# Patient Record
Sex: Male | Born: 1970 | Race: Black or African American | Hispanic: No | Marital: Single | Smoking: Never smoker
Health system: Southern US, Community
[De-identification: ages and names within clinical notes are randomized; demographics above are authoritative.]

## PROBLEM LIST (undated history)

## (undated) DIAGNOSIS — I10 Essential (primary) hypertension: Secondary | ICD-10-CM

## (undated) DIAGNOSIS — G4730 Sleep apnea, unspecified: Secondary | ICD-10-CM

## (undated) DIAGNOSIS — K219 Gastro-esophageal reflux disease without esophagitis: Secondary | ICD-10-CM

## (undated) DIAGNOSIS — F419 Anxiety disorder, unspecified: Secondary | ICD-10-CM

## (undated) DIAGNOSIS — E781 Pure hyperglyceridemia: Secondary | ICD-10-CM

## (undated) DIAGNOSIS — I77819 Aortic ectasia, unspecified site: Secondary | ICD-10-CM

## (undated) DIAGNOSIS — T7840XA Allergy, unspecified, initial encounter: Secondary | ICD-10-CM

## (undated) DIAGNOSIS — R000 Tachycardia, unspecified: Secondary | ICD-10-CM

## (undated) DIAGNOSIS — E119 Type 2 diabetes mellitus without complications: Secondary | ICD-10-CM

## (undated) DIAGNOSIS — K760 Fatty (change of) liver, not elsewhere classified: Secondary | ICD-10-CM

## (undated) DIAGNOSIS — I2510 Atherosclerotic heart disease of native coronary artery without angina pectoris: Secondary | ICD-10-CM

## (undated) DIAGNOSIS — I4711 Inappropriate sinus tachycardia, so stated: Secondary | ICD-10-CM

## (undated) HISTORY — DX: Pure hyperglyceridemia: E78.1

## (undated) HISTORY — DX: Anxiety disorder, unspecified: F41.9

## (undated) HISTORY — DX: Allergy, unspecified, initial encounter: T78.40XA

## (undated) HISTORY — DX: Inappropriate sinus tachycardia, so stated: I47.11

## (undated) HISTORY — PX: WISDOM TOOTH EXTRACTION: SHX21

## (undated) HISTORY — DX: Sleep apnea, unspecified: G47.30

## (undated) HISTORY — DX: Gastro-esophageal reflux disease without esophagitis: K21.9

## (undated) HISTORY — DX: Type 2 diabetes mellitus without complications: E11.9

## (undated) HISTORY — PX: COLONOSCOPY: SHX174

## (undated) HISTORY — DX: Fatty (change of) liver, not elsewhere classified: K76.0

## (undated) HISTORY — DX: Tachycardia, unspecified: R00.0

## (undated) HISTORY — DX: Atherosclerotic heart disease of native coronary artery without angina pectoris: I25.10

## (undated) HISTORY — DX: Aortic ectasia, unspecified site: I77.819

## (undated) HISTORY — DX: Morbid (severe) obesity due to excess calories: E66.01

---
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
## 2018-11-06 ENCOUNTER — Inpatient Hospital Stay (HOSPITAL_COMMUNITY)
Admission: EM | Admit: 2018-11-06 | Discharge: 2018-11-10 | DRG: 641 | Disposition: A | Discharge status: Home / Self Care | Payer: BC Managed Care – PPO | Attending: Internal Medicine | Admitting: Internal Medicine

## 2018-11-06 ENCOUNTER — Emergency Department (HOSPITAL_COMMUNITY): Payer: BC Managed Care – PPO

## 2018-11-06 ENCOUNTER — Other Ambulatory Visit: Payer: Self-pay

## 2018-11-06 DIAGNOSIS — J029 Acute pharyngitis, unspecified: Secondary | ICD-10-CM | POA: Diagnosis not present

## 2018-11-06 DIAGNOSIS — F419 Anxiety disorder, unspecified: Secondary | ICD-10-CM | POA: Diagnosis present

## 2018-11-06 DIAGNOSIS — I209 Angina pectoris, unspecified: Secondary | ICD-10-CM

## 2018-11-06 DIAGNOSIS — R079 Chest pain, unspecified: Secondary | ICD-10-CM | POA: Diagnosis present

## 2018-11-06 DIAGNOSIS — R0602 Shortness of breath: Secondary | ICD-10-CM | POA: Diagnosis not present

## 2018-11-06 DIAGNOSIS — Z8249 Family history of ischemic heart disease and other diseases of the circulatory system: Secondary | ICD-10-CM

## 2018-11-06 DIAGNOSIS — Z79899 Other long term (current) drug therapy: Secondary | ICD-10-CM

## 2018-11-06 DIAGNOSIS — R000 Tachycardia, unspecified: Secondary | ICD-10-CM | POA: Diagnosis present

## 2018-11-06 DIAGNOSIS — Z6841 Body Mass Index (BMI) 40.0 and over, adult: Secondary | ICD-10-CM

## 2018-11-06 DIAGNOSIS — N179 Acute kidney failure, unspecified: Secondary | ICD-10-CM | POA: Diagnosis present

## 2018-11-06 DIAGNOSIS — H538 Other visual disturbances: Secondary | ICD-10-CM | POA: Diagnosis present

## 2018-11-06 DIAGNOSIS — Z20828 Contact with and (suspected) exposure to other viral communicable diseases: Secondary | ICD-10-CM | POA: Diagnosis present

## 2018-11-06 DIAGNOSIS — N289 Disorder of kidney and ureter, unspecified: Secondary | ICD-10-CM

## 2018-11-06 DIAGNOSIS — I951 Orthostatic hypotension: Secondary | ICD-10-CM | POA: Diagnosis present

## 2018-11-06 DIAGNOSIS — E860 Dehydration: Secondary | ICD-10-CM | POA: Diagnosis not present

## 2018-11-06 DIAGNOSIS — I10 Essential (primary) hypertension: Secondary | ICD-10-CM | POA: Diagnosis present

## 2018-11-06 DIAGNOSIS — I2510 Atherosclerotic heart disease of native coronary artery without angina pectoris: Secondary | ICD-10-CM | POA: Diagnosis present

## 2018-11-06 HISTORY — DX: Essential (primary) hypertension: I10

## 2018-11-06 LAB — CBC
HCT: 42.9 % (ref 39.0–52.0)
Hemoglobin: 14.5 g/dL (ref 13.0–17.0)
MCH: 30.3 pg (ref 26.0–34.0)
MCHC: 33.8 g/dL (ref 30.0–36.0)
MCV: 89.7 fL (ref 80.0–100.0)
Platelets: 172 10*3/uL (ref 150–400)
RBC: 4.78 MIL/uL (ref 4.22–5.81)
RDW: 13 % (ref 11.5–15.5)
WBC: 10.6 10*3/uL — ABNORMAL HIGH (ref 4.0–10.5)
nRBC: 0 % (ref 0.0–0.2)

## 2018-11-06 IMAGING — DX CHEST  1 VIEW
2 series · 2 of 2 positions shown · non-contrast
Comparison: None.

CLINICAL DATA: Chest pain

EXAM:
CHEST  1 VIEW

[chest ap]
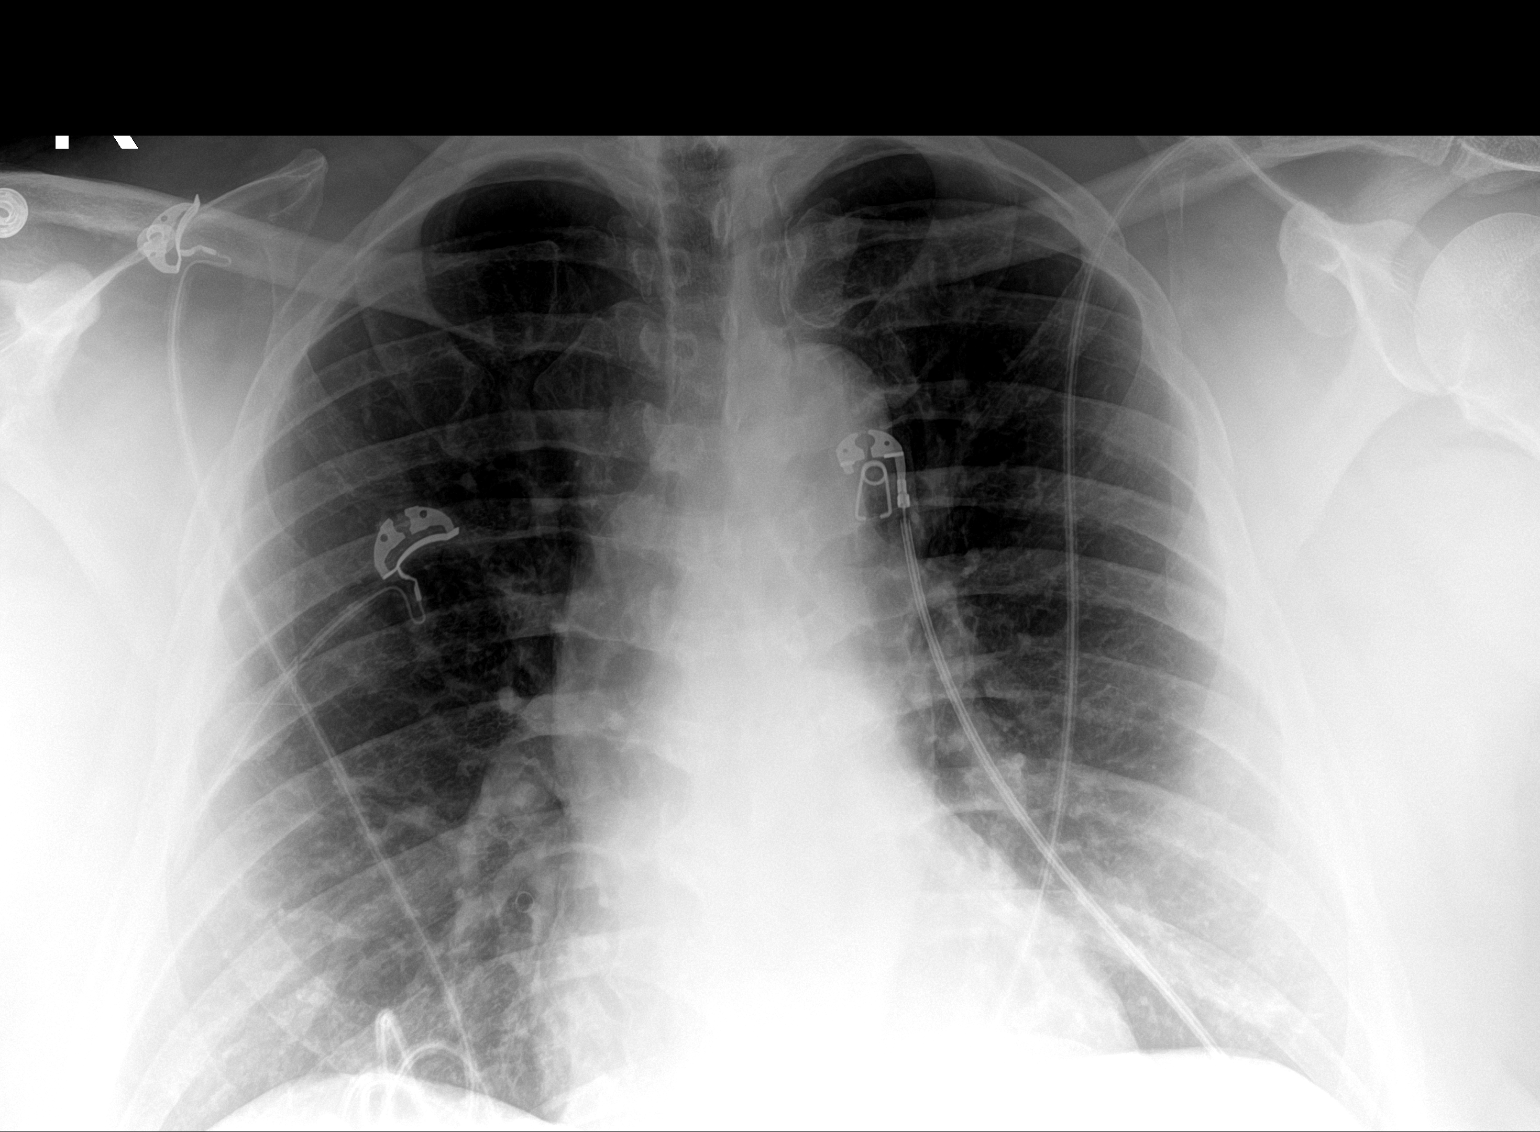

[chest pa]
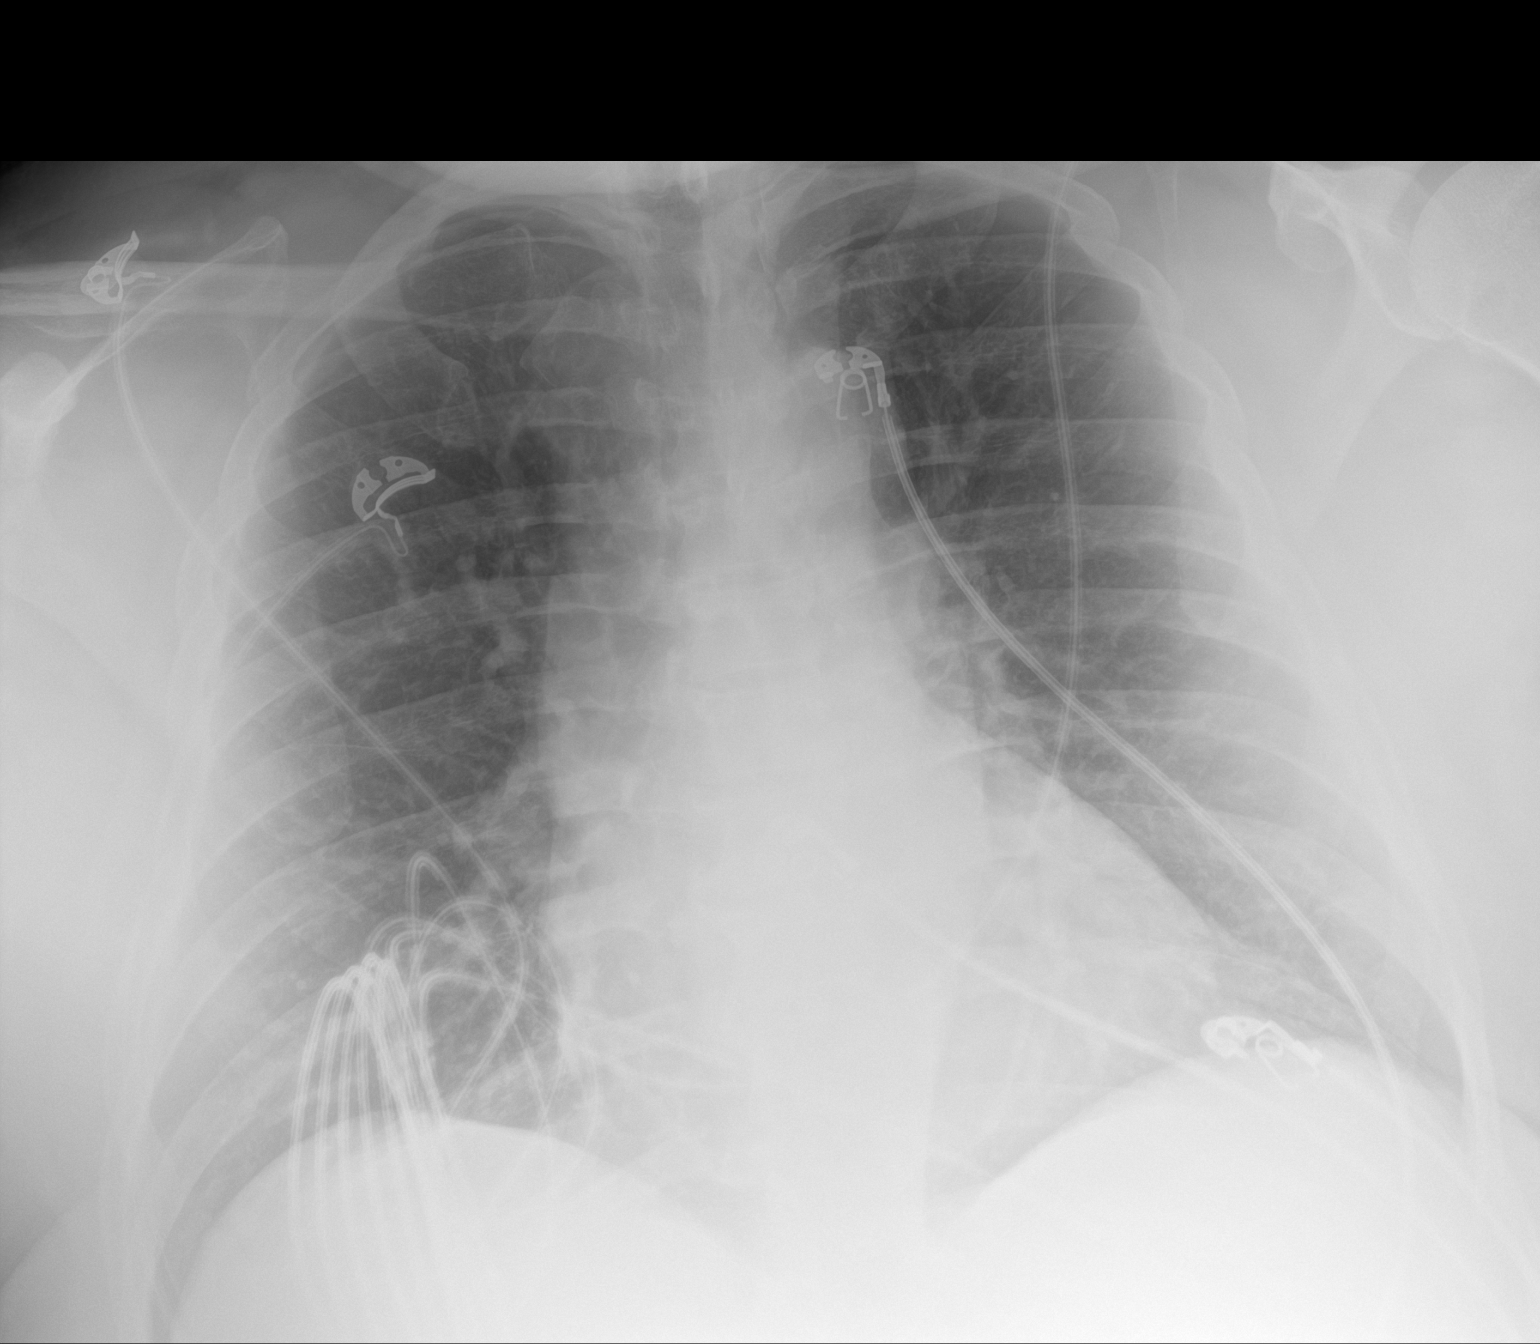

[2 of 2 positions shown; findings below may reference images not displayed]

FINDINGS: The heart size and mediastinal contours are within normal limits.
Both lungs are clear. The visualized skeletal structures are
unremarkable.
IMPRESSION: No active disease.

## 2018-11-06 MED ORDER — ADENOSINE 6 MG/2ML IV SOLN
6.0000 mg | Freq: Once | INTRAVENOUS | Status: AC
  Administered 2018-11-06: 6 mg via INTRAVENOUS
  Filled 2018-11-06: qty 2

## 2018-11-06 MED ORDER — SODIUM CHLORIDE 0.9 % IV BOLUS (SEPSIS)
500.0000 mL | Freq: Once | INTRAVENOUS | Status: AC
  Administered 2018-11-07: 500 mL via INTRAVENOUS

## 2018-11-06 MED ORDER — SODIUM CHLORIDE 0.9 % IV BOLUS (SEPSIS)
500.0000 mL | Freq: Once | INTRAVENOUS | Status: AC
  Administered 2018-11-06: 23:00:00 500 mL via INTRAVENOUS

## 2018-11-06 MED ORDER — ASPIRIN 81 MG PO CHEW
324.0000 mg | CHEWABLE_TABLET | Freq: Once | ORAL | Status: AC
  Administered 2018-11-07: 324 mg via ORAL
  Filled 2018-11-06: qty 4

## 2018-11-06 NOTE — ED Triage Notes (Signed)
Pt sts he's had some shob today then today at work had non radiating substernal chest pain. 324 ASA and 2 SLN given PTA with improvement. 140 ST with EMS. Just recently started on Cardizem.

## 2018-11-06 NOTE — ED Provider Notes (Signed)
[facility] EMERGENCY DEPARTMENT Provider Note   CSN: [id number] Arrival date & time: 11/06/18  2204    History   Chief Complaint Chief Complaint  Patient presents with  . Chest Pain    HPI Rodney Lane is a 48 y.o. male.     Patient is a 48 year old obese African-American male with past medical history of hypertension who presents the emergency department for shortness of breath and chest pain.  Patient reports over the past several weeks he has been coming more short of breath with exertion.  He did see urgent care and was started on new blood pressure medications and was supposed to have lab work-up but results are pending.  Patient reports that all of a sudden today while he was sitting in the break room at work he began to become increasingly short of breath with palpitations and chest pain.  Patient reports that it feels like someone is sitting on his chest.  Patient reports the pain is 5 out of 10.  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  Reports that with just short distances over the last couple weeks he has become more more short of breath.  Denies leg swelling or recent surgery.  No known cardiac disease but has family history     No past medical history on file.  There are no active problems to display for this patient.         Home Medications    Prior to Admission medications   Not on File    Family History No family history on file.  Social History Social History   Tobacco Use  . Smoking status: Not on file  Substance Use Topics  . Alcohol use: Not on file  . Drug use: Not on file     Allergies   Patient has no known allergies.   Review of Systems Review of Systems  Constitutional: Negative.   HENT: Negative for congestion.   Respiratory: Positive for shortness of breath. Negative for cough and chest tightness.   Cardiovascular: Positive for chest pain and palpitations. Negative for  leg swelling.  Gastrointestinal: Negative.   Musculoskeletal: Negative for arthralgias and back pain.  Skin: Negative for rash.  Allergic/Immunologic: Negative for immunocompromised state.  Neurological: Negative for dizziness, light-headedness and headaches.     Physical Exam Updated Vital Signs BP 111/63 (BP Location: Right Arm)   Pulse (!) 149   Temp 99.4 F (37.4 C) (Oral)   Resp (!) 21   Ht 5\' 9"  (1.753 m)   Wt 127 kg   SpO2 95%   BMI 41.35 kg/m   Physical Exam Constitutional:      General: He is not in acute distress.    Appearance: He is well-developed. He is obese. He is diaphoretic. He is not ill-appearing or toxic-appearing.  HENT:     Head: Normocephalic and atraumatic.  Eyes:     Pupils: Pupils are equal, round, and reactive to light.  Cardiovascular:     Rate and Rhythm: Tachycardia present.  Pulmonary:     Effort: Pulmonary effort is normal.     Breath sounds: Normal breath sounds.  Chest:     Chest wall: No mass or deformity.  Abdominal:     General: Bowel sounds are normal.     Palpations: Abdomen is soft.  Neurological:     Mental Status: He is alert.  Psychiatric:        Mood and Affect:  Mood normal.      ED Treatments / Results  Labs (all labs ordered are listed, but only abnormal results are displayed) Labs Reviewed  BASIC METABOLIC PANEL  MAGNESIUM  CBC  TROPONIN I    EKG EKG Interpretation  Date/Time:  Wednesday November 06 2018 22:14:41 EDT Ventricular Rate:  147 PR Interval:    QRS Duration: 87 QT Interval:  270 QTC Calculation: 423 R Axis:   50 Text Interpretation:  Sinus tachycardia Nonspecific ST and T wave abnormality No old tracing to compare Confirmed by Varney Biles [phone]) on 11/06/2018 10:22:16 PM   Radiology No results found.  Procedures Procedures (including critical care time)  Medications Ordered in ED Medications  sodium chloride 0.9 % bolus 500 mL (has no administration in time range)  adenosine  (ADENOCARD) 6 MG/2ML injection 6 mg (has no administration in time range)     Initial Impression / Assessment and Plan / ED Course  I have reviewed the triage vital signs and the nursing notes.  Pertinent labs & imaging results that were available during my care of the patient were reviewed by me and considered in my medical decision making (see chart for details).  Clinical Course as of Nov 05 2341  Wed Nov 06, 2018  2250 Obese male with past medical history of hypertension here for acute onset of chest pain, shortness of breath, palpitations.  Heart rate is steady at 149.  Blood pressure 119/69. Patient was given 6mg  of adenosine without any change. EKG appears more or less likely sinus tachy. Will consult with cardiology.   [KM]  2325 Consulted with cardiology who thought EKG was definitely sinus tach and no need for further antiarrythmic.    [KM]    Clinical Course User Index [KM] Alveria Apley, PA-C       Patient care handed to Montine Circle PA-C due to change of shift  Final Clinical Impressions(s) / ED Diagnoses   Final diagnoses:  None    ED Discharge Orders         Ordered    Amb referral to [facility]     11/06/18 2238           Kristine Royal 11/06/18 Nelva Bush, MD 11/07/18 1811

## 2018-11-07 ENCOUNTER — Emergency Department (HOSPITAL_COMMUNITY): Payer: BC Managed Care – PPO

## 2018-11-07 ENCOUNTER — Encounter (HOSPITAL_COMMUNITY): Payer: Self-pay | Admitting: Radiology

## 2018-11-07 ENCOUNTER — Observation Stay (HOSPITAL_BASED_OUTPATIENT_CLINIC_OR_DEPARTMENT_OTHER): Payer: BC Managed Care – PPO

## 2018-11-07 DIAGNOSIS — R0602 Shortness of breath: Secondary | ICD-10-CM

## 2018-11-07 DIAGNOSIS — R9431 Abnormal electrocardiogram [ECG] [EKG]: Secondary | ICD-10-CM

## 2018-11-07 DIAGNOSIS — N289 Disorder of kidney and ureter, unspecified: Secondary | ICD-10-CM

## 2018-11-07 DIAGNOSIS — R079 Chest pain, unspecified: Secondary | ICD-10-CM | POA: Diagnosis present

## 2018-11-07 DIAGNOSIS — I10 Essential (primary) hypertension: Secondary | ICD-10-CM | POA: Diagnosis present

## 2018-11-07 DIAGNOSIS — Z79899 Other long term (current) drug therapy: Secondary | ICD-10-CM | POA: Diagnosis not present

## 2018-11-07 DIAGNOSIS — I2510 Atherosclerotic heart disease of native coronary artery without angina pectoris: Secondary | ICD-10-CM | POA: Diagnosis present

## 2018-11-07 DIAGNOSIS — I951 Orthostatic hypotension: Secondary | ICD-10-CM | POA: Diagnosis present

## 2018-11-07 DIAGNOSIS — H538 Other visual disturbances: Secondary | ICD-10-CM | POA: Diagnosis present

## 2018-11-07 DIAGNOSIS — R000 Tachycardia, unspecified: Secondary | ICD-10-CM | POA: Diagnosis present

## 2018-11-07 DIAGNOSIS — Z20828 Contact with and (suspected) exposure to other viral communicable diseases: Secondary | ICD-10-CM | POA: Diagnosis present

## 2018-11-07 DIAGNOSIS — E860 Dehydration: Secondary | ICD-10-CM | POA: Diagnosis present

## 2018-11-07 DIAGNOSIS — Z6841 Body Mass Index (BMI) 40.0 and over, adult: Secondary | ICD-10-CM | POA: Diagnosis not present

## 2018-11-07 DIAGNOSIS — J029 Acute pharyngitis, unspecified: Secondary | ICD-10-CM | POA: Diagnosis not present

## 2018-11-07 DIAGNOSIS — N179 Acute kidney failure, unspecified: Secondary | ICD-10-CM | POA: Diagnosis present

## 2018-11-07 DIAGNOSIS — F419 Anxiety disorder, unspecified: Secondary | ICD-10-CM | POA: Diagnosis present

## 2018-11-07 DIAGNOSIS — Z8249 Family history of ischemic heart disease and other diseases of the circulatory system: Secondary | ICD-10-CM | POA: Diagnosis not present

## 2018-11-07 HISTORY — DX: Chest pain, unspecified: R07.9

## 2018-11-07 LAB — ECHOCARDIOGRAM COMPLETE
Height: 69 in
Weight: 4480 oz

## 2018-11-07 LAB — TROPONIN I
Troponin I: <0.03 ng/mL (ref <0.03)
Troponin I: <0.03 ng/mL (ref <0.03)
Troponin I: <0.03 ng/mL (ref <0.03)
Troponin I: <0.03 ng/mL (ref <0.03)

## 2018-11-07 LAB — RAPID URINE DRUG SCREEN, HOSP PERFORMED
Amphetamines: NOT DETECTED
Barbiturates: NOT DETECTED
Benzodiazepines: NOT DETECTED
Cocaine: NOT DETECTED
Opiates: NOT DETECTED
Tetrahydrocannabinol: NOT DETECTED

## 2018-11-07 LAB — PROCALCITONIN: Procalcitonin: 0.16 ng/mL

## 2018-11-07 LAB — MAGNESIUM: Magnesium: 1.9 mg/dL (ref 1.7–2.4)

## 2018-11-07 LAB — BASIC METABOLIC PANEL
Anion gap: 16 — ABNORMAL HIGH (ref 5–15)
BUN: 21 mg/dL — ABNORMAL HIGH (ref 6–20)
CO2: 20 mmol/L — ABNORMAL LOW (ref 22–32)
Calcium: 9.7 mg/dL (ref 8.9–10.3)
Chloride: 100 mmol/L (ref 98–111)
Creatinine, Ser: 1.51 mg/dL — ABNORMAL HIGH (ref 0.61–1.24)
GFR calc Af Amer: >60 mL/min (ref >60)
GFR calc non Af Amer: 54 mL/min — ABNORMAL LOW (ref >60)
Glucose, Bld: 121 mg/dL — ABNORMAL HIGH (ref 70–99)
Potassium: 4.1 mmol/L (ref 3.5–5.1)
Sodium: 136 mmol/L (ref 135–145)

## 2018-11-07 LAB — HIV ANTIBODY (ROUTINE TESTING W REFLEX): HIV Screen 4th Generation wRfx: NONREACTIVE

## 2018-11-07 LAB — TSH: TSH: 2.4 u[IU]/mL (ref 0.350–4.500)

## 2018-11-07 LAB — CREATININE, SERUM
Creatinine, Ser: 1.5 mg/dL — ABNORMAL HIGH (ref 0.61–1.24)
GFR calc Af Amer: >60 mL/min (ref >60)
GFR calc non Af Amer: 54 mL/min — ABNORMAL LOW (ref >60)

## 2018-11-07 LAB — D-DIMER, QUANTITATIVE: D-Dimer, Quant: 0.56 ug/mL-FEU — ABNORMAL HIGH (ref 0.00–0.50)

## 2018-11-07 LAB — SARS CORONAVIRUS 2: SARS Coronavirus 2: NOT DETECTED

## 2018-11-07 IMAGING — CT CT ANGIOGRAPHY CHEST
2 of 7 series · 18 of 46 positions shown · IV contrast (APPLIED)
Comparison: Portable chest [DATE].

CLINICAL DATA: 48-year-old male with chest pain shortness of breath
and diaphoresis.

EXAM:
CT ANGIOGRAPHY CHEST WITH CONTRAST
TECHNIQUE: Multidetector CT imaging of the chest was performed using the
standard protocol during bolus administration of intravenous
contrast. Multiplanar CT image reconstructions and MIPs were
obtained to evaluate the vascular anatomy.
CONTRAST:  70 milliliters OMNIPAQUE IOHEXOL 350 MG/ML SOLN

[Series 7: thins · axial · 0.91mm/px · z∈[+1268,+1565]mm · 15 of 477 slices shown]
[im 27/477  lung]
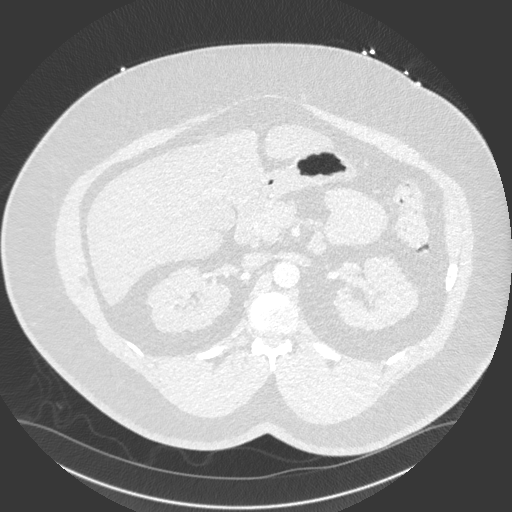
[im 53/477  soft-tissue]
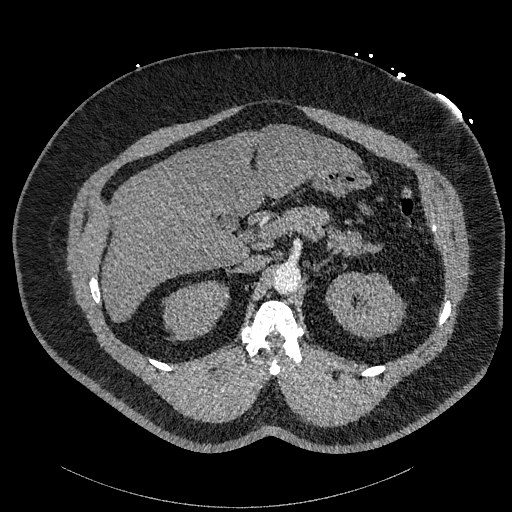
[im 80/477  lung]
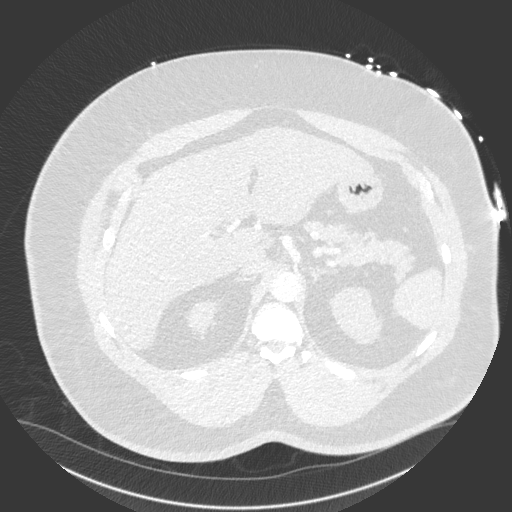
[im 106/477  soft-tissue]
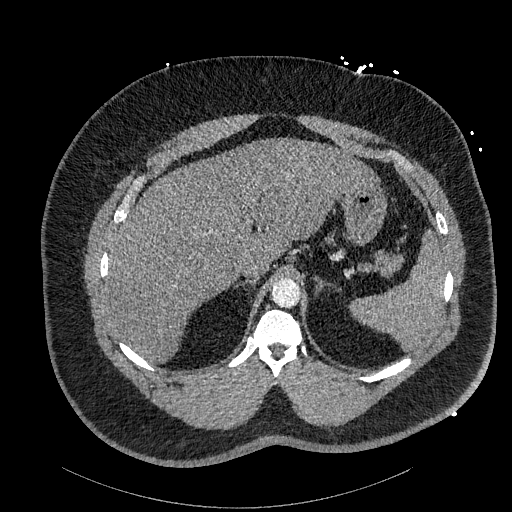
[im 159/477  lung]
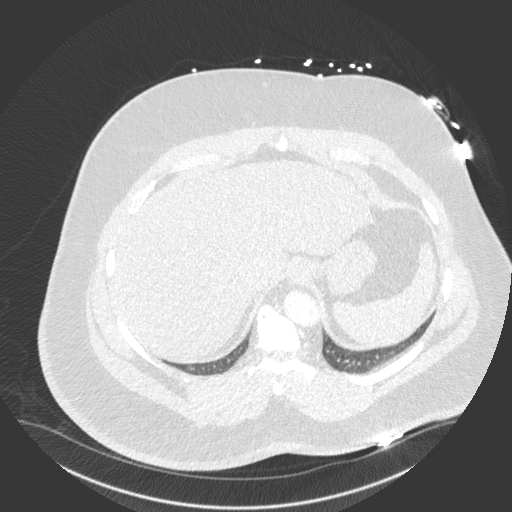
[im 186/477  soft-tissue]
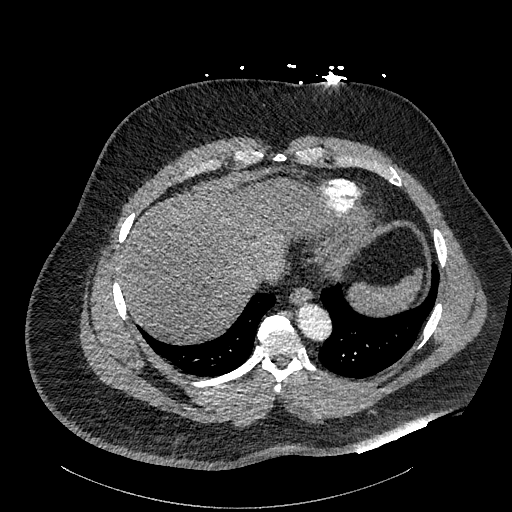
[im 212/477  lung]
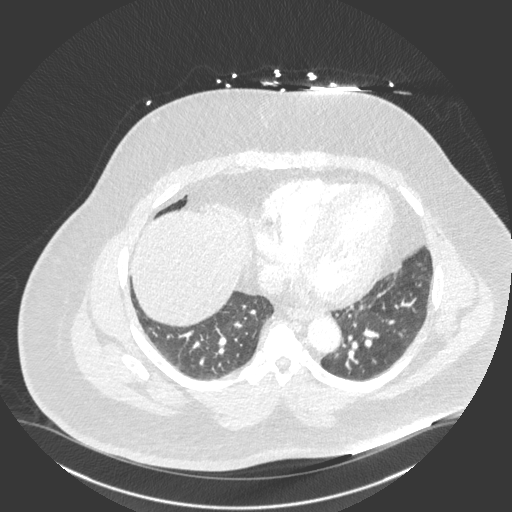
[im 239/477  soft-tissue]
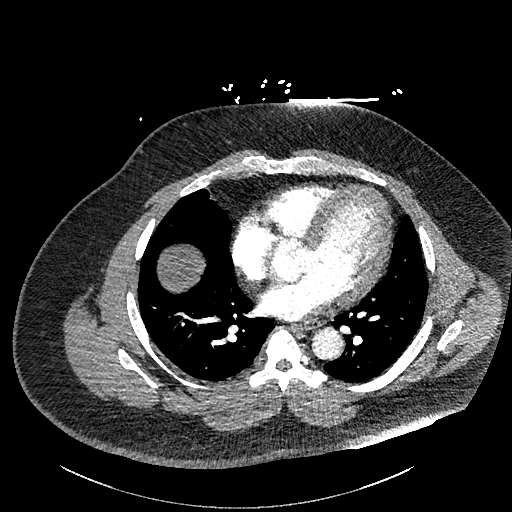
[im 265/477  lung]
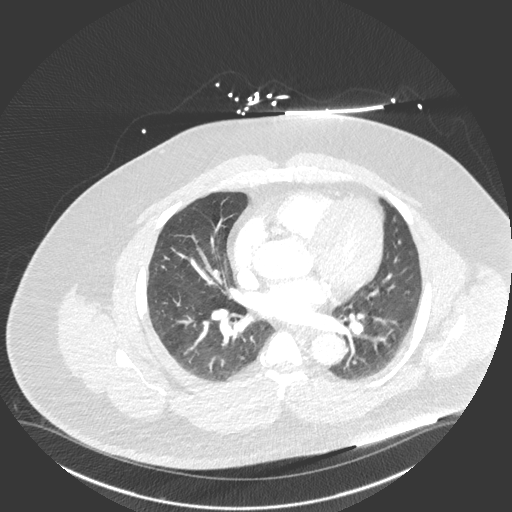
[im 291/477  soft-tissue]
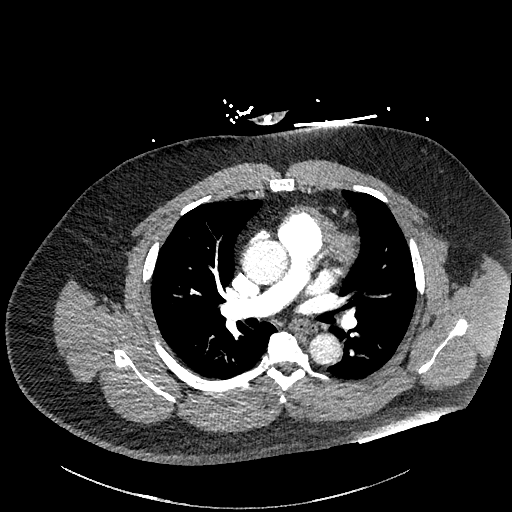
[im 318/477  lung]
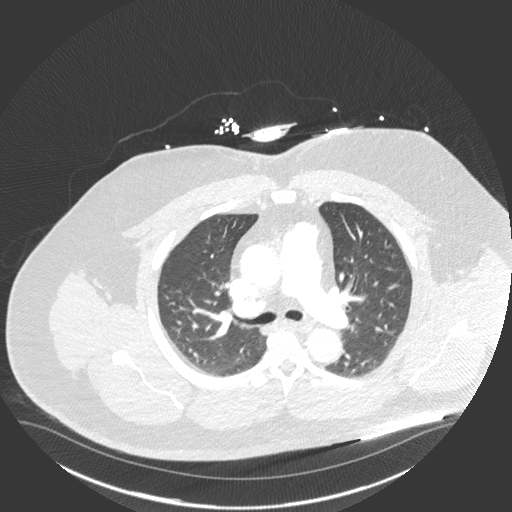
[im 371/477  soft-tissue]
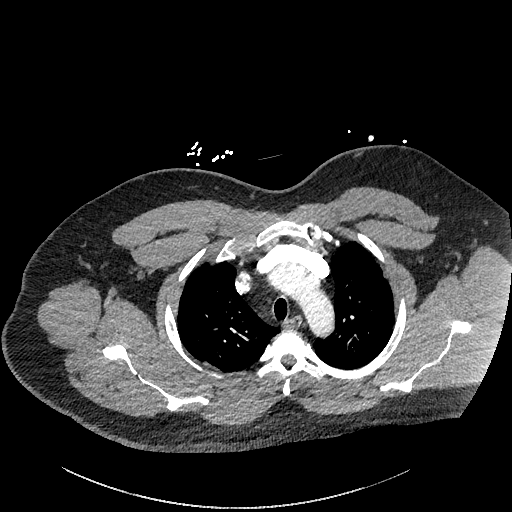
[im 397/477  lung]
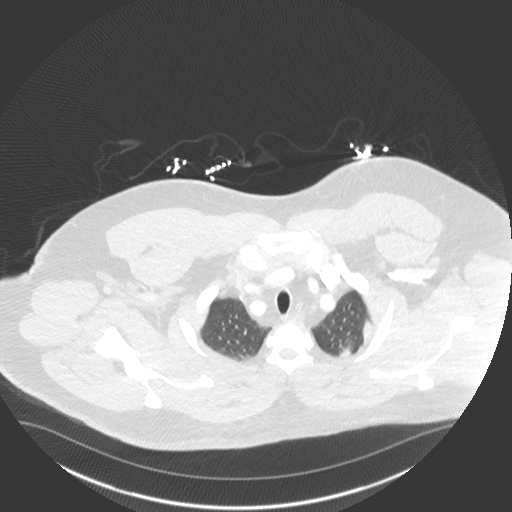
[im 424/477  soft-tissue]
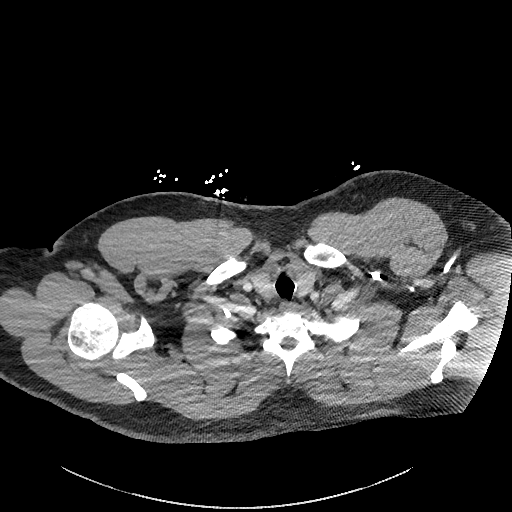
[im 450/477  lung]
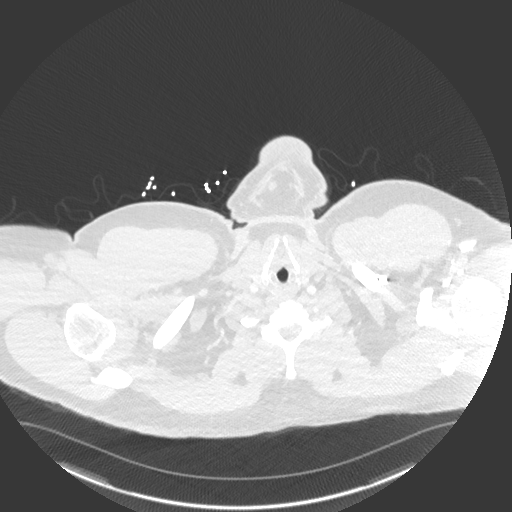

[Series 8: cor · coronal · 0.65mm/px · 3 of 192 slices shown]
[im 48/192  soft-tissue]
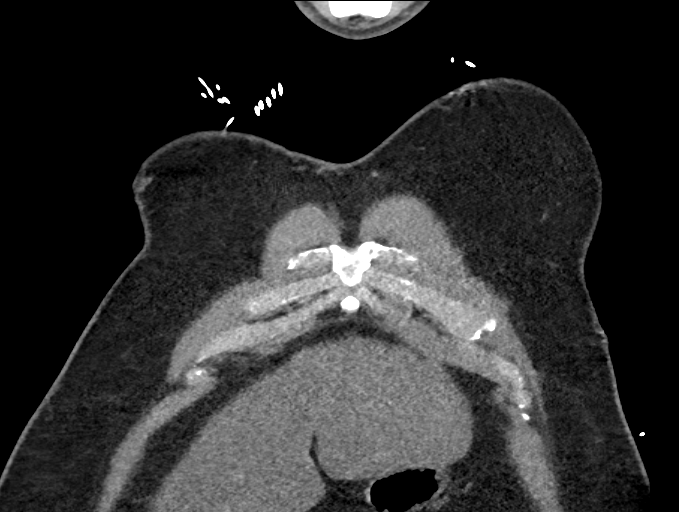
[im 96/192  soft-tissue]
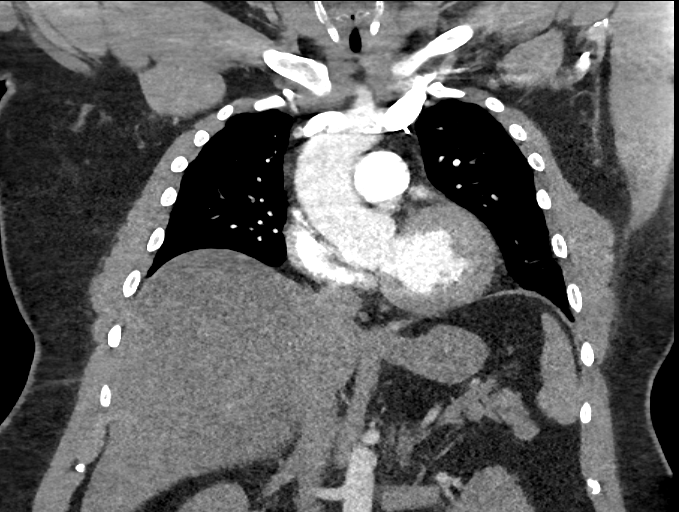
[im 144/192  soft-tissue]
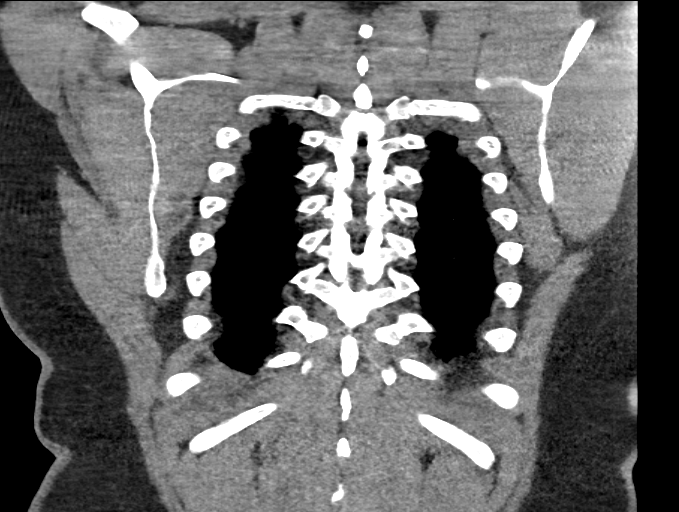

[18 of 46 positions shown; findings below may reference images not displayed]

FINDINGS: Cardiovascular: Excellent contrast bolus timing in the pulmonary
arterial tree. Mild streak artifact in the chest.

No focal filling defect identified in the pulmonary arteries to
suggest acute pulmonary embolism.

Evidence of calcified coronary artery atherosclerosis on series 7,
image 180. Mild cardiomegaly. No pericardial effusion. Negative
visible aorta.

Mediastinum/Nodes: Negative. No lymphadenopathy.

Lungs/Pleura: Major airways are patent. Minimal pulmonary
atelectasis. No pleural effusion or other abnormal pulmonary
opacity.

Upper Abdomen: Hepatic steatosis. Negative visible gallbladder,
spleen, pancreas, adrenal glands, kidneys, and bowel.

Musculoskeletal: Negative.

Review of the MIP images confirms the above findings.
IMPRESSION: 1. Negative for acute pulmonary embolus.
2. Calcified coronary artery atherosclerosis. Mild cardiomegaly. No
pericardial effusion.
3. Minimal pulmonary atelectasis.
4. Hepatic steatosis.

## 2018-11-07 MED ORDER — HEPARIN SODIUM (PORCINE) 5000 UNIT/ML IJ SOLN
5000.0000 [IU] | Freq: Three times a day (TID) | INTRAMUSCULAR | Status: DC
  Administered 2018-11-07 – 2018-11-10 (×9): 5000 [IU] via SUBCUTANEOUS
  Filled 2018-11-07 (×9): qty 1

## 2018-11-07 MED ORDER — ACETAMINOPHEN 325 MG PO TABS
650.0000 mg | ORAL_TABLET | ORAL | Status: DC | PRN
  Administered 2018-11-07: 650 mg via ORAL
  Filled 2018-11-07: qty 2

## 2018-11-07 MED ORDER — ONDANSETRON HCL 4 MG/2ML IJ SOLN: 4.0000 mg | Freq: Four times a day (QID) | INTRAMUSCULAR | Status: DC | PRN

## 2018-11-07 MED ORDER — MORPHINE SULFATE (PF) 2 MG/ML IV SOLN: 2.0000 mg | INTRAVENOUS | Status: DC | PRN

## 2018-11-07 MED ORDER — DILTIAZEM HCL ER COATED BEADS 120 MG PO TB24
120.0000 mg | ORAL_TABLET | Freq: Every day | ORAL | Status: DC
  Administered 2018-11-07: 120 mg via ORAL
  Filled 2018-11-07 (×2): qty 1

## 2018-11-07 MED ORDER — SODIUM CHLORIDE 0.9 % IV BOLUS
1000.0000 mL | Freq: Once | INTRAVENOUS | Status: AC
  Administered 2018-11-06: 23:00:00 1000 mL via INTRAVENOUS

## 2018-11-07 MED ORDER — IOHEXOL 350 MG/ML SOLN
100.0000 mL | Freq: Once | INTRAVENOUS | Status: AC | PRN
  Administered 2018-11-07: 01:00:00 100 mL via INTRAVENOUS

## 2018-11-07 MED ORDER — METOPROLOL TARTRATE 12.5 MG HALF TABLET: 12.5000 mg | ORAL_TABLET | Freq: Two times a day (BID) | ORAL | Status: DC

## 2018-11-07 MED ORDER — METOPROLOL TARTRATE 50 MG PO TABS
50.0000 mg | ORAL_TABLET | Freq: Two times a day (BID) | ORAL | Status: DC
  Administered 2018-11-07 – 2018-11-08 (×2): 50 mg via ORAL
  Filled 2018-11-07 (×3): qty 1

## 2018-11-07 MED ORDER — NITROGLYCERIN 0.1 MG/HR TD PT24
0.1000 mg | MEDICATED_PATCH | Freq: Every day | TRANSDERMAL | Status: DC
  Administered 2018-11-07 – 2018-11-08 (×2): 0.1 mg via TRANSDERMAL
  Filled 2018-11-07 (×2): qty 1

## 2018-11-07 NOTE — ED Notes (Signed)
Spoke with cardiology who stated to go ahead and order pt a diet tray, not likely to go for stress test today.

## 2018-11-07 NOTE — Consult Note (Addendum)
Cardiology Consultation:   Patient ID: Rodney Lane; [id number]; [birth year]   Admit date: 11/06/2018 Date of Consult: 11/07/2018  Primary Care Provider: System, Pcp Not In Primary Cardiologist: No primary care provider on file. Primary Electrophysiologist:  None   Patient Profile:   Rodney Lane is a 48 y.o. male with a PMH of HTN, obesity, and tachycardia managed with diltiazem, who is being seen today for the evaluation of tachycardia and chest pain at the request of Dr. Earnest Conroy.  History of Present Illness:   Mr. Rabideau was in his usual state of health until a couple weeks ago when he began experiencing DOE. He sought care an an urgent care facility and was found to be hypertensive and started on amlodipine for BP control. His symptoms continued and he was noted to have sinus tachycardia for which he was prescribed diltiazem. On 11/06/2018 when he developed sudden onset shortness of breath, palpitations, and chest pressure while sitting in the break room at work. He reported associated diaphoresis. EMS was activated and he states his SBP was elevated to the 160s. He was brought to the ED for further evaluation.    He denies prior heart disease history. He states he has had trouble with his cholesterol and pre-DM, though he is not on any medications and has been managing these with dietary modifications. He reports both grandfathers died from MI's (one in his 63s, one in his 33s), otherwise no significant family history of CAD. He reported having a similar experience with SOB and CP a couple years ago which was evaluated at the hospital in [place]. He underwent a stress test and an echocardiogram at that time which he reports were normal. He reported having a bad reaction to the lexiscan medication and states he would not want to go through that again.   At the time of this evaluation, he continues to feel some chest pressure which is improved with the nitro patch. He denies current  palpitations or feeling his heart race, though his HR was in the 120s at that time. He states his DOE has progressed over the past couple weeks and he has to stop for rest even when walking to his mailbox. He reports intermittent chest pressure which occurs once per week, lasts for a couple minutes, and resolves spontaneously. He denies orthopnea, PND, LE edema, weight gain, dizziness, lightheadedness, or syncope.   Hospital course: tachycardic to the 140s on arrival which improved to 110s after 2L NS, intermittently tachypneic, and intermittently hypertensive; otherwise VSS. Labs notable for electrolytes wnl, Cr 1.5 (no baseline), WBC 10.6, Hgb 14.5, PLT 172, TSH 2.4, Ddimer 0.56, Utox negative, COVID-19 negative, procal 0.16. CTA Chest without PE or aortic dissection. CXR without acute findings. EKG with sinus tachycardia, rate 147, non-specific T wave abnormalities, no STE/D, no TWI. Echo pending. Trial adenosine in the ED without change in HR. HR improved to the 110s after 2L IVF. He was admitted to medicine and started on a nitro patch given persistent chest pain. Cardiology asked to evaluate  Past Medical History:  Diagnosis Date  . HTN (hypertension)   . Tachycardia    Not sure why it goes fast, but someone put him on cardizem.  Appears to be S.tach    History reviewed. No pertinent surgical history.   Home Medications:  Prior to Admission medications   Medication Sig Start Date End Date Taking? Authorizing Provider  albuterol (VENTOLIN HFA) 108 (90 Base) MCG/ACT inhaler Inhale 1-2 puffs into the lungs every 6 (  six) hours as needed for wheezing or shortness of breath.   Yes [provider]  amLODipine (NORVASC) 5 MG tablet Take 5 mg by mouth daily.  10/25/18  Yes [provider]  CARDIZEM LA 120 MG 24 hr tablet Take 120 mg by mouth daily. 11/01/18  Yes [provider]  cetirizine (ZYRTEC) 10 MG tablet Take 10 mg by mouth daily. 10/25/18  Yes [provider]   hydrochlorothiazide (HYDRODIURIL) 25 MG tablet Take 25 mg by mouth daily. 10/25/18  Yes [provider]    Inpatient Medications: Scheduled Meds: . diltiazem  120 mg Oral Daily  . heparin  5,000 Units Subcutaneous Q8H  . nitroGLYCERIN  0.1 mg Transdermal Daily   Continuous Infusions:  PRN Meds: acetaminophen, morphine injection, ondansetron (ZOFRAN) IV  Allergies:   No Known Allergies  Social History:   Social History   Socioeconomic History  . Marital status: Single    Spouse name: Not on file  . Number of children: Not on file  . Years of education: Not on file  . Highest education level: Not on file  Occupational History  . Not on file  Social Needs  . Financial resource strain: Not on file  . Food insecurity    Worry: Not on file    Inability: Not on file  . Transportation needs    Medical: Not on file    Non-medical: Not on file  Tobacco Use  . Smoking status: Never Smoker  Substance and Sexual Activity  . Alcohol use: Never    Frequency: Never  . Drug use: Never  . Sexual activity: Not on file  Lifestyle  . Physical activity    Days per week: Not on file    Minutes per session: Not on file  . Stress: Not on file  Relationships  . Social Herbalist on phone: Not on file    Gets together: Not on file    Attends religious service: Not on file    Active member of club or organization: Not on file    Attends meetings of clubs or organizations: Not on file    Relationship status: Not on file  . Intimate partner violence    Fear of current or ex partner: Not on file    Emotionally abused: Not on file    Physically abused: Not on file    Forced sexual activity: Not on file  Other Topics Concern  . Not on file  Social History Narrative  . Not on file    Family History:    Family History  Problem Relation Age of Onset  . Cancer Mother   . Cancer Father   . Diabetes Father   . Heart disease Maternal Grandfather   . Heart disease  Paternal Grandfather      ROS:  Please see the history of present illness.   All other ROS reviewed and negative.     Physical Exam/Data:   Vitals:   11/07/18 1030 11/07/18 1045 11/07/18 1125 11/07/18 1300  BP: (!) 134/92  131/76 107/71  Pulse: (!) 112 (!) 110 (!) 116 (!) 104  Resp: (!) 29 (!) 23 (!) 22 (!) 34  Temp:      TempSrc:      SpO2: 96% 96% 98% 93%  Weight:      Height:        Intake/Output Summary (Last 24 hours) at 11/07/2018 1334 Last data filed at 11/07/2018 0250 Gross per 24 hour  Intake 1900 ml  Output -  Net 1900 ml   Filed Weights   11/06/18 2208 11/06/18 2216  Weight: 127 kg 127 kg   Body mass index is 41.35 kg/m.  General:  Well nourished, well developed, sitting on the edge of the bed in no acute distress HEENT: sclera anicteric  Neck: no JVD Vascular: No carotid bruits; distal pulses 2+ bilaterally Cardiac:  normal S1, S2; tachycardic, regular rhythm, no murmurs, rubs, or gallops Lungs:  clear to auscultation bilaterally, no wheezing, rhonchi or rales  Abd: NABS, soft, obese, nontender, no hepatomegaly Ext: no edema Musculoskeletal:  No deformities, BUE and BLE strength normal and equal Skin: warm and dry  Neuro:  CNs 2-12 intact, no focal abnormalities noted Psych:  Normal affect   EKG:  The EKG was personally reviewed and demonstrates:  sinus tachycardia, rate 147, non-specific T wave abnormalities, no STE/D, no TWI. Telemetry:  Telemetry was personally reviewed and demonstrates:  Sinus tachycardia with rate 110s-120s  Relevant CV Studies: Echocardiogram 11/07/2018:  IMPRESSIONS    1. The left ventricle has low normal systolic function, with an ejection fraction of 50-55%. The cavity size was normal. There is mildly increased left ventricular wall thickness. Left ventricular diastolic Doppler parameters are consistent with  impaired relaxation.  2. The right ventricle has normal systolic function. The cavity was normal. There is no  increase in right ventricular wall thickness.  3. The aortic valve was not well visualized.  Laboratory Data:  Chemistry Recent Labs  Lab 11/06/18 2249 11/07/18 0622  NA 136  --   K 4.1  --   CL 100  --   CO2 20*  --   GLUCOSE 121*  --   BUN 21*  --   CREATININE 1.51* 1.50*  CALCIUM 9.7  --   GFRNONAA 54* 54*  GFRAA >60 >60  ANIONGAP 16*  --     No results for input(s): PROT, ALBUMIN, AST, ALT, ALKPHOS, BILITOT in the last 168 hours. Hematology Recent Labs  Lab 11/06/18 2249  WBC 10.6*  RBC 4.78  HGB 14.5  HCT 42.9  MCV 89.7  MCH 30.3  MCHC 33.8  RDW 13.0  PLT 172   Cardiac Enzymes Recent Labs  Lab 11/06/18 2249 11/07/18 0523 11/07/18 1044  TROPONINI <0.03 <0.03 <0.03   No results for input(s): TROPIPOC in the last 168 hours.  BNPNo results for input(s): BNP, PROBNP in the last 168 hours.  DDimer  Recent Labs  Lab 11/06/18 2314  DDIMER 0.56*    Radiology/Studies:  Dg Chest 1 View  Result Date: 11/06/2018 CLINICAL DATA:  Chest pain EXAM: CHEST  1 VIEW COMPARISON:  None. FINDINGS: The heart size and mediastinal contours are within normal limits. Both lungs are clear. The visualized skeletal structures are unremarkable. IMPRESSION: No active disease. Electronically Signed   By: Donavan Foil M.D.   On: 11/06/2018 22:56   Ct Angio Chest Pe W And/or Wo Contrast  Result Date: 11/07/2018 CLINICAL DATA:  48 year old male with chest pain shortness of breath and diaphoresis. EXAM: CT ANGIOGRAPHY CHEST WITH CONTRAST TECHNIQUE: Multidetector CT imaging of the chest was performed using the standard protocol during bolus administration of intravenous contrast. Multiplanar CT image reconstructions and MIPs were obtained to evaluate the vascular anatomy. CONTRAST:  70 milliliters OMNIPAQUE IOHEXOL 350 MG/ML SOLN COMPARISON:  Portable chest 11/06/2018. FINDINGS: Cardiovascular: Excellent contrast bolus timing in the pulmonary arterial tree. Mild streak artifact in the  chest. No focal filling defect identified in the  pulmonary arteries to suggest acute pulmonary embolism. Evidence of calcified coronary artery atherosclerosis on series 7, image 180. Mild cardiomegaly. No pericardial effusion. Negative visible aorta. Mediastinum/Nodes: Negative. No lymphadenopathy. Lungs/Pleura: Major airways are patent. Minimal pulmonary atelectasis. No pleural effusion or other abnormal pulmonary opacity. Upper Abdomen: Hepatic steatosis. Negative visible gallbladder, spleen, pancreas, adrenal glands, kidneys, and bowel. Musculoskeletal: Negative. Review of the MIP images confirms the above findings. IMPRESSION: 1. Negative for acute pulmonary embolus. 2. Calcified coronary artery atherosclerosis. Mild cardiomegaly. No pericardial effusion. 3. Minimal pulmonary atelectasis. 4. Hepatic steatosis. Electronically Signed   By: Genevie Ann M.D.   On: 11/07/2018 01:05    Assessment and Plan:   1. Chest pain and DOE: He reports intermittent chest pressure which occurs once per week typically lasting a couple minutes before resolving. Also with progressive DOE for the past couple weeks. On 11/06/2018 he had an episode with associated SOB, diaphoresis, and palpitations, which lasted for hours. Trop negative x3. EKG with sinus tachycardia but non-ischemic. Echo with EF 50-55%, mild LVH, G1DD, and no significant valvular abnormalities. No prior heart disease history but was noted to have calcified coronary artery atherosclerosis on CT chest. Risk factors for CAD include HTN, presumed HLD (reports problems in the past but not on statin), presumed DM type 2 (pre-DM in past and glucose elevated this admission), suspected OSA, and obesity.  - Check HgbA1C and FLP for risk stratification - Would likely benefit from an ischemic evaluation. Patient is not interested in undergoing a NST given past experience. His body habitus is not conducive to coronary CTA. Favor holding off on invasive ischemic evaluation.  Could consider an exercise stress test once HR better controlled.   2. Sinus tachycardia: unclear etiology. Not responsive to adenosine. No evidence of infection on CXR or CT Chest. Utox negative. TSH wnl. Improved from 140s to 120s with IVFs. Had been on diltiazem outpatient. Possible he has an atrial tachycardia that originates from near the SA node.  - Psychiatrist for BP/HR control - will start metoprolol 68m BID and uptitrate as tolerated.  - Will check ESR, CPR, and ANA - Will ask EP to see - may benefit from an EP study to further evaluate tachycardia.    3. HTN: BP stable. Home amlodipine and HCTZ on hold.  - Will start metoprolol 556mBID - Further recommendations pending response to metoprolol.   4. AKI vs CKD: Cr 1.5 on presentation - no baseline history available   For questions or updates, please contact [facility] consult [email or web address] for contact info under Cardiology/STEMI.   Signed, KrAbigail ButtsPA-C  11/07/2018 1:34 PM [phone] examined chart reviewed Discussed care with patient and PA. Issue appears to be inappropriate "sinus" tachycardia. Labs are fine Hct, TSH, urine drug screen normal. Echo with low normal EF Mitral inflow pattern shows abnormal relaxation with fused E/A waves. P wave morphology on ECG is normal. No change in rhythm with adenosine in ER speaks against re-entrant rhythm. No changes on my exam with valsalva or CSM. He is obese and sedentary and may just be extreme end of bell shaped curve for normal. However I have seen abnormal ectopic atrial tachycardia with location near the normal SA node that presents like this. Diagnosis would require EP study. Have asked EP to see For now d/c calcium blockers Start lopressor 50 bid If high dose beta blocker does not help can consider Corlanor Does not need ischemic evaluation for now. R/O no acute  changes pain in chest is atypical Check ESR, ANR, CRF He has had pharmacologic  stress test in passed and will not have again due to severe reaction to ? lexiscan or adenosine HR too high for cardiac CT and clinical presentation does not warrant invasive cath. Can consider ETT/myovue when COVID restrictions lifted   [employer]

## 2018-11-07 NOTE — ED Notes (Signed)
PT IS REFUSING RECTAL TEMPERATURE.

## 2018-11-07 NOTE — ED Notes (Signed)
Phlebotomy at bedside.

## 2018-11-07 NOTE — Progress Notes (Signed)
Please see H&P from this morning. 48 y.o. male with medical history significant of HTN, obesity, tachycardia being treated with cardizem by PCP.Patient presents to the ED with c/o SOB and CP. HR in 150s, received adenosine revealing sinus tach. Now HR 105-110. Patient reports several weeks of increasing exertional dyspnea relieved by rest. Unable to walk from mailbox to home without taking a break. States had a negative stress test many years back. Patient still in ED awaiting bed assignment. States chest pain now 2/10, retrosternal, non radiating, not associated with nausea/voming. EKG NSR and trop x2 -ve. ?Angina equivalent. Cards consult pending. Echo ordered. TSH WNL. Continue asa, diltiazem . Add nitrates. Never had sleep study. +Obese abdomen. Creatinine 1.5 on presentation. ?new. HCTZ Josem Kaufmann held. SBP 120-150. Add NTP. Heparin sq for DVT prophylaxis.

## 2018-11-07 NOTE — ED Notes (Signed)
Admitting at bedside 

## 2018-11-07 NOTE — Progress Notes (Signed)
  Echocardiogram 2D Echocardiogram has been performed.  Rodney Lane 11/07/2018, 11:24 AM

## 2018-11-07 NOTE — ED Notes (Signed)
ECHO at bedside.

## 2018-11-07 NOTE — H&P (Signed)
History and Physical    Rodney Lane PPJ:[id number] DOB: 02/10/1971 DOA: 11/06/2018  PCP: System, Pcp Not In  Patient coming from: Home  I have personally briefly reviewed patient's old medical records in [place]  Chief Complaint: CP  HPI: Rodney Lane is a 48 y.o. male with medical history significant of HTN, obesity, Tachycardia being treated with cardizem by PCP.  Patient presents to the ED with c/o SOB and CP.  Patient reports several weeks of increasing DOE.  Saw UC / PCP, got started on cardizem it looks like.  Patient reports that all of a sudden today while he was sitting in the break room at work he began to become increasingly short of breath with palpitations and chest pain.  Patient reports that it feels like someone is sitting on his chest.  Patient reports the pain is 5 out of 10.  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  Reports that with just short distances over the last couple weeks he has become more more short of breath.  Denies leg swelling or recent surgery.   ED Course: Initially HR 150s S.Tach.  Adenosine attempted without any effect.  Patient given NTG, ASA, 2L NS bolus with improvement in HR now down to 110-120 and improvement in CP, now only intermittent.  Trop neg x1.  D.Dimer 0.52.  CTA chest neg for PE and visible aorta neg.  Also no pericardial effusion or other obvious acute findings to explain his presentation though he does have coronary artery calcifications.  TSH is nl.  COVID neg, no fever, WBC 10.6k, no obvious source of infection.   Review of Systems: As per HPI otherwise 10 point review of systems negative.   Past Medical History:  Diagnosis Date  . HTN (hypertension)   . Tachycardia    Not sure why it goes fast, but someone put him on cardizem.  Appears to be S.tach    History reviewed. No pertinent surgical history.   reports that he has never smoked. He does not have any smokeless tobacco  history on file. He reports that he does not drink alcohol or use drugs.  No Known Allergies  Family History  Problem Relation Age of Onset  . Cancer Mother   . Cancer Father   . Diabetes Father   . Heart disease Maternal Grandfather   . Heart disease Paternal Grandfather      Prior to Admission medications   Medication Sig Start Date End Date Taking? Authorizing Provider  albuterol (VENTOLIN HFA) 108 (90 Base) MCG/ACT inhaler Inhale 1-2 puffs into the lungs every 6 (six) hours as needed for wheezing or shortness of breath.   Yes [provider]  amLODipine (NORVASC) 5 MG tablet Take 5 mg by mouth daily.  10/25/18  Yes [provider]  CARDIZEM LA 120 MG 24 hr tablet Take 120 mg by mouth daily. 11/01/18  Yes [provider]  cetirizine (ZYRTEC) 10 MG tablet Take 10 mg by mouth daily. 10/25/18  Yes [provider]  hydrochlorothiazide (HYDRODIURIL) 25 MG tablet Take 25 mg by mouth daily. 10/25/18  Yes [provider]    Physical Exam: Vitals:   11/07/18 0316 11/07/18 0347 11/07/18 0455 11/07/18 0500  BP: 116/90 126/82 135/87 119/81  Pulse: (!) 120 (!) 121 (!) 116 (!) 117  Resp: (!) 24 (!) 27 (!) 24 (!) 23  Temp: 98.6 F (37 C)     TempSrc: Oral  SpO2: 98% 94% 97% 96%  Weight:      Height:        Constitutional: NAD, calm, comfortable Eyes: PERRL, lids and conjunctivae normal ENMT: Mucous membranes are moist. Posterior pharynx clear of any exudate or lesions.Normal dentition.  Neck: normal, supple, no masses, no thyromegaly Respiratory: clear to auscultation bilaterally, no wheezing, no crackles. Normal respiratory effort. No accessory muscle use.  Cardiovascular: Tachycardic Abdomen: no tenderness, no masses palpated. No hepatosplenomegaly. Bowel sounds positive.  Musculoskeletal: no clubbing / cyanosis. No joint deformity upper and lower extremities. Good ROM, no contractures. Normal muscle tone.  Skin: no rashes, lesions,  ulcers. No induration Neurologic: CN 2-12 grossly intact. Sensation intact, DTR normal. Strength 5/5 in all 4.  Psychiatric: Normal judgment and insight. Alert and oriented x 3. Normal mood.    Labs on Admission: I have personally reviewed following labs and imaging studies  CBC: Recent Labs  Lab 11/06/18 2249  WBC 10.6*  HGB 14.5  HCT 42.9  MCV 89.7  PLT 465   Basic Metabolic Panel: Recent Labs  Lab 11/06/18 2249  NA 136  K 4.1  CL 100  CO2 20*  GLUCOSE 121*  BUN 21*  CREATININE 1.51*  CALCIUM 9.7  MG 1.9   GFR: Estimated Creatinine Clearance: 78.9 mL/min (A) (by C-G formula based on SCr of 1.51 mg/dL (H)). Liver Function Tests: No results for input(s): AST, ALT, ALKPHOS, BILITOT, PROT, ALBUMIN in the last 168 hours. No results for input(s): LIPASE, AMYLASE in the last 168 hours. No results for input(s): AMMONIA in the last 168 hours. Coagulation Profile: No results for input(s): INR, PROTIME in the last 168 hours. Cardiac Enzymes: Recent Labs  Lab 11/06/18 2249  TROPONINI <0.03   BNP (last 3 results) No results for input(s): PROBNP in the last 8760 hours. HbA1C: No results for input(s): HGBA1C in the last 72 hours. CBG: No results for input(s): GLUCAP in the last 168 hours. Lipid Profile: No results for input(s): CHOL, HDL, LDLCALC, TRIG, CHOLHDL, LDLDIRECT in the last 72 hours. Thyroid Function Tests: Recent Labs    11/06/18 2313  TSH 2.400   Anemia Panel: No results for input(s): VITAMINB12, FOLATE, FERRITIN, TIBC, IRON, RETICCTPCT in the last 72 hours. Urine analysis: No results found for: COLORURINE, APPEARANCEUR, LABSPEC, PHURINE, GLUCOSEU, HGBUR, BILIRUBINUR, KETONESUR, PROTEINUR, UROBILINOGEN, NITRITE, LEUKOCYTESUR  Radiological Exams on Admission: Dg Chest 1 View  Result Date: 11/06/2018 CLINICAL DATA:  Chest pain EXAM: CHEST  1 VIEW COMPARISON:  None. FINDINGS: The heart size and mediastinal contours are within normal limits. Both lungs  are clear. The visualized skeletal structures are unremarkable. IMPRESSION: No active disease. Electronically Signed   By: Donavan Foil M.D.   On: 11/06/2018 22:56   Ct Angio Chest Pe W And/or Wo Contrast  Result Date: 11/07/2018 CLINICAL DATA:  48 year old male with chest pain shortness of breath and diaphoresis. EXAM: CT ANGIOGRAPHY CHEST WITH CONTRAST TECHNIQUE: Multidetector CT imaging of the chest was performed using the standard protocol during bolus administration of intravenous contrast. Multiplanar CT image reconstructions and MIPs were obtained to evaluate the vascular anatomy. CONTRAST:  70 milliliters OMNIPAQUE IOHEXOL 350 MG/ML SOLN COMPARISON:  Portable chest 11/06/2018. FINDINGS: Cardiovascular: Excellent contrast bolus timing in the pulmonary arterial tree. Mild streak artifact in the chest. No focal filling defect identified in the pulmonary arteries to suggest acute pulmonary embolism. Evidence of calcified coronary artery atherosclerosis on series 7, image 180. Mild cardiomegaly. No pericardial effusion. Negative visible aorta. Mediastinum/Nodes: Negative. No lymphadenopathy.  Lungs/Pleura: Major airways are patent. Minimal pulmonary atelectasis. No pleural effusion or other abnormal pulmonary opacity. Upper Abdomen: Hepatic steatosis. Negative visible gallbladder, spleen, pancreas, adrenal glands, kidneys, and bowel. Musculoskeletal: Negative. Review of the MIP images confirms the above findings. IMPRESSION: 1. Negative for acute pulmonary embolus. 2. Calcified coronary artery atherosclerosis. Mild cardiomegaly. No pericardial effusion. 3. Minimal pulmonary atelectasis. 4. Hepatic steatosis. Electronically Signed   By: Genevie Ann M.D.   On: 11/07/2018 01:05    EKG: Independently reviewed.  Assessment/Plan Principal Problem:   Chest pain, rule out acute myocardial infarction Active Problems:   Renal insufficiency   HTN (hypertension)   Sinus tachycardia    1. CP r/o - 1. Unusual  presentation with S.Tach to the 150s initially, now improved to the 110s after 2. CP improved and now only intermittent post NTG and ASA 3. CTA neg for PE or dissection or PNA or pericardial effusion or other acute abnormality 4. CP obs pathway 5. Serial trops 6. Tele monitor 7. NPO 8. Message sent to P. Trent for cards eval in AM 9. 2d Echo as next step? Will defer to cards though 10. PRN morphine ordered 2. S.Tach - 1. Unclear cause, but looks like hes had this on recent office visits, and PCP started him on Cardizem for this. 2. Will continue cardizem for now 3. If needed, then add beta blocker 4. TSH nl 5. Check procalcitonin 6. Cards eval 3. HTN - 1. Continue cardizem 2. Holding HCTZ and amlodipine 3. If BP rises, then would add beta blocker as next step to get better control of HR 4. Renal insufficiency - 1. Apparently acute.  Looks like creat was 1.02 at PCPs office on 5/13 2. 2L NS in ED 3. Repeat tomorrow AM  DVT prophylaxis: Lovenox Code Status: Full Family Communication: No family in room Disposition Plan: Home after admit Consults called: Message sent to P. Abagail Kitchens for routine IP cards eval Admission status: Place in obs    , [place] [facility]  How to contact the [facility] Attending or Consulting provider [place] or covering provider during after hours Candelaria Arenas, for this patient?  1. Check the care team in [facility] and look for a) attending/consulting [facility] provider listed and b) the [facility] team listed 2. Log into [email or web address]  Amion Physician Scheduling and messaging for groups and whole hospitals  On call and physician scheduling software for group practices, residents, hospitalists and other medical providers for call, clinic, rotation and shift schedules. OnCall [employer] is a hospital-wide system for scheduling doctors and paging doctors on call. EasyPlot is for scientific plotting and data analysis.  [email or web address]  and use [place]'s universal  password to access. If you do not have the password, please contact the hospital operator.  3. Locate the [facility] provider you are looking for under [facility] and page to a number that you can be directly reached. 4. If you still have difficulty reaching the provider, please page the [facility] (Director on Call) for the Hospitalists listed on amion for assistance.  11/07/2018, 6:17 AM

## 2018-11-07 NOTE — ED Notes (Signed)
Pt denies pain. Endorses being SOB and can not get comfortable at this time. Pt is sitting on the bed, labored breathing

## 2018-11-07 NOTE — ED Notes (Signed)
Pt O2 level noted to drop while sleeping, decreasing down to 85% at times, quickly rebounds while still sleeping, pt arouses easily, no distress noted, denies history of sleep apnea but states he is being tested

## 2018-11-07 NOTE — ED Notes (Signed)
ED TO INPATIENT HANDOFF REPORT  ED Nurse Name and Phone #: Kathlee Nations [phone]  S Name/Age/Gender Rodney Lane 48 y.o. male Room/Bed: 029C/029C  Code Status   Code Status: Full Code  Home/SNF/Other Home Patient oriented to: self, place, time and situation Is this baseline? Yes   Triage Complete: Triage complete  Chief Complaint CP  Triage Note Pt sts he's had some shob today then today at work had non radiating substernal chest pain. 324 ASA and 2 SLN given PTA with improvement. 140 ST with EMS. Just recently started on Cardizem.    Allergies No Known Allergies  Level of Care/Admitting Diagnosis ED Disposition    ED Disposition Condition [facility] Area: [place] [[id number]]  Level of Care: Progressive [102]  I expect the patient will be discharged within 24 hours: No (not a candidate for 5C-Observation unit)  Covid Evaluation: Confirmed COVID Negative  Diagnosis: Chest pain, rule out acute myocardial infarction [222979]  Admitting Physician: Etta Quill [[id number]]  Attending Physician: Etta Quill [[id number]]  PT Class (Do Not Modify): Observation [104]  PT Acc Code (Do Not Modify): Observation [10022]       B Medical/Surgery History Past Medical History:  Diagnosis Date  . HTN (hypertension)   . Tachycardia    Not sure why it goes fast, but someone put him on cardizem.  Appears to be S.tach   History reviewed. No pertinent surgical history.   A IV Location/Drains/Wounds Patient Lines/Drains/Airways Status   Active Line/Drains/Airways    Name:   Placement date:   Placement time:   Site:   Days:   Peripheral IV 11/06/18 Left Antecubital   11/06/18    2207    Antecubital   1          Intake/Output Last 24 hours  Intake/Output Summary (Last 24 hours) at 11/07/2018 0657 Last data filed at 11/07/2018 0250 Gross per 24 hour  Intake 1900 ml  Output -  Net 1900 ml    Labs/Imaging Results for orders placed or performed during  the hospital encounter of 11/06/18 (from the past 48 hour(s))  Basic metabolic panel     Status: Abnormal   Collection Time: 11/06/18 10:49 PM  Result Value Ref Range   Sodium 136 135 - 145 mmol/L   Potassium 4.1 3.5 - 5.1 mmol/L   Chloride 100 98 - 111 mmol/L   CO2 20 (L) 22 - 32 mmol/L   Glucose, Bld 121 (H) 70 - 99 mg/dL   BUN 21 (H) 6 - 20 mg/dL   Creatinine, Ser 1.51 (H) 0.61 - 1.24 mg/dL   Calcium 9.7 8.9 - 10.3 mg/dL   GFR calc non Af Amer 54 (L) >60 mL/min   GFR calc Af Amer >60 >60 mL/min   Anion gap 16 (H) 5 - 15    Comment: Performed at [facility], [address] [place], [place] [address]  Magnesium     Status: None   Collection Time: 11/06/18 10:49 PM  Result Value Ref Range   Magnesium 1.9 1.7 - 2.4 mg/dL    Comment: Performed at [facility], [place] [address], [place] [address]  CBC     Status: Abnormal   Collection Time: 11/06/18 10:49 PM  Result Value Ref Range   WBC 10.6 (H) 4.0 - 10.5 K/uL   RBC 4.78 4.22 - 5.81 MIL/uL   Hemoglobin 14.5 13.0 - 17.0 g/dL   HCT 42.9 39.0 -  52.0 %   MCV 89.7 80.0 - 100.0 fL   MCH 30.3 26.0 - 34.0 pg   MCHC 33.8 30.0 - 36.0 g/dL   RDW 13.0 11.5 - 15.5 %   Platelets 172 150 - 400 K/uL   nRBC 0.0 0.0 - 0.2 %    Comment: Performed at [facility], [place] [address], [place], [place] [address]  Troponin I - ONCE - STAT     Status: None   Collection Time: 11/06/18 10:49 PM  Result Value Ref Range   Troponin I <0.03 <0.03 ng/mL    Comment: Performed at [facility], [place] [address] [place], [place] [address]  Urine rapid drug screen (hosp performed)     Status: None   Collection Time: 11/06/18 11:13 PM  Result Value Ref Range   Opiates NONE DETECTED NONE DETECTED   Cocaine NONE DETECTED NONE DETECTED   Benzodiazepines NONE DETECTED NONE DETECTED   Amphetamines NONE DETECTED NONE DETECTED   Tetrahydrocannabinol NONE DETECTED NONE DETECTED   Barbiturates NONE DETECTED NONE DETECTED    Comment:  (NOTE) DRUG SCREEN FOR MEDICAL PURPOSES ONLY.  IF CONFIRMATION IS NEEDED FOR ANY PURPOSE, NOTIFY LAB WITHIN 5 DAYS. LOWEST DETECTABLE LIMITS FOR URINE DRUG SCREEN Drug Class                     Cutoff (ng/mL) Amphetamine and metabolites    1000 Barbiturate and metabolites    200 Benzodiazepine                 979 Tricyclics and metabolites     300 Opiates and metabolites        300 Cocaine and metabolites        300 THC                            50 Performed at [facility], [place] [address], [place], [place] [address]   TSH     Status: None   Collection Time: 11/06/18 11:13 PM  Result Value Ref Range   TSH 2.400 0.350 - 4.500 uIU/mL    Comment: Performed by a 3rd Generation assay with a functional sensitivity of <=0.01 uIU/mL. Performed at [facility], [place] [address] [place], [place] [address]   D-dimer, quantitative (not at [facility])     Status: Abnormal   Collection Time: 11/06/18 11:14 PM  Result Value Ref Range   D-Dimer, Quant 0.56 (H) 0.00 - 0.50 ug/mL-FEU    Comment: (NOTE) At the manufacturer cut-off of 0.50 ug/mL FEU, this assay has been documented to exclude PE with a sensitivity and negative predictive value of 97 to 99%.  At this time, this assay has not been approved by the FDA to exclude DVT/VTE. Results should be correlated with clinical presentation. Performed at [facility], [place] [address], [place], [place] [address]   SARS Coronavirus 2     Status: None   Collection Time: 11/06/18 11:39 PM  Result Value Ref Range   SARS Coronavirus 2 NOT DETECTED NOT DETECTED    Comment: (NOTE) SARS-CoV-2 target nucleic acids are NOT DETECTED. The SARS-CoV-2 RNA is generally detectable in upper and lower respiratory specimens during the acute phase of infection.  Negative  results do not preclude SARS-CoV-2 infection, do not rule out co-infections with other pathogens, and should not be used as the sole basis for treatment or other patient management  decisions.  Negative results must be combined with clinical observations, patient history, and epidemiological information. The expected result is Not Detected. Fact Sheet for Patients: http://www.biofiredefense.com/wp-content/uploads/2020/03/BIOFIRE-COVID -19-patients.pdf Fact Sheet for Healthcare Providers: http://www.biofiredefense.com/wp-content/uploads/2020/03/BIOFIRE-COVID -19-hcp.pdf This test is not yet approved or cleared by the [place] and  has been authorized for detection and/or diagnosis of SARS-CoV-2 by FDA under an Emergency Use Authorization (EUA).  This EUA will remain in effec t (meaning this test can be used) for the duration of  the COVID-19 declaration under Section 564(b)(1) of the Act, 21 U.S.C. section 360bbb-3(b)(1), unless the authorization is terminated or revoked sooner. Performed at [facility], [place] [address], [place], [place] [address]   Troponin I - Now Then Q6H     Status: None   Collection Time: 11/07/18  5:23 AM  Result Value Ref Range   Troponin I <0.03 <0.03 ng/mL    Comment: Performed at [place] [address] [place], [place] [address]   Dg Chest 1 View  Result Date: 11/06/2018 CLINICAL DATA:  Chest pain EXAM: CHEST  1 VIEW COMPARISON:  None. FINDINGS: The heart size and mediastinal contours are within normal limits. Both lungs are clear. The visualized skeletal structures are unremarkable. IMPRESSION: No active disease. Electronically Signed   By: Donavan Foil M.D.   On: 11/06/2018 22:56   Ct Angio Chest Pe W And/or Wo Contrast  Result Date: 11/07/2018 CLINICAL DATA:  49 year old male with chest pain shortness of breath and diaphoresis. EXAM: CT ANGIOGRAPHY CHEST WITH CONTRAST TECHNIQUE: Multidetector CT imaging of the chest was performed using the standard protocol during bolus administration of intravenous contrast. Multiplanar CT image reconstructions and MIPs were obtained to evaluate the vascular anatomy.  CONTRAST:  70 milliliters OMNIPAQUE IOHEXOL 350 MG/ML SOLN COMPARISON:  Portable chest 11/06/2018. FINDINGS: Cardiovascular: Excellent contrast bolus timing in the pulmonary arterial tree. Mild streak artifact in the chest. No focal filling defect identified in the pulmonary arteries to suggest acute pulmonary embolism. Evidence of calcified coronary artery atherosclerosis on series 7, image 180. Mild cardiomegaly. No pericardial effusion. Negative visible aorta. Mediastinum/Nodes: Negative. No lymphadenopathy. Lungs/Pleura: Major airways are patent. Minimal pulmonary atelectasis. No pleural effusion or other abnormal pulmonary opacity. Upper Abdomen: Hepatic steatosis. Negative visible gallbladder, spleen, pancreas, adrenal glands, kidneys, and bowel. Musculoskeletal: Negative. Review of the MIP images confirms the above findings. IMPRESSION: 1. Negative for acute pulmonary embolus. 2. Calcified coronary artery atherosclerosis. Mild cardiomegaly. No pericardial effusion. 3. Minimal pulmonary atelectasis. 4. Hepatic steatosis. Electronically Signed   By: Genevie Ann M.D.   On: 11/07/2018 01:05    Pending Labs Unresulted Labs (From admission, onward)    Start     Ordered   11/08/18 8841  Basic metabolic panel  Tomorrow morning,   R     11/07/18 0627   11/07/18 0636  Procalcitonin - Baseline  ONCE - STAT,   STAT     11/07/18 0635   11/07/18 0608  Creatinine, serum  (heparin)  Once,   STAT    Comments: Baseline for heparin therapy IF NOT ALREADY DRAWN.    11/07/18 0607   11/07/18 0600  HIV antibody (Routine Testing)  Once,   STAT     11/07/18 0607   11/07/18 0446  Troponin I - Now Then Q6H  Now then every 6 hours,   R (with STAT occurrences)     11/07/18 0445   11/06/18 2313  Urine culture  ONCE - STAT,   STAT  11/06/18 2313          Vitals/Pain Today's Vitals   11/07/18 0316 11/07/18 0347 11/07/18 0455 11/07/18 0500  BP: 116/90 126/82 135/87 119/81  Pulse: (!) 120 (!) 121 (!) 116 (!) 117   Resp: (!) 24 (!) 27 (!) 24 (!) 23  Temp: 98.6 F (37 C)     TempSrc: Oral     SpO2: 98% 94% 97% 96%  Weight:      Height:      PainSc:  0-No pain 4      Isolation Precautions No active isolations  Medications Medications  diltiazem (CARDIZEM LA) 24 hr tablet 120 mg (has no administration in time range)  acetaminophen (TYLENOL) tablet 650 mg (has no administration in time range)  ondansetron (ZOFRAN) injection 4 mg (has no administration in time range)  heparin injection 5,000 Units (has no administration in time range)  morphine 2 MG/ML injection 2 mg (has no administration in time range)  sodium chloride 0.9 % bolus 500 mL (0 mLs Intravenous Stopped 11/06/18 2355)  adenosine (ADENOCARD) 6 MG/2ML injection 6 mg (6 mg Intravenous Given 11/06/18 2254)  aspirin chewable tablet 324 mg (324 mg Oral Given 11/07/18 0006)  sodium chloride 0.9 % bolus 500 mL (0 mLs Intravenous Stopped 11/07/18 0250)  sodium chloride 0.9 % bolus 1,000 mL (1,000 mLs Intravenous New Bag/Given 11/06/18 2300)  iohexol (OMNIPAQUE) 350 MG/ML injection 100 mL (100 mLs Intravenous Contrast Given 11/07/18 0049)    Mobility walks Low fall risk   Focused Assessments Cardiac Assessment Handoff:  Cardiac Rhythm: Sinus tachycardia Lab Results  Component Value Date   TROPONINI <0.03 11/07/2018   Lab Results  Component Value Date   DDIMER 0.56 (H) 11/06/2018   Does the Patient currently have chest pain?     R Recommendations: See Admitting Provider Note  Report given to:   Additional Notes:

## 2018-11-07 NOTE — ED Notes (Signed)
ORDERED DIET TRAY FOR PT  

## 2018-11-07 NOTE — ED Provider Notes (Signed)
Patient taken in sign out at shift change.  Patient tachycardia and CP.  No fever or infectious symptoms.  Refused rectal temp to assess for occult fever.  Drives forklifts all day.  Prior team concerned about PE.  D-Dimer is slightly elevated.  Will check PE study.  CT PE negative.  HR trending down some with fluids, but still seems SOB.  No wheezing or cough.  No hypoxia.    Still tachy.  Sinus tach per cardiology.  Will admit for CP rule out.  5:01 AM Appreciate Dr. Alcario Drought for bringing patient into the hospital.   Montine Circle, PA-C 11/07/18 0502    Ward, Delice Bison, DO 11/07/18 0505

## 2018-11-07 NOTE — ED Notes (Signed)
Ordered breakfast tray  

## 2018-11-08 ENCOUNTER — Inpatient Hospital Stay (HOSPITAL_COMMUNITY): Payer: BC Managed Care – PPO

## 2018-11-08 DIAGNOSIS — H538 Other visual disturbances: Secondary | ICD-10-CM

## 2018-11-08 LAB — BASIC METABOLIC PANEL
Anion gap: 11 (ref 5–15)
BUN: 15 mg/dL (ref 6–20)
CO2: 28 mmol/L (ref 22–32)
Calcium: 9.1 mg/dL (ref 8.9–10.3)
Chloride: 99 mmol/L (ref 98–111)
Creatinine, Ser: 1.28 mg/dL — ABNORMAL HIGH (ref 0.61–1.24)
GFR calc Af Amer: >60 mL/min (ref >60)
GFR calc non Af Amer: >60 mL/min (ref >60)
Glucose, Bld: 126 mg/dL — ABNORMAL HIGH (ref 70–99)
Potassium: 3.7 mmol/L (ref 3.5–5.1)
Sodium: 138 mmol/L (ref 135–145)

## 2018-11-08 LAB — C-REACTIVE PROTEIN: CRP: 6.9 mg/dL — ABNORMAL HIGH (ref <1.0)

## 2018-11-08 LAB — LIPID PANEL
Cholesterol: 200 mg/dL (ref 0–200)
HDL: 29 mg/dL — ABNORMAL LOW (ref >40)
LDL Cholesterol: UNDETERMINED mg/dL (ref 0–99)
Total CHOL/HDL Ratio: 6.9 RATIO
Triglycerides: 519 mg/dL — ABNORMAL HIGH (ref <150)
VLDL: UNDETERMINED mg/dL (ref 0–40)

## 2018-11-08 LAB — URINE CULTURE: Culture: NO GROWTH

## 2018-11-08 LAB — SEDIMENTATION RATE: Sed Rate: 27 mm/hr — ABNORMAL HIGH (ref 0–16)

## 2018-11-08 LAB — HEMOGLOBIN A1C
Hgb A1c MFr Bld: 6.8 % — ABNORMAL HIGH (ref 4.8–5.6)
Mean Plasma Glucose: 148.46 mg/dL

## 2018-11-08 LAB — GLUCOSE, CAPILLARY: Glucose-Capillary: 175 mg/dL — ABNORMAL HIGH (ref 70–99)

## 2018-11-08 LAB — LDL CHOLESTEROL, DIRECT: Direct LDL: 78.4 mg/dL (ref 0–99)

## 2018-11-08 IMAGING — MR MRI HEAD WITHOUT CONTRAST
10 of 12 series · 32 of 48 positions shown · non-contrast
Comparison: None.

CLINICAL DATA: Blurry vision

EXAM:
MRI HEAD WITHOUT CONTRAST
MRA HEAD WITHOUT CONTRAST
TECHNIQUE: Multiplanar, multiecho pulse sequences of the brain and surrounding
structures were obtained without intravenous contrast. Angiographic
images of the head were obtained using MRA technique without
contrast.

[Series 5: DWI · axial · 3.0mm · 0.88mm/px · z∈[-75,+81]mm · 6 of 106 slices shown (1 of 4)]
[im 1/106]
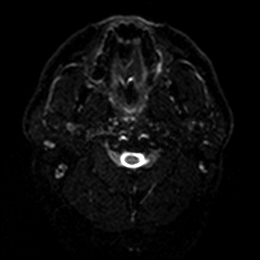
[im 22/106]
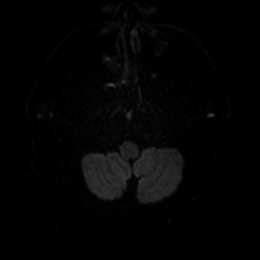
[im 43/106]
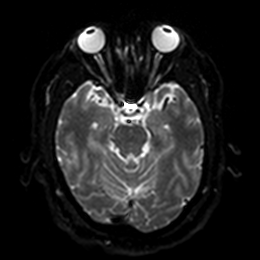
[im 64/106]
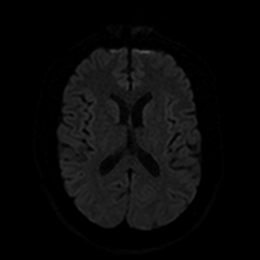
[im 85/106]
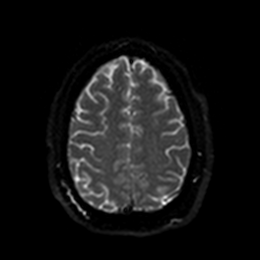
[im 106/106]
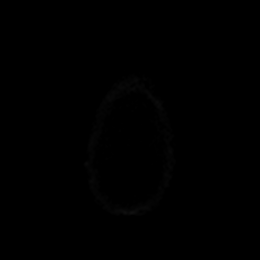

[Series 6: DWI · axial · 3.0mm · 0.88mm/px · z∈[-75,+81]mm · 3 of 53 slices shown (2 of 4)]
[im 1/53]
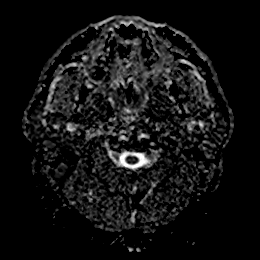
[im 27/53]
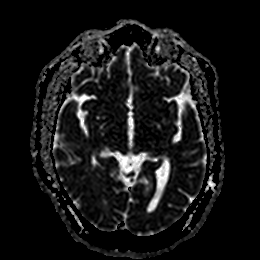
[im 53/53]
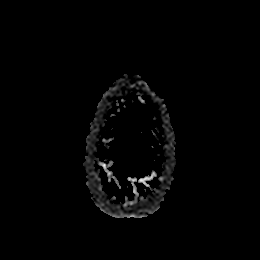

[Series 11: FLAIR · axial · 5.0mm · 0.45mm/px · z∈[-63,+81]mm · 2 of 25 slices shown]
[im 1/25]
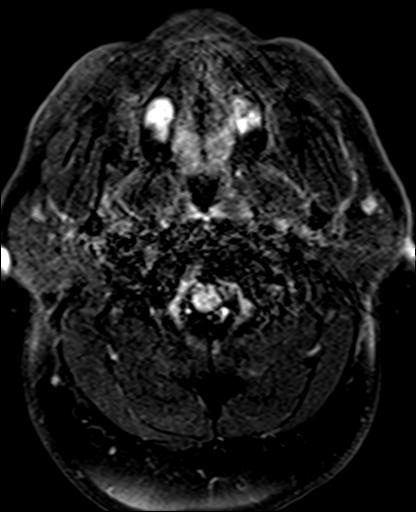
[im 25/25]
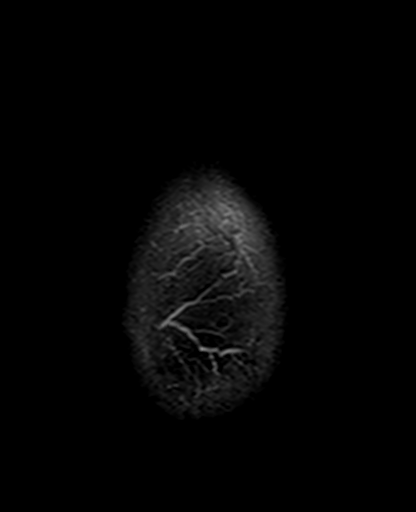

[Series 13: pha_images · axial · 3.0mm · 0.90mm/px · z∈[-71,+81]mm · 4 of 52 slices shown]
[im 1/52]
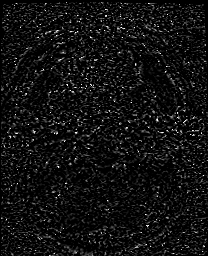
[im 18/52]
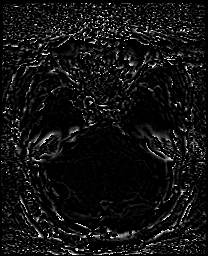
[im 35/52]
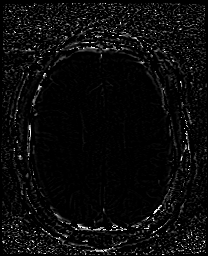
[im 52/52]
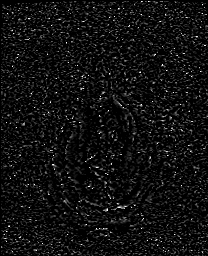

[Series 14: swi_images · axial · 3.0mm · 0.90mm/px · z∈[-71,+81]mm · 4 of 52 slices shown]
[im 1/52]
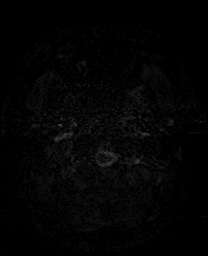
[im 18/52]
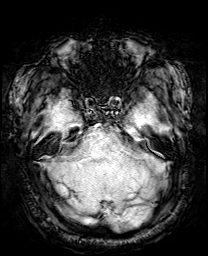
[im 35/52]
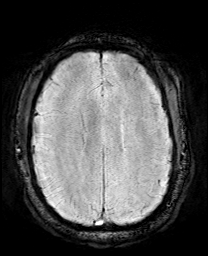
[im 52/52]
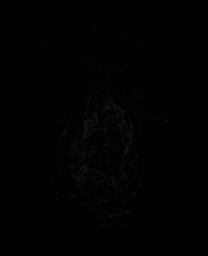

[Series 16: T2 · axial · 5.0mm · 0.72mm/px · z∈[-67,+82]mm · 2 of 26 slices shown (1 of 2)]
[im 1/26]
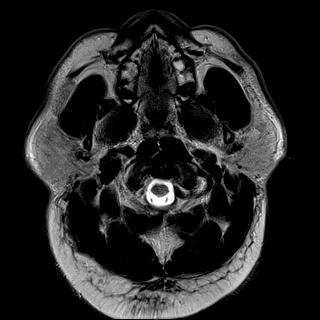
[im 26/26]
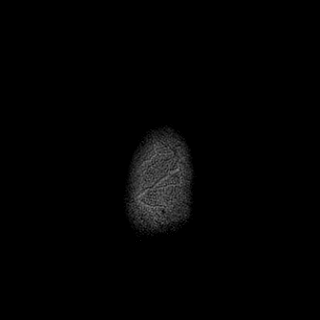

[Series 17: DWI · coronal · 4.0mm · 0.88mm/px · 5 of 68 slices shown (3 of 4)]
[im 1/68]
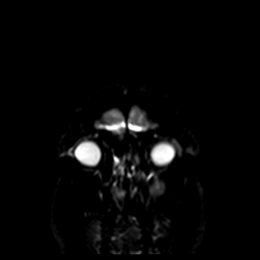
[im 17/68]
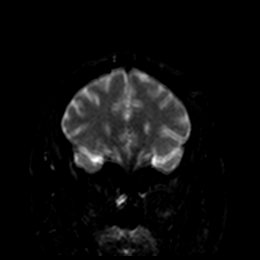
[im 34/68]
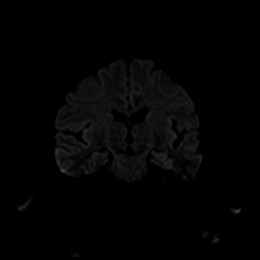
[im 51/68]
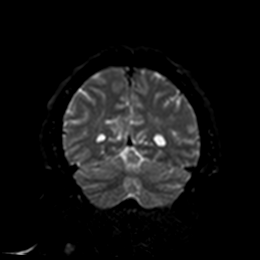
[im 68/68]
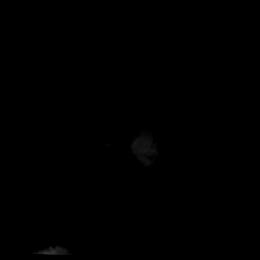

[Series 18: DWI · coronal · 4.0mm · 0.88mm/px · 2 of 34 slices shown (4 of 4)]
[im 1/34]
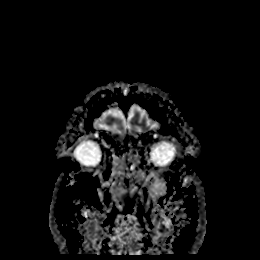
[im 34/34]
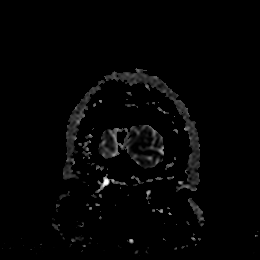

[Series 19: T1 · sagittal · 5.0mm · 0.78mm/px · 2 of 23 slices shown]
[im 1/23]
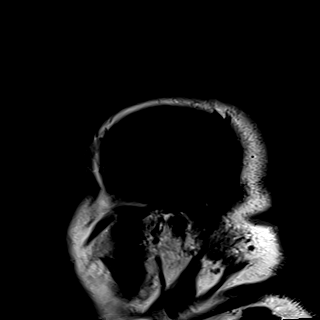
[im 23/23]
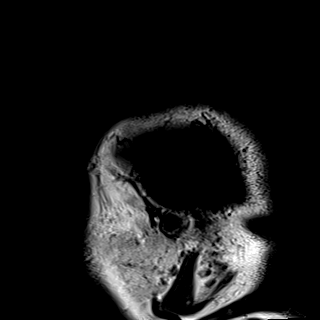

[Series 21: T2 · coronal · 5.0mm · 0.34mm/px · 2 of 29 slices shown (2 of 2)]
[im 1/29]
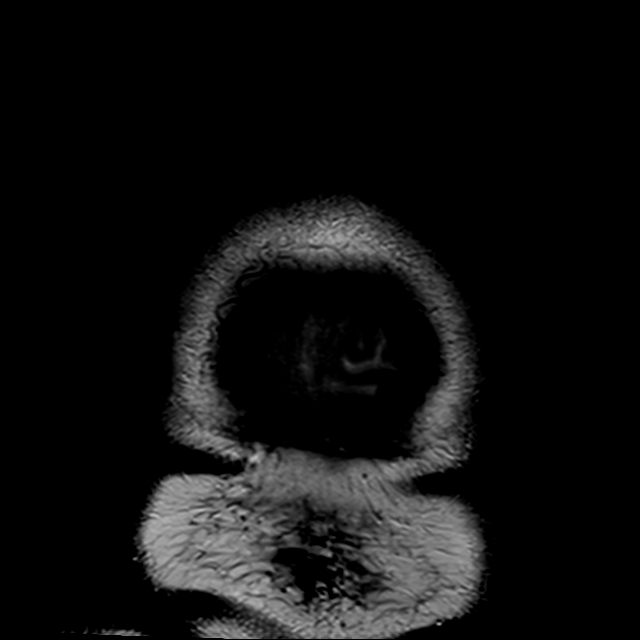
[im 29/29]
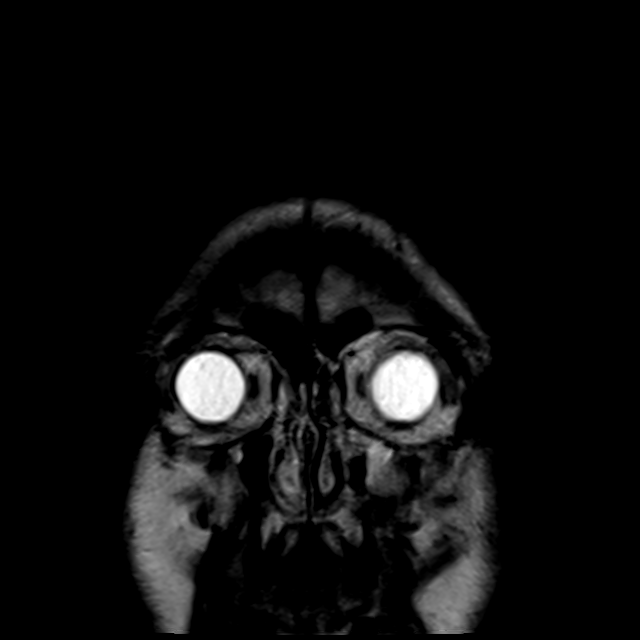

[32 of 48 positions shown; findings below may reference images not displayed]

FINDINGS: MRI HEAD FINDINGS

BRAIN: There is no acute infarct, acute hemorrhage or extra-axial
collection. The midline structures are normal. No midline shift or
other mass effect. The white matter signal is normal for the
patient's age. The cerebral and cerebellar volume are
age-appropriate. No hydrocephalus. Susceptibility-sensitive
sequences show no chronic microhemorrhage or superficial siderosis.
No mass lesion.

VASCULAR: The major intracranial arterial and venous sinus flow
voids are normal.

SKULL AND UPPER CERVICAL SPINE: Calvarial bone marrow signal is
normal. There is no skull base mass. Visualized upper cervical spine
and soft tissues are normal.

SINUSES/ORBITS: Polypoid mucosal thickening of both maxillary
sinuses. No mastoid or middle ear effusion. The orbits are normal.

MRA HEAD FINDINGS

POSTERIOR CIRCULATION:

--Vertebral arteries: Normal V4 segments.

--Posterior inferior cerebellar arteries (PICA): Patent origins from
the vertebral arteries.

--Anterior inferior cerebellar arteries (AICA): Patent origins from
the basilar artery.

--Basilar artery: Normal.

--Superior cerebellar arteries: Normal.

--Posterior cerebral arteries (PCA): Normal. Both originate from the
basilar artery. Posterior communicating arteries (p-comm) are
diminutive or absent.

ANTERIOR CIRCULATION:

--Intracranial internal carotid arteries: Normal.

--Anterior cerebral arteries (ACA): Normal. Both A1 segments are
present. Patent anterior communicating artery (a-comm).

--Middle cerebral arteries (MCA): Normal.
IMPRESSION: Normal MRI/MRA of the brain.

## 2018-11-08 MED ORDER — DILTIAZEM HCL ER COATED BEADS 240 MG PO CP24
240.0000 mg | ORAL_CAPSULE | Freq: Every day | ORAL | Status: DC
  Administered 2018-11-08 – 2018-11-10 (×3): 240 mg via ORAL
  Filled 2018-11-08 (×3): qty 1

## 2018-11-08 MED ORDER — SODIUM CHLORIDE 0.9 % IV SOLN
INTRAVENOUS | Status: DC
  Administered 2018-11-08 – 2018-11-10 (×4): via INTRAVENOUS

## 2018-11-08 MED ORDER — MAGIC MOUTHWASH
5.0000 mL | Freq: Three times a day (TID) | ORAL | Status: DC
  Administered 2018-11-08 – 2018-11-10 (×5): 5 mL via ORAL
  Filled 2018-11-08 (×6): qty 5

## 2018-11-08 NOTE — Code Documentation (Addendum)
Code Stroke called at 1819 - I received the page at [facility] [place] nurse called me at 1821 to inform me as well. Symptoms were blurry vision and difficulty swallowing. I arrived at 1822, patient was alert and oriented x 4, NIH was 0, no longer having blurry vision, per patient difficutly swallowing started last night.   Neuro MD came to bedside, assessed patient as well. Code Stroke cancelled, MRI/MRA ordered. VS and blood sugar were WNL.   Start Time 1823  End Time 74   TRH MD (DAYS) and nursing staff were updated, if patient has new neuro changes, pls call [facility] and [facility] provider on call for assistance.

## 2018-11-08 NOTE — Progress Notes (Signed)
Per conversation with Lamount Cranker, MD, ok to give cardizem if SBP >120. BP 126/85. Will continue to monitor

## 2018-11-08 NOTE — Consult Note (Signed)
Neurology Consultation Reason for Consult: Blurred vision Referring Physician: Guilford Shi  CC: Blurred vision  History is obtained from: Patient  HPI: Rodney Lane is a 48 y.o. male with a history of hypertension who is been admitted for evaluation of tachycardia of unclear etiology along with mild AKI.  He has had 2 episodes of blurred vision since admission, 1 yesterday and then 1 today at 1 PM lasting approximately 30 seconds.  He describes it as bilateral, meaning that if he closes either eye it is still blurred in the eye that is remaining open.  He also complained of some difficulty swallowing that is been persistent since yesterday, I do this a code stroke was called tonight.   LKW: 1 PM tpa given?: no, resolved symptoms    ROS: A 14 point ROS was performed and is negative except as noted in the HPI.  Past Medical History:  Diagnosis Date  . HTN (hypertension)   . Tachycardia    Not sure why it goes fast, but someone put him on cardizem.  Appears to be S.tach     Family History  Problem Relation Age of Onset  . Cancer Mother   . Cancer Father   . Diabetes Father   . Heart disease Maternal Grandfather   . Heart disease Paternal Grandfather      Social History:  reports that he has never smoked. He does not have any smokeless tobacco history on file. He reports that he does not drink alcohol or use drugs.   Exam: Current vital signs: BP 138/84 (BP Location: Left Arm)   Pulse (!) 107   Temp 99.4 F (37.4 C) (Oral)   Resp (!) 24   Ht 5\' 9"  (1.753 m)   Wt 135.4 kg   SpO2 100%   BMI 44.08 kg/m  Vital signs in last 24 hours: Temp:  [98.5 F (36.9 C)-99.9 F (37.7 C)] 99.4 F (37.4 C) (06/12 1834) Pulse Rate:  [105-124] 107 (06/12 1419) Resp:  [24] 24 (06/12 0449) BP: (118-139)/(77-91) 138/84 (06/12 1834) SpO2:  [89 %-100 %] 100 % (06/12 1419) Weight:  [135.4 kg] 135.4 kg (06/12 0449)   Physical Exam  Constitutional: Appears well-developed and  well-nourished.  Psych: Affect appropriate to situation Eyes: No scleral injection HENT: No OP obstrucion Head: Normocephalic.  Cardiovascular: Normal rate and regular rhythm.  Respiratory: Effort normal, non-labored breathing GI: Soft.  No distension. There is no tenderness.  Skin: WDI  Neuro: Mental Status: Patient is awake, alert, oriented to person, place, month, year, and situation. Patient is able to give a clear and coherent history. No signs of aphasia or neglect Cranial Nerves: II: Visual Fields are full. Pupils are equal, round, and reactive to light.   III,IV, VI: EOMI without ptosis or diploplia.  V: Facial sensation is symmetric to temperature VII: Facial movement is symmetric.  VIII: hearing is intact to voice X: Uvula elevates symmetrically XI: Shoulder shrug is symmetric. XII: tongue is midline without atrophy or fasciculations.  Motor: Tone is normal. Bulk is normal. 5/5 strength was present in all four extremities.  Sensory: Sensation is symmetric to light touch and temperature in the arms and legs. Cerebellar: FNF and HKS are intact bilaterally   I have reviewed labs in epic and the results pertinent to this consultation are: Borderline creatinine  Impression: 48 year old male with 2 episodes of transient blurred vision of unclear etiology.  Possibilities are wide-ranging including dry eyes, brief hypotension due to a tachycardia/dehydration, or less likely stroke/TIA.  I do think an MRI/MRA of the head will be reasonable, however if this is negative then I do not think I would do any further neurological work-up.  Recommendations: 1) MRI brain/MRA head 2) if negative, no further work-up needed.   Roland Rack, MD [facility] [phone]  If 7pm- 7am, please page neurology on call as listed in [place].

## 2018-11-08 NOTE — Progress Notes (Deleted)
PROGRESS NOTE    Eshaan Titzer  AOZ:[id number]  DOB: [birth year]  DOA: 11/06/2018 PCP: System, Pcp Not In  Brief Narrative:  48 y.o.malewith medical history significant ofHTN, obesity, tachycardia being treated with cardizem by PCP.Patient presents to the ED with c/o SOB and CP. HR in 150s, received adenosine with no change in rhythm and implied sinus tach.  His heart rate did improve and was in the 110s at the time of admission to the medical floor. Patient reports several weeks of increasing exertional dyspnea relieved by rest. Unable to walk from mailbox to home without taking a break.  He also reports lightheadedness while walking.  States had a negative stress test many years back.EKG NSR and trop x2 -ve. Lab work revealed creatinine of 1.5 with no prior baseline to compare.  Patient had persistent chest tightness at the time of admission and Nitropatch added/cardiology consulted.  Cards consult pending. Echo ordered. TSH WNL.  Subjective:   He states chest discomfort has improved but still feels dyspneic on and off.  He is saturating well on room air.  He is also complaining of sore throat with pain on swallowing.  He had a low-grade fever last night and early this morning with T-max of 100.8.  Objective: Vitals:   11/09/18 0410 11/09/18 0942 11/09/18 1329 11/09/18 1456  BP: 128/81 125/79 134/87 124/82  Pulse: (!) 109  (!) 109 (!) 102  Resp: (!) 24     Temp: 99.1 F (37.3 C)   98 F (36.7 C)  TempSrc: Oral   Oral  SpO2: 96%  96% 95%  Weight: (!) 136.4 kg     Height:        Intake/Output Summary (Last 24 hours) at 11/09/2018 1623 Last data filed at 11/09/2018 1332 Gross per 24 hour  Intake 1480 ml  Output 300 ml  Net 1180 ml   Filed Weights   11/06/18 2216 11/08/18 0449 11/09/18 0410  Weight: 127 kg 135.4 kg (!) 136.4 kg    Physical Examination:  General exam: Appears calm and comfortable  ENT: Pharyngeal erythema with?  Pustule in the left peritonsillar  area Respiratory system: Clear to auscultation. Respiratory effort normal. Cardiovascular system: S1 & S2 heard, RRR. No JVD, murmurs, rubs, gallops or clicks. No pedal edema. Gastrointestinal system: Abdomen is obese, nontender. No organomegaly or masses felt. Normal bowel sounds heard. Central nervous system: Alert and oriented. No focal neurological deficits. Extremities: Symmetric 5 x 5 power. Skin: No rashes, lesions or ulcers Psychiatry: Judgement and insight appear normal. Mood & affect anxious.     Data Reviewed: I have personally reviewed following labs and imaging studies  CBC: Recent Labs  Lab 11/06/18 2249 11/09/18 1152  WBC 10.6* 6.8  HGB 14.5 12.6*  HCT 42.9 38.1*  MCV 89.7 91.1  PLT 172 962   Basic Metabolic Panel: Recent Labs  Lab 11/06/18 2249 11/07/18 0622 11/08/18 0527 11/09/18 1117  NA 136  --  138 136  K 4.1  --  3.7 3.6  CL 100  --  99 102  CO2 20*  --  28 25  GLUCOSE 121*  --  126* 109*  BUN 21*  --  15 12  CREATININE 1.51* 1.50* 1.28* 1.26*  CALCIUM 9.7  --  9.1 8.7*  MG 1.9  --   --   --    GFR: Estimated Creatinine Clearance: 98.4 mL/min (A) (by C-G formula based on SCr of 1.26 mg/dL (H)). Liver Function Tests: No results for input(s): AST,  ALT, ALKPHOS, BILITOT, PROT, ALBUMIN in the last 168 hours. No results for input(s): LIPASE, AMYLASE in the last 168 hours. No results for input(s): AMMONIA in the last 168 hours. Coagulation Profile: No results for input(s): INR, PROTIME in the last 168 hours. Cardiac Enzymes: Recent Labs  Lab 11/06/18 2249 11/07/18 0523 11/07/18 1044 11/07/18 1609  TROPONINI <0.03 <0.03 <0.03 <0.03   BNP (last 3 results) No results for input(s): PROBNP in the last 8760 hours. HbA1C: Recent Labs    11/08/18 0527  HGBA1C 6.8*   CBG: Recent Labs  Lab 11/08/18 1828  GLUCAP 175*   Lipid Profile: Recent Labs    11/08/18 0527  CHOL 200  HDL 29*  LDLCALC UNABLE TO CALCULATE IF TRIGLYCERIDE OVER 400  mg/dL  TRIG 519*  CHOLHDL 6.9  LDLDIRECT 78.4   Thyroid Function Tests: Recent Labs    11/06/18 2313  TSH 2.400   Anemia Panel: No results for input(s): VITAMINB12, FOLATE, FERRITIN, TIBC, IRON, RETICCTPCT in the last 72 hours. Sepsis Labs: Recent Labs  Lab 11/07/18 0641  PROCALCITON 0.16    Recent Results (from the past 240 hour(s))  SARS Coronavirus 2     Status: None   Collection Time: 11/06/18 11:39 PM  Result Value Ref Range Status   SARS Coronavirus 2 NOT DETECTED NOT DETECTED Final    Comment: (NOTE) SARS-CoV-2 target nucleic acids are NOT DETECTED. The SARS-CoV-2 RNA is generally detectable in upper and lower respiratory specimens during the acute phase of infection.  Negative  results do not preclude SARS-CoV-2 infection, do not rule out co-infections with other pathogens, and should not be used as the sole basis for treatment or other patient management decisions.  Negative results must be combined with clinical observations, patient history, and epidemiological information. The expected result is Not Detected. Fact Sheet for Patients: http://www.biofiredefense.com/wp-content/uploads/2020/03/BIOFIRE-COVID -19-patients.pdf Fact Sheet for Healthcare Providers: http://www.biofiredefense.com/wp-content/uploads/2020/03/BIOFIRE-COVID -19-hcp.pdf This test is not yet approved or cleared by the [place] and  has been authorized for detection and/or diagnosis of SARS-CoV-2 by FDA under an Emergency Use Authorization (EUA).  This EUA will remain in effec t (meaning this test can be used) for the duration of  the COVID-19 declaration under Section 564(b)(1) of the Act, 21 U.S.C. section 360bbb-3(b)(1), unless the authorization is terminated or revoked sooner. Performed at [facility], [place] [address] [place], [place] [address]   Urine culture     Status: None   Collection Time: 11/07/18 12:25 AM   Specimen: Urine, Random  Result Value Ref Range  Status   Specimen Description URINE, RANDOM  Final   Special Requests NONE  Final   Culture   Final    NO GROWTH Performed at [facility], [place] [address] [place], [place] [address]    Report Status 11/08/2018 FINAL  Final  Culture, group A strep     Status: None (Preliminary result)   Collection Time: 11/08/18 12:03 PM   Specimen: Throat  Result Value Ref Range Status   Specimen Description THROAT  Final   Special Requests NONE  Final   Culture   Final    TOO YOUNG TO READ Performed at [facility], [address], [place], [place] [address]    Report Status PENDING  Incomplete      Radiology Studies: Mr Virgel Paling YW Contrast  Result Date: 11/08/2018 CLINICAL DATA:  Blurry vision EXAM: MRI HEAD WITHOUT CONTRAST MRA HEAD WITHOUT CONTRAST TECHNIQUE: Multiplanar, multiecho pulse sequences of  the brain and surrounding structures were obtained without intravenous contrast. Angiographic images of the head were obtained using MRA technique without contrast. COMPARISON:  None. FINDINGS: MRI HEAD FINDINGS BRAIN: There is no acute infarct, acute hemorrhage or extra-axial collection. The midline structures are normal. No midline shift or other mass effect. The white matter signal is normal for the patient's age. The cerebral and cerebellar volume are age-appropriate. No hydrocephalus. Susceptibility-sensitive sequences show no chronic microhemorrhage or superficial siderosis. No mass lesion. VASCULAR: The major intracranial arterial and venous sinus flow voids are normal. SKULL AND UPPER CERVICAL SPINE: Calvarial bone marrow signal is normal. There is no skull base mass. Visualized upper cervical spine and soft tissues are normal. SINUSES/ORBITS: Polypoid mucosal thickening of both maxillary sinuses. No mastoid or middle ear effusion. The orbits are normal. MRA HEAD FINDINGS POSTERIOR CIRCULATION: --Vertebral arteries: Normal V4 segments. --Posterior inferior cerebellar arteries (PICA):  Patent origins from the vertebral arteries. --Anterior inferior cerebellar arteries (AICA): Patent origins from the basilar artery. --Basilar artery: Normal. --Superior cerebellar arteries: Normal. --Posterior cerebral arteries (PCA): Normal. Both originate from the basilar artery. Posterior communicating arteries (p-comm) are diminutive or absent. ANTERIOR CIRCULATION: --Intracranial internal carotid arteries: Normal. --Anterior cerebral arteries (ACA): Normal. Both A1 segments are present. Patent anterior communicating artery (a-comm). --Middle cerebral arteries (MCA): Normal. IMPRESSION: Normal MRI/MRA of the brain. Electronically Signed   By: Ulyses Jarred M.D.   On: 11/08/2018 20:47   Mr Brain Wo Contrast  Result Date: 11/08/2018 CLINICAL DATA:  Blurry vision EXAM: MRI HEAD WITHOUT CONTRAST MRA HEAD WITHOUT CONTRAST TECHNIQUE: Multiplanar, multiecho pulse sequences of the brain and surrounding structures were obtained without intravenous contrast. Angiographic images of the head were obtained using MRA technique without contrast. COMPARISON:  None. FINDINGS: MRI HEAD FINDINGS BRAIN: There is no acute infarct, acute hemorrhage or extra-axial collection. The midline structures are normal. No midline shift or other mass effect. The white matter signal is normal for the patient's age. The cerebral and cerebellar volume are age-appropriate. No hydrocephalus. Susceptibility-sensitive sequences show no chronic microhemorrhage or superficial siderosis. No mass lesion. VASCULAR: The major intracranial arterial and venous sinus flow voids are normal. SKULL AND UPPER CERVICAL SPINE: Calvarial bone marrow signal is normal. There is no skull base mass. Visualized upper cervical spine and soft tissues are normal. SINUSES/ORBITS: Polypoid mucosal thickening of both maxillary sinuses. No mastoid or middle ear effusion. The orbits are normal. MRA HEAD FINDINGS POSTERIOR CIRCULATION: --Vertebral arteries: Normal V4 segments.  --Posterior inferior cerebellar arteries (PICA): Patent origins from the vertebral arteries. --Anterior inferior cerebellar arteries (AICA): Patent origins from the basilar artery. --Basilar artery: Normal. --Superior cerebellar arteries: Normal. --Posterior cerebral arteries (PCA): Normal. Both originate from the basilar artery. Posterior communicating arteries (p-comm) are diminutive or absent. ANTERIOR CIRCULATION: --Intracranial internal carotid arteries: Normal. --Anterior cerebral arteries (ACA): Normal. Both A1 segments are present. Patent anterior communicating artery (a-comm). --Middle cerebral arteries (MCA): Normal. IMPRESSION: Normal MRI/MRA of the brain. Electronically Signed   By: Ulyses Jarred M.D.   On: 11/08/2018 20:47    Scheduled Meds:  cefdinir  300 mg Oral Q12H   diltiazem  240 mg Oral Daily   heparin  5,000 Units Subcutaneous Q8H   magic mouthwash  5 mL Oral TID   Continuous Infusions:  sodium chloride 100 mL/hr at 11/09/18 1332    Assessment & Plan:   1.  Chest pain/dyspnea: Now resolved. No significant EKG changes.  Troponin x3-.  Off Nitropatch given complaints of headache/blurred vision.  Echo  shows EF of 50 to 55%, mildly impaired LV relaxation. Patient refused stress test.  Seen by cardiology during the hospital course.  CT chest negative for PE and no evidence of pneumonia. COVID testing negative  2. Sinus tachycardia: Likely secondary to dehydration/anxiety/now fever.  Patient seen by cardiology who initially felt he may have atrial arrhythmia. Dr. Johnsie Cancel attempted vagal maneuvers and CSM without changes to rate/rhythm.  He was subsequently evaluated by electrophysiology who felt patient likely has sinus tachycardia as a physiological response to dehydration. TSH within normal limits.  Cardiology recommended higher dose diltiazem upon discharge.    3.  Acute kidney injury: In the setting of dehydration/diuretics.  Patient's creatinine on presentation was 1.5  which is improved to 1.26 today with IV fluids.  He was on hydrochlorothiazide as outpatient which has been held.  This could be his new normal creatinine.  Will reduce IV fluid rate and hydrate for another day.  4.?  Orthostatic hypotension: Off Norvasc.  DC Nitropatch.  Patient did have some orthostatic change on blood pressure checks in the ED.  Repeat checks showed systolic 829H with no orthostatic change.  5.  Sore throat/low-grade fever: Cultures for strep throat sent and started on empiric antibiotics.Will order Magic mouthwash.  Mild elevation in inflammatory markers might be secondary to this.  Repeat labs showed resolution of mild leukocytosis.  6.  Blurred vision: Now resolved.  Code stroke summoned yesterday due to his complaints of intermittent blurred vision.  CT head/MRI MRA of the head were negative. Appreciate neurology evaluation we have cleared him from their standpoint.  7.  Anxiety: Not sure if patient's complaints of #1 and #2 related to anxiety.  Patient has been noted to be anxious since presentation with subjective complaints much dramatic than objective findings.Will try hydroxyzine .   DVT prophylaxis: Lovenox Code Status: Full code Family / Patient Communication: Discussed with patient  Disposition Plan: Home in a.m. after another day of hydration/antibiotics if remains afebrile with stable labs.    LOS: 2 days    Time spent: 25 minutes  Guilford Shi, MD [facility] Pager [phone]  If 7PM-7AM, please contact night-coverage [email or web address] Password [facility] 11/09/2018, 4:23 PM

## 2018-11-08 NOTE — Progress Notes (Signed)
Subjective:  Has headache and brief periods of blurred vision  No cardiac complaints   Objective:  Vitals:   11/07/18 1800 11/07/18 2010 11/08/18 0010 11/08/18 0449  BP:  130/84 139/77 123/88  Pulse: (!) 106 (!) 117 (!) 107 (!) 124  Resp:    (!) 24  Temp:  99.9 F (37.7 C) 98.5 F (36.9 C) 98.9 F (37.2 C)  TempSrc:  Oral Oral Oral  SpO2: 98% 97% 97% 99%  Weight:    135.4 kg  Height:        Intake/Output from previous day: No intake or output data in the 24 hours ending 11/08/18 0831  Physical Exam:  Affect appropriate Obese black male  HEENT: normal Neck supple with no adenopathy JVP normal no bruits no thyromegaly Lungs clear with no wheezing and good diaphragmatic motion Heart:  S1/S2 no murmur, no rub, gallop or click PMI normal Abdomen: benighn, BS positve, no tenderness, no AAA no bruit.  No HSM or HJR Distal pulses intact with no bruits No edema Neuro non-focal Skin warm and dry No muscular weakness   Lab Results: Basic Metabolic Panel: Recent Labs    11/06/18 2249 11/07/18 0622 11/08/18 0527  NA 136  --  138  K 4.1  --  3.7  CL 100  --  99  CO2 20*  --  28  GLUCOSE 121*  --  126*  BUN 21*  --  15  CREATININE 1.51* 1.50* 1.28*  CALCIUM 9.7  --  9.1  MG 1.9  --   --    CBC: Recent Labs    11/06/18 2249  WBC 10.6*  HGB 14.5  HCT 42.9  MCV 89.7  PLT 172   Cardiac Enzymes: Recent Labs    11/07/18 0523 11/07/18 1044 11/07/18 1609  TROPONINI <0.03 <0.03 <0.03   BNP: Invalid input(s): POCBNP D-Dimer: Recent Labs    11/06/18 2314  DDIMER 0.56*   Hemoglobin A1C: Recent Labs    11/08/18 0527  HGBA1C 6.8*   Fasting Lipid Panel: Recent Labs    11/08/18 0527  CHOL 200  HDL 29*  LDLCALC UNABLE TO CALCULATE IF TRIGLYCERIDE OVER 400 mg/dL  TRIG 519*  CHOLHDL 6.9  LDLDIRECT 78.4   Thyroid Function Tests: Recent Labs    11/06/18 2313  TSH 2.400   Anemia Panel: No results for input(s): VITAMINB12, FOLATE, FERRITIN,  TIBC, IRON, RETICCTPCT in the last 72 hours.  Imaging: Dg Chest 1 View  Result Date: 11/06/2018 CLINICAL DATA:  Chest pain EXAM: CHEST  1 VIEW COMPARISON:  None. FINDINGS: The heart size and mediastinal contours are within normal limits. Both lungs are clear. The visualized skeletal structures are unremarkable. IMPRESSION: No active disease. Electronically Signed   By: Donavan Foil M.D.   On: 11/06/2018 22:56   Ct Angio Chest Pe W And/or Wo Contrast  Result Date: 11/07/2018 CLINICAL DATA:  48 year old male with chest pain shortness of breath and diaphoresis. EXAM: CT ANGIOGRAPHY CHEST WITH CONTRAST TECHNIQUE: Multidetector CT imaging of the chest was performed using the standard protocol during bolus administration of intravenous contrast. Multiplanar CT image reconstructions and MIPs were obtained to evaluate the vascular anatomy. CONTRAST:  70 milliliters OMNIPAQUE IOHEXOL 350 MG/ML SOLN COMPARISON:  Portable chest 11/06/2018. FINDINGS: Cardiovascular: Excellent contrast bolus timing in the pulmonary arterial tree. Mild streak artifact in the chest. No focal filling defect identified in the pulmonary arteries to suggest acute pulmonary embolism. Evidence of calcified coronary artery atherosclerosis on series 7, image  180. Mild cardiomegaly. No pericardial effusion. Negative visible aorta. Mediastinum/Nodes: Negative. No lymphadenopathy. Lungs/Pleura: Major airways are patent. Minimal pulmonary atelectasis. No pleural effusion or other abnormal pulmonary opacity. Upper Abdomen: Hepatic steatosis. Negative visible gallbladder, spleen, pancreas, adrenal glands, kidneys, and bowel. Musculoskeletal: Negative. Review of the MIP images confirms the above findings. IMPRESSION: 1. Negative for acute pulmonary embolus. 2. Calcified coronary artery atherosclerosis. Mild cardiomegaly. No pericardial effusion. 3. Minimal pulmonary atelectasis. 4. Hepatic steatosis. Electronically Signed   By: Genevie Ann M.D.   On:  11/07/2018 01:05    Cardiac Studies:  ECG:  Appears to be sinus tachycardia    Telemetry: SR/ST   Echo: pending   Medications:    heparin  5,000 Units Subcutaneous Q8H   metoprolol tartrate  50 mg Oral BID   nitroGLYCERIN  0.1 mg Transdermal Daily      Assessment/Plan:   Arrhythmia :  Appears to be inappropriate "sinus" tachycardia. Labs are fine Hct, TSH, urine drug screen normal. Echo with low normal EF Mitral inflow pattern shows abnormal relaxation with fused E/A waves. P wave morphology on ECG is normal. No change in rhythm with adenosine in ER speaks against re-entrant rhythm. No changes on my exam with valsalva or CSM. He is obese and sedentary and may just be extreme end of bell shaped curve for normal. However I have seen abnormal ectopic atrial tachycardia with location near the normal SA node that presents like this. Diagnosis would require EP study. Have asked EP to see Calcium blockers D/C started on lopressor 50 bid Escalate dose over weekend Consider Corlanor if rate remains high       Rodney Lane 11/08/2018, 8:31 AM

## 2018-11-08 NOTE — Progress Notes (Signed)
Came in to check on pt, pt c/o blurred vision, unable to swallow. MD made aware, ordered head CT stat, code stroke called.

## 2018-11-08 NOTE — Consult Note (Addendum)
Cardiology Consultation:   Patient ID: Rodney Lane MRN: [id number]; DOB: 1970/09/21  Admit date: 11/06/2018 Date of Consult: 11/08/2018  Primary Care Provider: System, Pcp Not In Primary Cardiologist: New to [facility], Dr. Johnsie Cancel Primary Electrophysiologist:  New, Dr. Curt Bears   Patient Profile:   Rodney Lane is a 48 y.o. male with a hx of HTN, morbid obesity (BMI is 59) who is being seen today for the evaluation of an SVT at the request of Dr. Johnsie Cancel.  History of Present Illness:   Rodney Lane sought care at the [facility] 10/31/2018 with c/o SOB since mid-May, initially felt to have bronchitis, no reported CP, palpitations, no cough, fever or symptoms of illness, ordered for labs it seems including COVID testing, CXR, there is mention of tachycardia, and seems perhaps his amlodipine changed to diltiazem 114m and added HCTZ 25 I do not see any results/outcomes from this visit  [facility] with acute onset of worsened SOB, CP, palpitations.  In the ER noted to be in ST 150's treated initially with adenosine that had no effect, then IVF, ASA and NTG, rates improved and symptoms improved as well.  CT chest negative for PE, his COVD test here negative, no obvious signs of infection, afebrile, WBC wnl. He also had a TSH that was wnl.  Cardiology was consulted and saw the patient yesterday, echo with LVEF 50-55%, no WMA described, +impaired relaxation, no pericardial effusion.  Pt declined stress test secondary to bad past experience, body habitus and tachycardia precludes coronary CT, did not feel invasive/cath warranted.  Dr. NJohnsie Cancelultimatly did not feel ischemic evaluation was needed acutely with thoughts towards ETT or CT when COVID restrictions allow Unclear etiology of his tachycardia, no symptoms or findings of acute illness, Tox was negative, no PE< normal TSH.  Planned for ESR, CPR, and ANA.  Dilt was stopped, metoprolol was added to his regime with some discussion of  Corlanor if need Dr. NJohnsie Cancelattempted vagal maneuvers and CSM without changes to rate/rhythm EP is asked to evaluate for consideration of EP study to determine if this is an atrial tach rather then sinus.  LABS K+ 4.1 > 3.7 Mag 1.9 BUN/Creat 21/1.51 >> 15/1.28 Trop I: <0.03 (x3) Procalcitonin 0.16 WBC 10.6 H/H 14/42 Plts 172 Ddimer 0.56 > neg CT chest for PE TSH 2.400 CRP 6.9 (high) ESR 27 (high) Urine Tox negative  Past Medical History:  Diagnosis Date   HTN (hypertension)    Tachycardia    Not sure why it goes fast, but someone put him on cardizem.  Appears to be S.tach    History reviewed. No pertinent surgical history.   Home Medications:  Prior to Admission medications   Medication Sig Start Date End Date Taking? Authorizing Provider  albuterol (VENTOLIN HFA) 108 (90 Base) MCG/ACT inhaler Inhale 1-2 puffs into the lungs every 6 (six) hours as needed for wheezing or shortness of breath.   Yes [provider]  amLODipine (NORVASC) 5 MG tablet Take 5 mg by mouth daily.  10/25/18  Yes [provider]  CARDIZEM LA 120 MG 24 hr tablet Take 120 mg by mouth daily. 11/01/18  Yes [provider]  cetirizine (ZYRTEC) 10 MG tablet Take 10 mg by mouth daily. 10/25/18  Yes [provider]  hydrochlorothiazide (HYDRODIURIL) 25 MG tablet Take 25 mg by mouth daily. 10/25/18  Yes [provider]    Inpatient Medications: Scheduled Meds:  heparin  5,000 Units Subcutaneous Q8H   metoprolol tartrate  50 mg Oral BID   nitroGLYCERIN  0.1 mg Transdermal Daily   Continuous Infusions:  PRN Meds: acetaminophen, morphine injection, ondansetron (ZOFRAN) IV  Allergies:   No Known Allergies  Social History:   Social History   Socioeconomic History   Marital status: Single    Spouse name: Not on file   Number of children: Not on file   Years of education: Not on file   Highest education level: Not on file  Occupational History   Not  on file  Social Needs   Financial resource strain: Not on file   Food insecurity    Worry: Not on file    Inability: Not on file   Transportation needs    Medical: Not on file    Non-medical: Not on file  Tobacco Use   Smoking status: Never Smoker  Substance and Sexual Activity   Alcohol use: Never    Frequency: Never   Drug use: Never   Sexual activity: Not on file  Lifestyle   Physical activity    Days per week: Not on file    Minutes per session: Not on file   Stress: Not on file  Relationships   Social connections    Talks on phone: Not on file    Gets together: Not on file    Attends religious service: Not on file    Active member of club or organization: Not on file    Attends meetings of clubs or organizations: Not on file    Relationship status: Not on file   Intimate partner violence    Fear of current or ex partner: Not on file    Emotionally abused: Not on file    Physically abused: Not on file    Forced sexual activity: Not on file  Other Topics Concern   Not on file  Social History Narrative   Not on file    Family History:   Family History  Problem Relation Age of Onset   Cancer Mother    Cancer Father    Diabetes Father    Heart disease Maternal Grandfather    Heart disease Paternal Grandfather      ROS:  Please see the history of present illness.  All other ROS reviewed and negative.     Physical Exam/Data:   Vitals:   11/07/18 2010 11/08/18 0010 11/08/18 0449 11/08/18 0851  BP: 130/84 139/77 123/88 125/89  Pulse: (!) 117 (!) 107 (!) 124   Resp:   (!) 24   Temp: 99.9 F (37.7 C) 98.5 F (36.9 C) 98.9 F (37.2 C)   TempSrc: Oral Oral Oral   SpO2: 97% 97% 99%   Weight:   135.4 kg   Height:       No intake or output data in the 24 hours ending 11/08/18 0920 Last 3 Weights 11/08/2018 11/06/2018 11/06/2018  Weight (lbs) 298 lb 8 oz 280 lb 280 lb  Weight (kg) 135.399 kg 127.007 kg 127.007 kg     Body mass index is  44.08 kg/m.  General:  Well nourished, well developed, in no acute distress HEENT: normal Lymph: no adenopathy Neck: no JVD Endocrine:  No thryomegaly Vascular: No carotid bruits Cardiac:  RRR; tachycardia, no murmurs, gallops or rubs Lungs:  CTA b/l, no wheezing, rhonchi or rales  Abd: obese  Ext: no edema Musculoskeletal:  No deformities Skin: warm and dry  Neuro:  no focal abnormalities noted Psych:  Normal affect   EKG:  The EKG was  personally reviewed and demonstrates:   ST, 147bpm, no ischemic looking changes Today is ST 104bpm Telemetry:  Telemetry was personally reviewed and demonstrates:   ST 100-110  Relevant CV Studies:  11/07/2018: TTE IMPRESSIONS 1. The left ventricle has low normal systolic function, with an ejection fraction of 50-55%. The cavity size was normal. There is mildly increased left ventricular wall thickness. Left ventricular diastolic Doppler parameters are consistent with  impaired relaxation.  2. The right ventricle has normal systolic function. The cavity was normal. There is no increase in right ventricular wall thickness.  3. The aortic valve was not well visualized.  FINDINGS  Left Ventricle: The left ventricle has low normal systolic function, with an ejection fraction of 50-55%. The cavity size was normal. There is mildly increased left ventricular wall thickness. Left ventricular diastolic Doppler parameters are consistent  with impaired relaxation.  Right Ventricle: The right ventricle has normal systolic function. The cavity was normal. There is no increase in right ventricular wall thickness.  Left Atrium: Left atrial size was normal in size.  Right Atrium: Right atrial size was normal in size. Right atrial pressure is estimated at 10 mmHg.  Interatrial Septum: No atrial level shunt detected by color flow Doppler.  Pericardium: There is no evidence of pericardial effusion.  Mitral Valve: The mitral valve was not well  visualized. Mitral valve regurgitation is not visualized by color flow Doppler.  Tricuspid Valve: The tricuspid valve is normal in structure. Tricuspid valve regurgitation was not visualized by color flow Doppler.  Aortic Valve: The aortic valve was not well visualized Aortic valve regurgitation was not visualized by color flow Doppler. There is no evidence of aortic valve stenosis.  Pulmonic Valve: The pulmonic valve was normal in structure. Pulmonic valve regurgitation is not visualized by color flow Doppler.  Venous: The inferior vena cava is normal in size with greater than 50% respiratory variability.   Laboratory Data:  Chemistry Recent Labs  Lab 11/06/18 2249 11/07/18 0622 11/08/18 0527  NA 136  --  138  K 4.1  --  3.7  CL 100  --  99  CO2 20*  --  28  GLUCOSE 121*  --  126*  BUN 21*  --  15  CREATININE 1.51* 1.50* 1.28*  CALCIUM 9.7  --  9.1  GFRNONAA 54* 54* >60  GFRAA >60 >60 >60  ANIONGAP 16*  --  11    No results for input(s): PROT, ALBUMIN, AST, ALT, ALKPHOS, BILITOT in the last 168 hours. Hematology Recent Labs  Lab 11/06/18 2249  WBC 10.6*  RBC 4.78  HGB 14.5  HCT 42.9  MCV 89.7  MCH 30.3  MCHC 33.8  RDW 13.0  PLT 172   Cardiac Enzymes Recent Labs  Lab 11/06/18 2249 11/07/18 0523 11/07/18 1044 11/07/18 1609  TROPONINI <0.03 <0.03 <0.03 <0.03   No results for input(s): TROPIPOC in the last 168 hours.  BNPNo results for input(s): BNP, PROBNP in the last 168 hours.  DDimer  Recent Labs  Lab 11/06/18 2314  DDIMER 0.56*    Radiology/Studies:   Dg Chest 1 View Result Date: 11/06/2018 CLINICAL DATA:  Chest pain EXAM: CHEST  1 VIEW COMPARISON:  None. FINDINGS: The heart size and mediastinal contours are within normal limits. Both lungs are clear. The visualized skeletal structures are unremarkable. IMPRESSION: No active disease. Electronically Signed   By: Donavan Foil M.D.   On: 11/06/2018 22:56     Ct Angio Chest Pe W And/or Wo  Contrast Result Date: 11/07/2018 CLINICAL DATA:  48 year old male with chest pain shortness of breath and diaphoresis. EXAM: CT ANGIOGRAPHY CHEST WITH CONTRAST TECHNIQUE: Multidetector CT imaging of the chest was performed using the standard protocol during bolus administration of intravenous contrast. Multiplanar CT image reconstructions and MIPs were obtained to evaluate the vascular anatomy. CONTRAST:  70 milliliters OMNIPAQUE IOHEXOL 350 MG/ML SOLN COMPARISON:  Portable chest 11/06/2018. FINDINGS: Cardiovascular: Excellent contrast bolus timing in the pulmonary arterial tree. Mild streak artifact in the chest. No focal filling defect identified in the pulmonary arteries to suggest acute pulmonary embolism. Evidence of calcified coronary artery atherosclerosis on series 7, image 180. Mild cardiomegaly. No pericardial effusion. Negative visible aorta. Mediastinum/Nodes: Negative. No lymphadenopathy. Lungs/Pleura: Major airways are patent. Minimal pulmonary atelectasis. No pleural effusion or other abnormal pulmonary opacity. Upper Abdomen: Hepatic steatosis. Negative visible gallbladder, spleen, pancreas, adrenal glands, kidneys, and bowel. Musculoskeletal: Negative. Review of the MIP images confirms the above findings. IMPRESSION: 1. Negative for acute pulmonary embolus. 2. Calcified coronary artery atherosclerosis. Mild cardiomegaly. No pericardial effusion. 3. Minimal pulmonary atelectasis. 4. Hepatic steatosis. Electronically Signed   By: Genevie Ann M.D.   On: 11/07/2018 01:05    Assessment and Plan:   1. Suspect is sinus tachycardia     Hard to tell, though P waves look similar in slower rate     No HR change with adenosine, CSM, or vagal maneuvers       Dr. Curt Bears has seen and examined the patient The patient would very much prefer to avoid any procedures Recently ill with a bronhchitis Suspect he was dehydrated with addition of diuretic Morbid obesity Orthostatics yesterday were  positive   He feels "washed out"  this AM, perhaps 2/2 the betablocker as well    Jerek Meulemans change him back to diltiazem though 217m daily dose Discussed with IM Plans to hydrate further, recheck orthostatics today and if negative OK the dilt to be given. IM Phylicia Mcgaugh look into elevated CRP, ESR    EP Maiah Sinning sign off, defer further management with attending cardiologist and IM team We remain available, please recall if needed.     For questions or updates, please contact CCarmenPlease consult [email or web address] for contact info under     Signed, RBaldwin Jamaica PA-C  11/08/2018 9:20 AM  I have seen and examined this patient with RTommye Standard  Agree with above, note added to reflect my findings.  On exam, RRR, no murmurs, lungs clear.  Patient presented to the hospital with weakness and fatigue, found to be tachycardic with heart rates into the high 140s.  He was given 6 mg of adenosine without much change in his rhythm.  He was then given 2 L of normal saline which reduced his heart rate down to the 120s.  There are some atrial tachycardias that are responsive to adenosine, but this is not a specific finding.  The fact that his heart rate came down with normal saline makes me think that he has sinus tachycardia, but I cannot find a cause.  It is certainly possible that he has an atrial tachycardia close to his sinus node.  He would like to avoid procedures in EP studies.  Would thus increase his diltiazem to 240 mg to see if this Khoen Genet help to control his rhythm.  I am worried that he Breyon Sigg get symptomatic with weakness and fatigue on metoprolol.  Also agree with continued hydration.  Alize Borrayo M. Tomorrow Dehaas MD 11/08/2018 1:15 PM

## 2018-11-09 DIAGNOSIS — R509 Fever, unspecified: Secondary | ICD-10-CM

## 2018-11-09 DIAGNOSIS — N179 Acute kidney failure, unspecified: Secondary | ICD-10-CM

## 2018-11-09 LAB — CBC
HCT: 38.1 % — ABNORMAL LOW (ref 39.0–52.0)
Hemoglobin: 12.6 g/dL — ABNORMAL LOW (ref 13.0–17.0)
MCH: 30.1 pg (ref 26.0–34.0)
MCHC: 33.1 g/dL (ref 30.0–36.0)
MCV: 91.1 fL (ref 80.0–100.0)
Platelets: 161 10*3/uL (ref 150–400)
RBC: 4.18 MIL/uL — ABNORMAL LOW (ref 4.22–5.81)
RDW: 13.2 % (ref 11.5–15.5)
WBC: 6.8 10*3/uL (ref 4.0–10.5)
nRBC: 0 % (ref 0.0–0.2)

## 2018-11-09 LAB — BASIC METABOLIC PANEL
Anion gap: 9 (ref 5–15)
BUN: 12 mg/dL (ref 6–20)
CO2: 25 mmol/L (ref 22–32)
Calcium: 8.7 mg/dL — ABNORMAL LOW (ref 8.9–10.3)
Chloride: 102 mmol/L (ref 98–111)
Creatinine, Ser: 1.26 mg/dL — ABNORMAL HIGH (ref 0.61–1.24)
GFR calc Af Amer: >60 mL/min (ref >60)
GFR calc non Af Amer: >60 mL/min (ref >60)
Glucose, Bld: 109 mg/dL — ABNORMAL HIGH (ref 70–99)
Potassium: 3.6 mmol/L (ref 3.5–5.1)
Sodium: 136 mmol/L (ref 135–145)

## 2018-11-09 LAB — ANA W/REFLEX IF POSITIVE: Anti Nuclear Antibody (ANA): NEGATIVE

## 2018-11-09 MED ORDER — DOXYCYCLINE HYCLATE 100 MG PO TABS: 100.0000 mg | ORAL_TABLET | Freq: Two times a day (BID) | ORAL | Status: DC

## 2018-11-09 MED ORDER — HYDROXYZINE HCL 10 MG PO TABS
10.0000 mg | ORAL_TABLET | Freq: Two times a day (BID) | ORAL | Status: DC
  Administered 2018-11-09 – 2018-11-10 (×2): 10 mg via ORAL
  Filled 2018-11-09 (×2): qty 1

## 2018-11-09 MED ORDER — CEFDINIR 300 MG PO CAPS
300.0000 mg | ORAL_CAPSULE | Freq: Two times a day (BID) | ORAL | Status: DC
  Administered 2018-11-09 – 2018-11-10 (×3): 300 mg via ORAL
  Filled 2018-11-09 (×3): qty 1

## 2018-11-09 NOTE — Progress Notes (Signed)
[facility] will sign off.   Medication Recommendations: Cardizem CD 240 mg daily started by EP (Dr. Curt Bears)   Other recommendations (labs, testing, etc):  NA Follow up as an outpatient:  Will arrange EP follow up

## 2018-11-09 NOTE — Progress Notes (Signed)
PROGRESS NOTE    Rodney Lane  LTJ:[id number]  DOB: 1970-08-07  DOA: 11/06/2018 PCP: System, Pcp Not In  Brief Narrative:  48 y.o.malewith medical history significant ofHTN, obesity, tachycardia being treated with cardizem by PCP.Patient presents to the ED with c/o SOB and CP. HR in 150s, received adenosine with no change in rhythm and implied sinus tach.  His heart rate did improve and was in the 110s at the time of admission to the medical floor. Patient reports several weeks of increasing exertional dyspnea relieved by rest. Unable to walk from mailbox to home without taking a break.  He also reports lightheadedness while walking.  States had a negative stress test many years back.EKG NSR and trop x2 -ve. Lab work revealed creatinine of 1.5 with no prior baseline to compare.  Patient had persistent chest tightness at the time of admission and Nitropatch added/cardiology consulted.  Cards consult pending. Echo ordered. TSH WNL.  Subjective:   He states chest discomfort has improved but still feels dyspneic on and off.  He is saturating well on room air.  He is also complaining of sore throat with pain on swallowing.  He had a low-grade fever last night and early this morning with T-max of 100.8.  Objective: Vitals:   11/09/18 0410 11/09/18 0942 11/09/18 1329 11/09/18 1456  BP: 128/81 125/79 134/87 124/82  Pulse: (!) 109  (!) 109 (!) 102  Resp: (!) 24     Temp: 99.1 F (37.3 C)   98 F (36.7 C)  TempSrc: Oral   Oral  SpO2: 96%  96% 95%  Weight: (!) 136.4 kg     Height:        Intake/Output Summary (Last 24 hours) at 11/09/2018 1640 Last data filed at 11/09/2018 1332 Gross per 24 hour  Intake 1480 ml  Output 300 ml  Net 1180 ml   Filed Weights   11/06/18 2216 11/08/18 0449 11/09/18 0410  Weight: 127 kg 135.4 kg (!) 136.4 kg    Physical Examination:  General exam: Appears calm and comfortable  ENT: Pharyngeal erythema with?  Pustule in the left peritonsillar  area Respiratory system: Clear to auscultation. Respiratory effort normal. Cardiovascular system: S1 & S2 heard, RRR. No JVD, murmurs, rubs, gallops or clicks. No pedal edema. Gastrointestinal system: Abdomen is obese, nontender. No organomegaly or masses felt. Normal bowel sounds heard. Central nervous system: Alert and oriented. No focal neurological deficits. Extremities: Symmetric 5 x 5 power. Skin: No rashes, lesions or ulcers Psychiatry: Judgement and insight appear normal. Mood & affect anxious.     Data Reviewed: I have personally reviewed following labs and imaging studies  CBC: Recent Labs  Lab 11/06/18 2249 11/09/18 1152  WBC 10.6* 6.8  HGB 14.5 12.6*  HCT 42.9 38.1*  MCV 89.7 91.1  PLT 172 076   Basic Metabolic Panel: Recent Labs  Lab 11/06/18 2249 11/07/18 0622 11/08/18 0527 11/09/18 1117  NA 136  --  138 136  K 4.1  --  3.7 3.6  CL 100  --  99 102  CO2 20*  --  28 25  GLUCOSE 121*  --  126* 109*  BUN 21*  --  15 12  CREATININE 1.51* 1.50* 1.28* 1.26*  CALCIUM 9.7  --  9.1 8.7*  MG 1.9  --   --   --    GFR: Estimated Creatinine Clearance: 98.4 mL/min (A) (by C-G formula based on SCr of 1.26 mg/dL (H)). Liver Function Tests: No results for input(s): AST,  ALT, ALKPHOS, BILITOT, PROT, ALBUMIN in the last 168 hours. No results for input(s): LIPASE, AMYLASE in the last 168 hours. No results for input(s): AMMONIA in the last 168 hours. Coagulation Profile: No results for input(s): INR, PROTIME in the last 168 hours. Cardiac Enzymes: Recent Labs  Lab 11/06/18 2249 11/07/18 0523 11/07/18 1044 11/07/18 1609  TROPONINI <0.03 <0.03 <0.03 <0.03   BNP (last 3 results) No results for input(s): PROBNP in the last 8760 hours. HbA1C: Recent Labs    11/08/18 0527  HGBA1C 6.8*   CBG: Recent Labs  Lab 11/08/18 1828  GLUCAP 175*   Lipid Profile: Recent Labs    11/08/18 0527  CHOL 200  HDL 29*  LDLCALC UNABLE TO CALCULATE IF TRIGLYCERIDE OVER 400  mg/dL  TRIG 519*  CHOLHDL 6.9  LDLDIRECT 78.4   Thyroid Function Tests: Recent Labs    11/06/18 2313  TSH 2.400   Anemia Panel: No results for input(s): VITAMINB12, FOLATE, FERRITIN, TIBC, IRON, RETICCTPCT in the last 72 hours. Sepsis Labs: Recent Labs  Lab 11/07/18 0641  PROCALCITON 0.16    Recent Results (from the past 240 hour(s))  SARS Coronavirus 2     Status: None   Collection Time: 11/06/18 11:39 PM  Result Value Ref Range Status   SARS Coronavirus 2 NOT DETECTED NOT DETECTED Final    Comment: (NOTE) SARS-CoV-2 target nucleic acids are NOT DETECTED. The SARS-CoV-2 RNA is generally detectable in upper and lower respiratory specimens during the acute phase of infection.  Negative  results do not preclude SARS-CoV-2 infection, do not rule out co-infections with other pathogens, and should not be used as the sole basis for treatment or other patient management decisions.  Negative results must be combined with clinical observations, patient history, and epidemiological information. The expected result is Not Detected. Fact Sheet for Patients: http://www.biofiredefense.com/wp-content/uploads/2020/03/BIOFIRE-COVID -19-patients.pdf Fact Sheet for Healthcare Providers: http://www.biofiredefense.com/wp-content/uploads/2020/03/BIOFIRE-COVID -19-hcp.pdf This test is not yet approved or cleared by the [place] and  has been authorized for detection and/or diagnosis of SARS-CoV-2 by FDA under an Emergency Use Authorization (EUA).  This EUA will remain in effec t (meaning this test can be used) for the duration of  the COVID-19 declaration under Section 564(b)(1) of the Act, 21 U.S.C. section 360bbb-3(b)(1), unless the authorization is terminated or revoked sooner. Performed at [facility], [place] [address] [place], [place] [address]   Urine culture     Status: None   Collection Time: 11/07/18 12:25 AM   Specimen: Urine, Random  Result Value Ref Range  Status   Specimen Description URINE, RANDOM  Final   Special Requests NONE  Final   Culture   Final    NO GROWTH Performed at [facility], [place] [address], [place], [place] [address]    Report Status 11/08/2018 FINAL  Final  Culture, group A strep     Status: None (Preliminary result)   Collection Time: 11/08/18 12:03 PM   Specimen: Throat  Result Value Ref Range Status   Specimen Description THROAT  Final   Special Requests NONE  Final   Culture   Final    TOO YOUNG TO READ Performed at [facility], [address] [place], [place] [address]    Report Status PENDING  Incomplete      Radiology Studies: Mr Virgel Paling [place] Contrast  Result Date: 11/08/2018 CLINICAL DATA:  Blurry vision EXAM: MRI HEAD WITHOUT CONTRAST MRA HEAD WITHOUT CONTRAST TECHNIQUE: Multiplanar, multiecho pulse sequences of  the brain and surrounding structures were obtained without intravenous contrast. Angiographic images of the head were obtained using MRA technique without contrast. COMPARISON:  None. FINDINGS: MRI HEAD FINDINGS BRAIN: There is no acute infarct, acute hemorrhage or extra-axial collection. The midline structures are normal. No midline shift or other mass effect. The white matter signal is normal for the patient's age. The cerebral and cerebellar volume are age-appropriate. No hydrocephalus. Susceptibility-sensitive sequences show no chronic microhemorrhage or superficial siderosis. No mass lesion. VASCULAR: The major intracranial arterial and venous sinus flow voids are normal. SKULL AND UPPER CERVICAL SPINE: Calvarial bone marrow signal is normal. There is no skull base mass. Visualized upper cervical spine and soft tissues are normal. SINUSES/ORBITS: Polypoid mucosal thickening of both maxillary sinuses. No mastoid or middle ear effusion. The orbits are normal. MRA HEAD FINDINGS POSTERIOR CIRCULATION: --Vertebral arteries: Normal V4 segments. --Posterior inferior cerebellar arteries (PICA):  Patent origins from the vertebral arteries. --Anterior inferior cerebellar arteries (AICA): Patent origins from the basilar artery. --Basilar artery: Normal. --Superior cerebellar arteries: Normal. --Posterior cerebral arteries (PCA): Normal. Both originate from the basilar artery. Posterior communicating arteries (p-comm) are diminutive or absent. ANTERIOR CIRCULATION: --Intracranial internal carotid arteries: Normal. --Anterior cerebral arteries (ACA): Normal. Both A1 segments are present. Patent anterior communicating artery (a-comm). --Middle cerebral arteries (MCA): Normal. IMPRESSION: Normal MRI/MRA of the brain. Electronically Signed   By: Ulyses Jarred M.D.   On: 11/08/2018 20:47   Mr Brain Wo Contrast  Result Date: 11/08/2018 CLINICAL DATA:  Blurry vision EXAM: MRI HEAD WITHOUT CONTRAST MRA HEAD WITHOUT CONTRAST TECHNIQUE: Multiplanar, multiecho pulse sequences of the brain and surrounding structures were obtained without intravenous contrast. Angiographic images of the head were obtained using MRA technique without contrast. COMPARISON:  None. FINDINGS: MRI HEAD FINDINGS BRAIN: There is no acute infarct, acute hemorrhage or extra-axial collection. The midline structures are normal. No midline shift or other mass effect. The white matter signal is normal for the patient's age. The cerebral and cerebellar volume are age-appropriate. No hydrocephalus. Susceptibility-sensitive sequences show no chronic microhemorrhage or superficial siderosis. No mass lesion. VASCULAR: The major intracranial arterial and venous sinus flow voids are normal. SKULL AND UPPER CERVICAL SPINE: Calvarial bone marrow signal is normal. There is no skull base mass. Visualized upper cervical spine and soft tissues are normal. SINUSES/ORBITS: Polypoid mucosal thickening of both maxillary sinuses. No mastoid or middle ear effusion. The orbits are normal. MRA HEAD FINDINGS POSTERIOR CIRCULATION: --Vertebral arteries: Normal V4 segments.  --Posterior inferior cerebellar arteries (PICA): Patent origins from the vertebral arteries. --Anterior inferior cerebellar arteries (AICA): Patent origins from the basilar artery. --Basilar artery: Normal. --Superior cerebellar arteries: Normal. --Posterior cerebral arteries (PCA): Normal. Both originate from the basilar artery. Posterior communicating arteries (p-comm) are diminutive or absent. ANTERIOR CIRCULATION: --Intracranial internal carotid arteries: Normal. --Anterior cerebral arteries (ACA): Normal. Both A1 segments are present. Patent anterior communicating artery (a-comm). --Middle cerebral arteries (MCA): Normal. IMPRESSION: Normal MRI/MRA of the brain. Electronically Signed   By: Ulyses Jarred M.D.   On: 11/08/2018 20:47    Scheduled Meds:  cefdinir  300 mg Oral Q12H   diltiazem  240 mg Oral Daily   heparin  5,000 Units Subcutaneous Q8H   magic mouthwash  5 mL Oral TID   Continuous Infusions:  sodium chloride 100 mL/hr at 11/09/18 1332    Assessment & Plan:   1.  Chest pain/dyspnea: Now resolved. No significant EKG changes.  Troponin x3-.  Off Nitropatch given complaints of headache/blurred vision.  Echo  shows EF of 50 to 55%, mildly impaired LV relaxation. Patient refused stress test.  Seen by cardiology during the hospital course.  CT chest negative for PE and no evidence of pneumonia. COVID testing negative  2. Sinus tachycardia: Likely secondary to dehydration/anxiety/now fever.  Patient seen by cardiology who initially felt he may have atrial arrhythmia. Dr. Johnsie Cancel attempted vagal maneuvers and CSM without changes to rate/rhythm.  He was subsequently evaluated by electrophysiology who felt patient likely has sinus tachycardia as a physiological response to dehydration. TSH within normal limits.  Cardiology recommended higher dose diltiazem upon discharge.    3.  Acute kidney injury: In the setting of dehydration/diuretics.  Patient's creatinine on presentation was 1.5  which is improved to 1.26 today with IV fluids.  He was on hydrochlorothiazide as outpatient which has been held.  This could be his new normal creatinine.  Will reduce IV fluid rate and hydrate for another day.  4.?  Orthostatic hypotension: Off Norvasc.  DC Nitropatch.  Patient did have some orthostatic change on blood pressure checks in the ED.  Repeat checks showed systolic 203T with no orthostatic change.  5.  Sore throat/low-grade fever: Cultures for strep throat sent and started on empiric antibiotics.Will order Magic mouthwash.  Mild elevation in inflammatory markers might be secondary to this.  Repeat labs showed resolution of mild leukocytosis.  6.  Blurred vision: Now resolved.  Code stroke summoned yesterday due to his complaints of intermittent blurred vision.  CT head/MRI MRA of the head were negative. Appreciate neurology evaluation we have cleared him from their standpoint.  7.  Anxiety: Not sure if patient's complaints of #1 and #2 related to anxiety.  Patient has been noted to be anxious since presentation with subjective complaints much dramatic than objective findings.Will try hydroxyzine .   DVT prophylaxis: Lovenox Code Status: Full code Family / Patient Communication: Discussed with patient  Disposition Plan: Home in a.m. after another day of hydration/antibiotics if remains afebrile with stable labs.    LOS: 2 days    Time spent: 25 minutes  Guilford Shi, MD [facility] Pager [phone]  If 7PM-7AM, please contact night-coverage [email or web address] Password [facility] 11/09/2018, 4:40 PM

## 2018-11-09 NOTE — Progress Notes (Signed)
Rodney Lane  BJS:[id number]  DOB: 1970-08-21  DOA: 11/06/2018 PCP: System, Pcp Not In  Brief Narrative:  48 y.o.malewith medical history significant ofHTN, obesity,tachycardia being treated with cardizem by PCP.Patient presents to the ED with c/o SOB and CP.HR in 150s, received adenosine with no change in rhythm and implied sinus tach.  His heart rate did improve and was in the 110s at the time of admission to the medical floor. Patient reports several weeks of increasingexertional dyspnea relieved by rest. Unable to walk from mailbox to home without taking a break.  He also reports lightheadedness while walking.  States had a negative stress test many years back.EKG NSR and trop x2 -ve. Lab work revealed creatinine of 1.5 with no prior baseline to compare.  Patient had persistent chest tightness at the time of admission and Nitropatch added/cardiology consulted.  Cards consult pending. Echo ordered. TSH WNL.  Subjective:  Patient seen by cardiology yesterday (diltiazem changed to metoprolol)and electrophysiology today.  He states chest discomfort has improved but still feels dyspneic on walking.  He is saturating well on room air.  His main complaint today is headache associated with blurred vision which started after admission.  He is also complaining of sore throat with pain on swallowing.  Objective:       Vitals:   11/08/18 1205 11/08/18 1209 11/08/18 1212 11/08/18 1216  BP: 118/82 120/85 122/87 (!) 125/91  Pulse: (!) 108 (!) 106 (!) 109 (!) 117  Resp:      Temp:      TempSrc:      SpO2: 93% 95% 91% 97%  Weight:      Height:        Intake/Output Summary (Last 24 hours) at 11/08/2018 1302 Last data filed at 11/08/2018 1242    Gross per 24 hour  Intake 720 ml  Output -  Net 720 ml        Filed Weights   11/06/18 2208 11/06/18 2216 11/08/18 0449  Weight: 127 kg 127 kg 135.4 kg    Physical Examination:  General exam: Appears calm and  comfortable  ENT: Pharyngeal erythema with?  Pustule in the left peritonsillar area Respiratory system: Clear to auscultation. Respiratory effort normal. Cardiovascular system: S1 & S2 heard, RRR. No JVD, murmurs, rubs, gallops or clicks. No pedal edema. Gastrointestinal system: Abdomen is obese, nontender. No organomegaly or masses felt. Normal bowel sounds heard. Central nervous system: Alert and oriented. No focal neurological deficits. Extremities: Symmetric 5 x 5 power. Skin: No rashes, lesions or ulcers Psychiatry: Judgement and insight appear normal. Mood & affect anxious.     Data Reviewed: I have personally reviewed following labs and imaging studies  CBC: Last Labs      Recent Labs  Lab 11/06/18 2249  WBC 10.6*  HGB 14.5  HCT 42.9  MCV 89.7  PLT 172     Basic Metabolic Panel: Last Labs        Recent Labs  Lab 11/06/18 2249 11/07/18 0622 11/08/18 0527  NA 136  --  138  K 4.1  --  3.7  CL 100  --  99  CO2 20*  --  28  GLUCOSE 121*  --  126*  BUN 21*  --  15  CREATININE 1.51* 1.50* 1.28*  CALCIUM 9.7  --  9.1  MG 1.9  --   --      GFR: Estimated Creatinine Clearance: 96.4 mL/min (A) (by C-G formula based on SCr of 1.28 mg/dL (H)). Liver Function  Tests: Last Labs   No results for input(s): AST, ALT, ALKPHOS, BILITOT, PROT, ALBUMIN in the last 168 hours.   Last Labs   No results for input(s): LIPASE, AMYLASE in the last 168 hours.   Last Labs   No results for input(s): AMMONIA in the last 168 hours.   Coagulation Profile: Last Labs   No results for input(s): INR, PROTIME in the last 168 hours.   Cardiac Enzymes: Last Labs         Recent Labs  Lab 11/06/18 2249 11/07/18 0523 11/07/18 1044 11/07/18 1609  TROPONINI <0.03 <0.03 <0.03 <0.03     BNP (last 3 results) Recent Labs (within last 365 days)  No results for input(s): PROBNP in the last 8760 hours.   HbA1C: Recent Labs (last 2 labs)      Recent Labs    11/08/18 0527   HGBA1C 6.8*     CBG: Last Labs   No results for input(s): GLUCAP in the last 168 hours.   Lipid Profile: Recent Labs (last 2 labs)      Recent Labs    11/08/18 0527  CHOL 200  HDL 29*  LDLCALC UNABLE TO CALCULATE IF TRIGLYCERIDE OVER 400 mg/dL  TRIG 519*  CHOLHDL 6.9  LDLDIRECT 78.4     Thyroid Function Tests: Recent Labs (last 2 labs)      Recent Labs    11/06/18 2313  TSH 2.400     Anemia Panel: Recent Labs (last 2 labs)   No results for input(s): VITAMINB12, FOLATE, FERRITIN, TIBC, IRON, RETICCTPCT in the last 72 hours.   Sepsis Labs: Last Labs      Recent Labs  Lab 11/07/18 0641  PROCALCITON 0.16             Recent Results (from the past 240 hour(s))  SARS Coronavirus 2     Status: None   Collection Time: 11/06/18 11:39 PM  Result Value Ref Range Status   SARS Coronavirus 2 NOT DETECTED NOT DETECTED Final    Comment: (NOTE) SARS-CoV-2 target nucleic acids are NOT DETECTED. The SARS-CoV-2 RNA is generally detectable in upper and lower respiratory specimens during the acute phase of infection.  Negative  results do not preclude SARS-CoV-2 infection, do not rule out co-infections with other pathogens, and should not be used as the sole basis for treatment or other patient management decisions.  Negative results must be combined with clinical observations, patient history, and epidemiological information. The expected result is Not Detected. Fact Sheet for Patients: http://www.biofiredefense.com/wp-content/uploads/2020/03/BIOFIRE-COVID -19-patients.pdf Fact Sheet for Healthcare Providers: http://www.biofiredefense.com/wp-content/uploads/2020/03/BIOFIRE-COVID -19-hcp.pdf This test is not yet approved or cleared by the [place] and  has been authorized for detection and/or diagnosis of SARS-CoV-2 by FDA under an Emergency Use Authorization (EUA).  This EUA will remain in effec t (meaning this test can be used) for the duration of   the COVID-19 declaration under Section 564(b)(1) of the Act, 21 U.S.C. section 360bbb-3(b)(1), unless the authorization is terminated or revoked sooner. Performed at [facility], [place] [address], [place], [place] [address]   Urine culture     Status: None   Collection Time: 11/07/18 12:25 AM   Specimen: Urine, Random  Result Value Ref Range Status   Specimen Description URINE, RANDOM  Final   Special Requests NONE  Final   Culture   Final    NO GROWTH Performed at [facility], [place] [address], [place], [place] [address]    Report Status 11/08/2018  FINAL  Final      Radiology Studies:  Imaging Results (Last 48 hours)  Dg Chest 1 View  Result Date: 11/06/2018 CLINICAL DATA:  Chest pain EXAM: CHEST  1 VIEW COMPARISON:  None. FINDINGS: The heart size and mediastinal contours are within normal limits. Both lungs are clear. The visualized skeletal structures are unremarkable. IMPRESSION: No active disease. Electronically Signed   By: Donavan Foil M.D.   On: 11/06/2018 22:56   Ct Angio Chest Pe W And/or Wo Contrast  Result Date: 11/07/2018 CLINICAL DATA:  48 year old male with chest pain shortness of breath and diaphoresis. EXAM: CT ANGIOGRAPHY CHEST WITH CONTRAST TECHNIQUE: Multidetector CT imaging of the chest was performed using the standard protocol during bolus administration of intravenous contrast. Multiplanar CT image reconstructions and MIPs were obtained to evaluate the vascular anatomy. CONTRAST:  70 milliliters OMNIPAQUE IOHEXOL 350 MG/ML SOLN COMPARISON:  Portable chest 11/06/2018. FINDINGS: Cardiovascular: Excellent contrast bolus timing in the pulmonary arterial tree. Mild streak artifact in the chest. No focal filling defect identified in the pulmonary arteries to suggest acute pulmonary embolism. Evidence of calcified coronary artery atherosclerosis on series 7, image 180. Mild cardiomegaly. No pericardial effusion. Negative visible aorta.  Mediastinum/Nodes: Negative. No lymphadenopathy. Lungs/Pleura: Major airways are patent. Minimal pulmonary atelectasis. No pleural effusion or other abnormal pulmonary opacity. Upper Abdomen: Hepatic steatosis. Negative visible gallbladder, spleen, pancreas, adrenal glands, kidneys, and bowel. Musculoskeletal: Negative. Review of the MIP images confirms the above findings. IMPRESSION: 1. Negative for acute pulmonary embolus. 2. Calcified coronary artery atherosclerosis. Mild cardiomegaly. No pericardial effusion. 3. Minimal pulmonary atelectasis. 4. Hepatic steatosis. Electronically Signed   By: Genevie Ann M.D.   On: 11/07/2018 01:05     Scheduled Meds: . diltiazem  240 mg Oral Daily  . heparin  5,000 Units Subcutaneous Q8H   Continuous Infusions: . sodium chloride 100 mL/hr at 11/08/18 1219    Assessment & Plan:   1.  Chest pain: Now resolved. No significant EKG changes.  Troponin x3-. DC Nitropatch given complaints of headache/blurred vision.  Echo shows EF of 50 to 55%, mildly impaired LV relaxation. Patient refused stress test.  Seen by cardiology.  2. Sinus tachycardia: Patient seen by cardiology who felt he may have atrial arrhythmia. Dr. Johnsie Cancel attempted vagal maneuvers and CSM without changes to rate/rhythm. Evaluated by electrophysiology and per my discussion with them, patient likely has sinus tachycardia as a physiological response to dehydration/Possible orthostasis.  TSH within normal limits.  Afebrile.  He does complain of headache today but new since addition of Nitropatch.  Will give IV hydration for dehydration/AKI.  He did receive some fluids yesterday.  Cardiology recommends resuming diltiazem at higher dose to help with tachycardia/palpitations.  3.  Acute kidney injury: Patient's creatinine on presentation was 1.5 which is improved to 1.28 today.  He was on hydrochlorothiazide as outpatient which has been held.  Will give additional fluids today and repeat labs in a.m.  4.?   Orthostatic hypotension: Off Norvasc.  DC Nitropatch.  Patient did have some orthostatic change on blood pressure checks in the ED.  Repeat checks today show systolic 496P with no orthostatic change.  5.  Sore throat: Will check strep throat.  Will order Magic mouthwash.  Mild elevation in inflammatory markers might be secondary to this.  Will repeat in a.m.  DVT prophylaxis: Lovenox Code Status: Full code Family / Patient Communication: Discussed with patient and electrophysiology team Disposition Plan: Home when medically cleared     LOS: 1  day    Time spent: 35 minutes  Addendum 6/12 7 pm : Patient complained of blurred vision this evening and code stroke someone.  On my evaluation patient reports resolution of headache after DC Nitropatch but intermittent blurred vision (describes as words on the wall appearing fuzzy).  On exam, PERRLA, no ptosis, extraocular movements intact, peripheral vision intact and no other focal signs and extremities with strength/sensations intact.  Stat CT head ordered.  Blood glucose 170.  Blood pressure 130/70.  Subsequently patient seen by neurology and further imaging ordered.  Please refer to their full consult note.  Will endorse to on-call APP to follow-up.

## 2018-11-09 NOTE — Progress Notes (Signed)
Normal MRI noted, please call if neurology can be of further assistance.   Roland Rack, MD [facility] [phone]  If 7pm- 7am, please page neurology on call as listed in [place].

## 2018-11-10 MED ORDER — CEFDINIR 300 MG PO CAPS: 300.0000 mg | ORAL_CAPSULE | Freq: Two times a day (BID) | ORAL | 0 refills | Status: AC

## 2018-11-10 MED ORDER — DILTIAZEM HCL ER COATED BEADS 240 MG PO CP24: 240.0000 mg | ORAL_CAPSULE | Freq: Every day | ORAL | 2 refills | Status: DC

## 2018-11-10 MED ORDER — MAGIC MOUTHWASH: 5.0000 mL | Freq: Three times a day (TID) | ORAL | 1 refills | Status: DC | PRN

## 2018-11-10 MED ORDER — HYDROXYZINE HCL 10 MG PO TABS: 10.0000 mg | ORAL_TABLET | Freq: Two times a day (BID) | ORAL | 0 refills | Status: DC | PRN

## 2018-11-10 NOTE — Discharge Instructions (Signed)
Schedule an appointment with your Primary care physician in 1 week.  YOUR CARDIOLOGY TEAM HAS ARRANGED FOR AN E-VISIT FOR YOUR APPOINTMENT - PLEASE REVIEW IMPORTANT INFORMATION BELOW SEVERAL DAYS PRIOR TO YOUR APPOINTMENT  Due to the recent COVID-19 pandemic, we are transitioning in-person office visits to tele-medicine visits in an effort to decrease unnecessary exposure to our patients, their families, and staff. These visits are billed to your insurance just like a normal visit is. We also encourage you to sign up for MyChart if you have not already done so. You will need a smartphone if possible. For patients that do not have this, we can still complete the visit using a regular telephone but do prefer a smartphone to enable video when possible. You may have a family member that lives with you that can help. If possible, we also ask that you have a blood pressure cuff and scale at home to measure your blood pressure, heart rate and weight prior to your scheduled appointment. Patients with clinical needs that need an in-person evaluation and testing will still be able to come to the office if absolutely necessary. If you have any questions, feel free to call our office.    YOUR PROVIDER WILL BE USING THE FOLLOWING PLATFORM TO COMPLETE YOUR VISIT: pt does have a smart phone.    IF USING MYCHART - How to Download the MyChart App to Your SmartPhone   - If Apple, go to [employer] and type in MyChart in the search bar and download the app. If Android, ask patient to go to [employer] and type in [place] in the search bar and download the app. The app is free but as with any other app downloads, your phone may require you to verify saved payment information or Apple/Android password.  - You will need to then log into the app with your MyChart username and password, and select [facility] as your healthcare provider to link the account.  - When it is time for your visit, go to the MyChart app, find  appointments, and click Begin Video Visit. Be sure to Select Allow for your device to access the Microphone and Camera for your visit. You will then be connected, and your provider will be with you shortly.  **If you have any issues connecting or need assistance, please contact MyChart service desk [phone])**  **If using a computer, in order to ensure the best quality for your visit, you will need to use either of the following Internet Browsers: Insurance underwriter or [employer] Edge**   IF USING DOXIMITY or [email or web address] - The staff will give you instructions on receiving your link to join the meeting the day of your visit.      2-3 DAYS BEFORE YOUR APPOINTMENT  You will receive a telephone call from one of our [facility] team members - your caller ID may say "Unknown caller." If this is a video visit, we will walk you through how to get the video launched on your phone. We will remind you check your blood pressure, heart rate and weight prior to your scheduled appointment. If you have an Apple Watch or Kardia, please upload any pertinent ECG strips the day before or morning of your appointment to [facility]. Our staff will also make sure you have reviewed the consent and agree to move forward with your scheduled tele-health visit.     THE DAY OF YOUR APPOINTMENT  Approximately 15 minutes prior to your scheduled appointment, you will receive  a telephone call from one of [facility] team - your caller ID may say "Unknown caller."  Our staff will confirm medications, vital signs for the day and any symptoms you may be experiencing. Please have this information available prior to the time of visit start. It may also be helpful for you to have a pad of paper and pen handy for any instructions given during your visit. They will also walk you through joining the smartphone meeting if this is a video visit.    CONSENT FOR TELE-HEALTH VISIT - PLEASE REVIEW  I hereby voluntarily request, consent  and authorize [facility] and its employed or contracted physicians, physician assistants, nurse practitioners or other licensed health care professionals (the Practitioner), to provide me with telemedicine health care services (the Services") as deemed necessary by the treating Practitioner. I acknowledge and consent to receive the Services by the Practitioner via telemedicine. I understand that the telemedicine visit will involve communicating with the Practitioner through live audiovisual communication technology and the disclosure of certain medical information by electronic transmission. I acknowledge that I have been given the opportunity to request an in-person assessment or other available alternative prior to the telemedicine visit and am voluntarily participating in the telemedicine visit.  I understand that I have the right to withhold or withdraw my consent to the use of telemedicine in the course of my care at any time, without affecting my right to future care or treatment, and that the Practitioner or I may terminate the telemedicine visit at any time. I understand that I have the right to inspect all information obtained and/or recorded in the course of the telemedicine visit and may receive copies of available information for a reasonable fee.  I understand that some of the potential risks of receiving the Services via telemedicine include:   Delay or interruption in medical evaluation due to technological equipment failure or disruption;  Information transmitted may not be sufficient (e.g. poor resolution of images) to allow for appropriate medical decision making by the Practitioner; and/or   In rare instances, security protocols could fail, causing a breach of personal health information.  Furthermore, I acknowledge that it is my responsibility to provide information about my medical history, conditions and care that is complete and accurate to the best of my ability. I acknowledge  that Practitioner's advice, recommendations, and/or decision may be based on factors not within their control, such as incomplete or inaccurate data provided by me or distortions of diagnostic images or specimens that may result from electronic transmissions. I understand that the practice of medicine is not an exact science and that Practitioner makes no warranties or guarantees regarding treatment outcomes. I acknowledge that I will receive a copy of this consent concurrently upon execution via email to the email address I last provided but may also request a printed copy by calling the office of [facility].    I understand that my insurance will be billed for this visit.   I have read or had this consent read to me.  I understand the contents of this consent, which adequately explains the benefits and risks of the Services being provided via telemedicine.   I have been provided ample opportunity to ask questions regarding this consent and the Services and have had my questions answered to my satisfaction.  I give my informed consent for the services to be provided through the use of telemedicine in my medical care  By participating in this telemedicine visit I agree to  the above.

## 2018-11-10 NOTE — Plan of Care (Signed)

## 2018-11-10 NOTE — Discharge Summary (Signed)
Physician Discharge Summary  Rodney Lane WSF:[id number] DOB: April 22, 1971 DOA: 11/06/2018  PCP: System, Pcp Not In  Admit date: 11/06/2018 Discharge date: 11/10/2018 Consultations: Cardiology  Admitted From: Home Disposition: home  Discharge Diagnoses:  Principal Problem:   Chest pain, rule out acute myocardial infarction Active Problems:   Renal insufficiency   HTN (hypertension)   Sinus tachycardia   Brief/Interim Summary: 48 y.o.malewith medical history significant ofHTN, obesity,tachycardia being treated with cardizem by PCP.Patient presents to the ED with c/o SOB and CP.HR in 150s, received adenosine with no change in rhythm and implied sinus tachycardia.  His heart rate did improve and was in the 110s at the time of admission to the medical floor. Patient reports several weeks of increasingexertional dyspnea relieved by rest. Unable to walk from mailbox to home without taking a break.  He also reports lightheadedness while walking.  He reported negative stress test many years back.EKG NSR and trop x2 -ve. Lab work revealed creatinine of 1.5 with no prior baseline to compare.  Patient had persistent chest tightness at the time of admission and Nitropatch added. Patient admitted to hospitalist service with cardiology consultation.  Patient had several vague complaints since admission including headache and episode of blurred vision prompting code stroke and evaluation by neurology but with unremarkable clinical and imaging results.  He appeared to be very anxious regarding his medical condition.  He also complained of sore throat and had a low-grade fever of 100.8 during the hospital course.  His coronavirus testing was negative on presentation. Below is the discussion per problem list in detail.  1.  Chest pain/dyspnea: Now resolved. No significant EKG changes.  Troponin x3-.  Off Nitropatch given complaints of headache/blurred vision.  Echo shows EF of 50 to 55%, mildly impaired LV  relaxation.  Seen by cardiology during the hospital course.Patient refused stress test.given his complaints of headache, Nitropatch was discontinued.  CT chest negative for PE and no evidence of pneumonia.COVID testing negative on presentation.  2. Sinus tachycardia: Likely secondary to dehydration/anxiety/now fever.  Patient seen by cardiology who initially felt he may have atrial arrhythmia. Dr. Johnsie Cancel attempted vagal maneuvers and CSM without changes to rate/rhythm.  He was subsequently evaluated by electrophysiology who felt patient likely has sinus tachycardia as a physiological response to dehydration. TSH within normal limits.  Cardiology recommended higher dose diltiazem upon discharge.    3.  Acute kidney injury: In the setting of dehydration/diuretics.  Patient's creatinine on presentation was 1.5 which is improved to 1.26 now with IV fluids.  He was on hydrochlorothiazide as outpatient which has been held.  This could be his new normal creatinine.  Advised to follow-up with PCP for further monitoring.  4.?  Orthostatic hypotension:  Patient did have some orthostatic change on blood pressure checks in the ED.  Repeat checks showed systolic 017C with no orthostatic change.  Patient was on Norvasc as outpatient which is now discontinued as he is also on calcium channel blocker-diltiazem push dose has been adjusted to 240 mg.  His systolic blood pressure fluctuated between 110 to 140 during the hospital course.   He is advised to follow-up as outpatient for further antihypertensive dose adjustment.  5.  Sore throat/low-grade fever: Cultures for strep throat sent (results still pending) and started on empiric antibiotics.he reports improvement of throat pain with antibiotics and Magic mouthwash.  He has been afebrile over the last 24 hours and cleared for discharge. Mild elevation in inflammatory markers (C-reactive protein and ESR) might be secondary  to this.  Repeat labs showed resolution of mild  leukocytosis.  6.  Blurred vision: Now resolved.  Code stroke summoned 6/12 due to his complaints of intermittent blurred vision.  CT head/MRI MRA of the head were negative. Appreciate neurology evaluation who have cleared him from their standpoint.  7.  Anxiety: Not sure if patient's complaints of #1 and #2 related to anxiety.  Patient has been noted to be anxious since presentation with subjective complaints much dramatic than objective findings. Started on hydroxyzine.   Discharge Exam: Vitals:   11/10/18 0427 11/10/18 0840  BP: (!) 141/89 (!) 146/102  Pulse: (!) 108 (!) 107  Resp: (!) 22   Temp: 98.3 F (36.8 C) 98.1 F (36.7 C)  SpO2: 100% 97%   Vitals:   11/09/18 2200 11/10/18 0022 11/10/18 0427 11/10/18 0840  BP: 131/85 (!) 138/99 (!) 141/89 (!) 146/102  Pulse:  (!) 101 (!) 108 (!) 107  Resp: (!) 22  (!) 22   Temp: 98.1 F (36.7 C) 98 F (36.7 C) 98.3 F (36.8 C) 98.1 F (36.7 C)  TempSrc: Oral Oral Oral Oral  SpO2: 98% 95% 100% 97%  Weight:   (!) 136.5 kg   Height:        General: Pt is alert, awake, not in acute distress Cardiovascular: RRR, S1/S2 +, no rubs, no gallops Respiratory: CTA bilaterally, no wheezing, no rhonchi Abdominal: Soft, NT, ND, bowel sounds + Extremities: no edema, no cyanosis  Discharge Instructions  Discharge Instructions    Amb referral to [facility]   Complete by: As directed    Call MD for:  difficulty breathing, headache or visual disturbances   Complete by: As directed    Call MD for:  extreme fatigue   Complete by: As directed    Call MD for:  persistant dizziness or light-headedness   Complete by: As directed    Call MD for:  temperature >100.4   Complete by: As directed    Diet - low sodium heart healthy   Complete by: As directed    Increase activity slowly   Complete by: As directed      Allergies as of 11/10/2018   No Known Allergies     Medication List    STOP taking these medications   amLODipine 5 MG  tablet Commonly known as: NORVASC   Cardizem LA 120 MG 24 hr tablet Generic drug: diltiazem Replaced by: diltiazem 240 MG 24 hr capsule   hydrochlorothiazide 25 MG tablet Commonly known as: HYDRODIURIL     TAKE these medications   albuterol 108 (90 Base) MCG/ACT inhaler Commonly known as: VENTOLIN HFA Inhale 1-2 puffs into the lungs every 6 (six) hours as needed for wheezing or shortness of breath.   cefdinir 300 MG capsule Commonly known as: OMNICEF Take 1 capsule (300 mg total) by mouth every 12 (twelve) hours for 6 days.   cetirizine 10 MG tablet Commonly known as: ZYRTEC Take 10 mg by mouth daily.   diltiazem 240 MG 24 hr capsule Commonly known as: CARDIZEM CD Take 1 capsule (240 mg total) by mouth daily for 30 days. Replaces: Cardizem LA 120 MG 24 hr tablet   hydrOXYzine 10 MG tablet Commonly known as: ATARAX/VISTARIL Take 1 tablet (10 mg total) by mouth 2 (two) times daily as needed for itching or anxiety.   magic mouthwash Soln Take 5 mLs by mouth 3 (three) times daily as needed for mouth pain.      Follow-up Information  Josue Hector, MD Follow up.   Specialty: Cardiology Why: the office will call with date and time of visit, virtual visit. Contact information: [address] [address] [phone]          No Known Allergies     Discharge Condition: stable CODE STATUS: Full code Diet recommendation:2 gram sodium diet Recommendations for Outpatient Follow-up:  1. Follow up with PCP in 1 week 2. Please obtain BMP/CBC in one week 3. Please follow up on the following pending results: Strep throat cx      The results of significant diagnostics from this hospitalization (including imaging, microbiology, ancillary and laboratory) are listed below for reference.     Microbiology: Recent Results (from the past 240 hour(s))  SARS Coronavirus 2     Status: None   Collection Time: 11/06/18 11:39 PM  Result Value  Ref Range Status   SARS Coronavirus 2 NOT DETECTED NOT DETECTED Final    Comment: (NOTE) SARS-CoV-2 target nucleic acids are NOT DETECTED. The SARS-CoV-2 RNA is generally detectable in upper and lower respiratory specimens during the acute phase of infection.  Negative  results do not preclude SARS-CoV-2 infection, do not rule out co-infections with other pathogens, and should not be used as the sole basis for treatment or other patient management decisions.  Negative results must be combined with clinical observations, patient history, and epidemiological information. The expected result is Not Detected. Fact Sheet for Patients: http://www.biofiredefense.com/wp-content/uploads/2020/03/BIOFIRE-COVID -19-patients.pdf Fact Sheet for Healthcare Providers: http://www.biofiredefense.com/wp-content/uploads/2020/03/BIOFIRE-COVID -19-hcp.pdf This test is not yet approved or cleared by the [place] and  has been authorized for detection and/or diagnosis of SARS-CoV-2 by FDA under an Emergency Use Authorization (EUA).  This EUA will remain in effec t (meaning this test can be used) for the duration of  the COVID-19 declaration under Section 564(b)(1) of the Act, 21 U.S.C. section 360bbb-3(b)(1), unless the authorization is terminated or revoked sooner. Performed at [facility], [place] [address] [place], [place] [address]   Urine culture     Status: None   Collection Time: 11/07/18 12:25 AM   Specimen: Urine, Random  Result Value Ref Range Status   Specimen Description URINE, RANDOM  Final   Special Requests NONE  Final   Culture   Final    NO GROWTH Performed at [facility], [place] [address], [place], [place] [address]    Report Status 11/08/2018 FINAL  Final  Culture, group A strep     Status: None (Preliminary result)   Collection Time: 11/08/18 12:03 PM   Specimen: Throat  Result Value Ref Range Status   Specimen Description THROAT  Final   Special Requests  NONE  Final   Culture   Final    TOO YOUNG TO READ Performed at [facility], [address], [place], [place] [address]    Report Status PENDING  Incomplete     Labs: BNP (last 3 results) No results for input(s): BNP in the last 8760 hours. Basic Metabolic Panel: Recent Labs  Lab 11/06/18 2249 11/07/18 0622 11/08/18 0527 11/09/18 1117  NA 136  --  138 136  K 4.1  --  3.7 3.6  CL 100  --  99 102  CO2 20*  --  28 25  GLUCOSE 121*  --  126* 109*  BUN 21*  --  15 12  CREATININE 1.51* 1.50* 1.28* 1.26*  CALCIUM 9.7  --  9.1 8.7*  MG 1.9  --   --   --  Liver Function Tests: No results for input(s): AST, ALT, ALKPHOS, BILITOT, PROT, ALBUMIN in the last 168 hours. No results for input(s): LIPASE, AMYLASE in the last 168 hours. No results for input(s): AMMONIA in the last 168 hours. CBC: Recent Labs  Lab 11/06/18 2249 11/09/18 1152  WBC 10.6* 6.8  HGB 14.5 12.6*  HCT 42.9 38.1*  MCV 89.7 91.1  PLT 172 161   Cardiac Enzymes: Recent Labs  Lab 11/06/18 2249 11/07/18 0523 11/07/18 1044 11/07/18 1609  TROPONINI <0.03 <0.03 <0.03 <0.03   BNP: Invalid input(s): POCBNP CBG: Recent Labs  Lab 11/08/18 1828  GLUCAP 175*   D-Dimer No results for input(s): DDIMER in the last 72 hours. Hgb A1c Recent Labs    11/08/18 0527  HGBA1C 6.8*   Lipid Profile Recent Labs    11/08/18 0527  CHOL 200  HDL 29*  LDLCALC UNABLE TO CALCULATE IF TRIGLYCERIDE OVER 400 mg/dL  TRIG 519*  CHOLHDL 6.9  LDLDIRECT 78.4   Thyroid function studies No results for input(s): TSH, T4TOTAL, T3FREE, THYROIDAB in the last 72 hours.  Invalid input(s): FREET3 Anemia work up No results for input(s): VITAMINB12, FOLATE, FERRITIN, TIBC, IRON, RETICCTPCT in the last 72 hours. Urinalysis No results found for: COLORURINE, APPEARANCEUR, [place], [place], [place], [place], [place], [place], PROTEINUR, UROBILINOGEN, NITRITE, LEUKOCYTESUR Sepsis Labs Invalid input(s): PROCALCITONIN,   WBC,  LACTICIDVEN Microbiology Recent Results (from the past 240 hour(s))  SARS Coronavirus 2     Status: None   Collection Time: 11/06/18 11:39 PM  Result Value Ref Range Status   SARS Coronavirus 2 NOT DETECTED NOT DETECTED Final    Comment: (NOTE) SARS-CoV-2 target nucleic acids are NOT DETECTED. The SARS-CoV-2 RNA is generally detectable in upper and lower respiratory specimens during the acute phase of infection.  Negative  results do not preclude SARS-CoV-2 infection, do not rule out co-infections with other pathogens, and should not be used as the sole basis for treatment or other patient management decisions.  Negative results must be combined with clinical observations, patient history, and epidemiological information. The expected result is Not Detected. Fact Sheet for Patients: http://www.biofiredefense.com/wp-content/uploads/2020/03/BIOFIRE-COVID -19-patients.pdf Fact Sheet for Healthcare Providers: http://www.biofiredefense.com/wp-content/uploads/2020/03/BIOFIRE-COVID -19-hcp.pdf This test is not yet approved or cleared by the [place] and  has been authorized for detection and/or diagnosis of SARS-CoV-2 by FDA under an Emergency Use Authorization (EUA).  This EUA will remain in effec t (meaning this test can be used) for the duration of  the COVID-19 declaration under Section 564(b)(1) of the Act, 21 U.S.C. section 360bbb-3(b)(1), unless the authorization is terminated or revoked sooner. Performed at [facility], [place] [address], [place], [place] [address]   Urine culture     Status: None   Collection Time: 11/07/18 12:25 AM   Specimen: Urine, Random  Result Value Ref Range Status   Specimen Description URINE, RANDOM  Final   Special Requests NONE  Final   Culture   Final    NO GROWTH Performed at [facility], [place] [address] [place], [place] [address]    Report Status 11/08/2018 FINAL  Final  Culture, group A strep     Status: None  (Preliminary result)   Collection Time: 11/08/18 12:03 PM   Specimen: Throat  Result Value Ref Range Status   Specimen Description THROAT  Final   Special Requests NONE  Final   Culture   Final    TOO YOUNG TO READ Performed at [facility], [address],  [place], [place] [address]    Report Status PENDING  Incomplete    Procedures/Studies: Dg Chest 1 View  Result Date: 11/06/2018 CLINICAL DATA:  Chest pain EXAM: CHEST  1 VIEW COMPARISON:  None. FINDINGS: The heart size and mediastinal contours are within normal limits. Both lungs are clear. The visualized skeletal structures are unremarkable. IMPRESSION: No active disease. Electronically Signed   By: Donavan Foil M.D.   On: 11/06/2018 22:56   Ct Angio Chest Pe W And/or Wo Contrast  Result Date: 11/07/2018 CLINICAL DATA:  48 year old male with chest pain shortness of breath and diaphoresis. EXAM: CT ANGIOGRAPHY CHEST WITH CONTRAST TECHNIQUE: Multidetector CT imaging of the chest was performed using the standard protocol during bolus administration of intravenous contrast. Multiplanar CT image reconstructions and MIPs were obtained to evaluate the vascular anatomy. CONTRAST:  70 milliliters OMNIPAQUE IOHEXOL 350 MG/ML SOLN COMPARISON:  Portable chest 11/06/2018. FINDINGS: Cardiovascular: Excellent contrast bolus timing in the pulmonary arterial tree. Mild streak artifact in the chest. No focal filling defect identified in the pulmonary arteries to suggest acute pulmonary embolism. Evidence of calcified coronary artery atherosclerosis on series 7, image 180. Mild cardiomegaly. No pericardial effusion. Negative visible aorta. Mediastinum/Nodes: Negative. No lymphadenopathy. Lungs/Pleura: Major airways are patent. Minimal pulmonary atelectasis. No pleural effusion or other abnormal pulmonary opacity. Upper Abdomen: Hepatic steatosis. Negative visible gallbladder, spleen, pancreas, adrenal glands, kidneys, and bowel. Musculoskeletal:  Negative. Review of the MIP images confirms the above findings. IMPRESSION: 1. Negative for acute pulmonary embolus. 2. Calcified coronary artery atherosclerosis. Mild cardiomegaly. No pericardial effusion. 3. Minimal pulmonary atelectasis. 4. Hepatic steatosis. Electronically Signed   By: Genevie Ann M.D.   On: 11/07/2018 01:05   Mr Jodene Nam Head Wo Contrast  Result Date: 11/08/2018 CLINICAL DATA:  Blurry vision EXAM: MRI HEAD WITHOUT CONTRAST MRA HEAD WITHOUT CONTRAST TECHNIQUE: Multiplanar, multiecho pulse sequences of the brain and surrounding structures were obtained without intravenous contrast. Angiographic images of the head were obtained using MRA technique without contrast. COMPARISON:  None. FINDINGS: MRI HEAD FINDINGS BRAIN: There is no acute infarct, acute hemorrhage or extra-axial collection. The midline structures are normal. No midline shift or other mass effect. The white matter signal is normal for the patient's age. The cerebral and cerebellar volume are age-appropriate. No hydrocephalus. Susceptibility-sensitive sequences show no chronic microhemorrhage or superficial siderosis. No mass lesion. VASCULAR: The major intracranial arterial and venous sinus flow voids are normal. SKULL AND UPPER CERVICAL SPINE: Calvarial bone marrow signal is normal. There is no skull base mass. Visualized upper cervical spine and soft tissues are normal. SINUSES/ORBITS: Polypoid mucosal thickening of both maxillary sinuses. No mastoid or middle ear effusion. The orbits are normal. MRA HEAD FINDINGS POSTERIOR CIRCULATION: --Vertebral arteries: Normal V4 segments. --Posterior inferior cerebellar arteries (PICA): Patent origins from the vertebral arteries. --Anterior inferior cerebellar arteries (AICA): Patent origins from the basilar artery. --Basilar artery: Normal. --Superior cerebellar arteries: Normal. --Posterior cerebral arteries (PCA): Normal. Both originate from the basilar artery. Posterior communicating arteries  (p-comm) are diminutive or absent. ANTERIOR CIRCULATION: --Intracranial internal carotid arteries: Normal. --Anterior cerebral arteries (ACA): Normal. Both A1 segments are present. Patent anterior communicating artery (a-comm). --Middle cerebral arteries (MCA): Normal. IMPRESSION: Normal MRI/MRA of the brain. Electronically Signed   By: Ulyses Jarred M.D.   On: 11/08/2018 20:47   Mr Brain Wo Contrast  Result Date: 11/08/2018 CLINICAL DATA:  Blurry vision EXAM: MRI HEAD WITHOUT CONTRAST MRA HEAD WITHOUT CONTRAST TECHNIQUE: Multiplanar, multiecho pulse sequences of the brain and surrounding structures were  obtained without intravenous contrast. Angiographic images of the head were obtained using MRA technique without contrast. COMPARISON:  None. FINDINGS: MRI HEAD FINDINGS BRAIN: There is no acute infarct, acute hemorrhage or extra-axial collection. The midline structures are normal. No midline shift or other mass effect. The white matter signal is normal for the patient's age. The cerebral and cerebellar volume are age-appropriate. No hydrocephalus. Susceptibility-sensitive sequences show no chronic microhemorrhage or superficial siderosis. No mass lesion. VASCULAR: The major intracranial arterial and venous sinus flow voids are normal. SKULL AND UPPER CERVICAL SPINE: Calvarial bone marrow signal is normal. There is no skull base mass. Visualized upper cervical spine and soft tissues are normal. SINUSES/ORBITS: Polypoid mucosal thickening of both maxillary sinuses. No mastoid or middle ear effusion. The orbits are normal. MRA HEAD FINDINGS POSTERIOR CIRCULATION: --Vertebral arteries: Normal V4 segments. --Posterior inferior cerebellar arteries (PICA): Patent origins from the vertebral arteries. --Anterior inferior cerebellar arteries (AICA): Patent origins from the basilar artery. --Basilar artery: Normal. --Superior cerebellar arteries: Normal. --Posterior cerebral arteries (PCA): Normal. Both originate from the  basilar artery. Posterior communicating arteries (p-comm) are diminutive or absent. ANTERIOR CIRCULATION: --Intracranial internal carotid arteries: Normal. --Anterior cerebral arteries (ACA): Normal. Both A1 segments are present. Patent anterior communicating artery (a-comm). --Middle cerebral arteries (MCA): Normal. IMPRESSION: Normal MRI/MRA of the brain. Electronically Signed   By: Ulyses Jarred M.D.   On: 11/08/2018 20:47    (Echo, Carotid, EGD, Colonoscopy, ERCP)  Time coordinating discharge: Over 30 minutes  SIGNED:   Guilford Shi, MD  [facility] 11/10/2018, 9:53 AM Pager   If 7PM-7AM, please contact night-coverage [email or web address] Password [email or web address]

## 2018-11-11 LAB — CULTURE, GROUP A STREP (THRC)

## 2018-11-20 ENCOUNTER — Telehealth: Payer: Self-pay | Admitting: *Deleted

## 2018-11-20 NOTE — Telephone Encounter (Signed)
Call placed to re: appt 11/25/2018.   Need to change to in-office and prescreen for covid 19. Left a message for pt to call back.

## 2018-11-21 NOTE — Telephone Encounter (Signed)
Follow up      [place]  1. Do you currently have a fever?  a. NO   2. Have you recently traveled on a cruise, internationally, or to [place], [place], [place], [place], [place], or [place], [place] [employer])?  a. NO  3. Have you been in contact with someone that is currently pending confirmation of Covid19 testing or has been confirmed to have the [place] virus?  a. NO  4. Are you currently experiencing fatigue or cough?  a. Fatigue, pt is currently experiencing elevated BP as well

## 2018-11-22 ENCOUNTER — Telehealth: Payer: Self-pay

## 2018-11-22 NOTE — Telephone Encounter (Signed)
Left patient a message in regards to pre-screening for CoVid prior to his 6/29 appointment with Cecilie Kicks.

## 2018-11-22 NOTE — Telephone Encounter (Signed)
Left message for pt to call back about screening and to advise of new protocol in office.

## 2018-11-24 NOTE — Progress Notes (Signed)
Cardiology Office Note   Date:  11/25/2018   ID:  Rodney Lane, DOB [birth year], MRN [id number]  PCP:  System, Pcp Not In Was Dr. Terance Hart in [place] Cardiologist:  Dr. Johnsie Cancel     Chief Complaint  Patient presents with  . Hospitalization Follow-up      History of Present Illness: Rodney Lane is a 48 y.o. male who presents for post hospitalization.   He has hx of HTN, morbid obesity (BMI is 44)  o SOB since mid-May, initially felt to have bronchitis, no reported CP, palpitations, no cough, fever or symptoms of illness, neg COVID test, CXR, there is mention of tachycardia, and seems perhaps his amlodipine changed to diltiazem 120mg  and added HCTZ 25.    Admitted to [facility] 11/07/2018 with acute onset of worsened SOB, CP, palpitations.  In the ER noted to be in ST 150's treated initially with adenosine that had no effect, then IVF, ASA and NTG, rates improved and symptoms improved as well.  CT chest negative for PE, his COVD test here negative, no obvious signs of infection, afebrile, WBC wnl. He also had a TSH that was wnl. CRP and sed rate elevated.  Hgb A1c 6.8.    Echo with LVEF 50-55%, no WMA described, +impaired relaxation, no pericardial effusion.  Pt declined stress test secondary to bad past experience, body habitus and tachycardia precludes coronary CT, did not feel invasive/cath warranted.  Dr. Johnsie Cancel ultimatly did not feel ischemic evaluation was needed acutely with thoughts towards ETT or CT when COVID restrictions allow  He was seen by Dr. Curt Bears and placed on dilt 240 mg daily.  Hydrate.   Suspected ST alone.  BB stopped due to weakness and fatigue.    Today still feels bad.  With any exertion chest pain and SOB.  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  Only time he feels well if in recliner or flat in bed. occ wheeze but not freq.  The chest pain is same as hospital with tightness and squeezing pain.  His BP is poorly controlled.   He is on both amlodipine and dilt.  Last  labs with Cr 1.26 and K+ 3.6 Cr was 1.51 on admit.  He is borderline diabetic with hgb A1c of 6.8 instructed on diet.     Past Medical History:  Diagnosis Date  . HTN (hypertension)   . Tachycardia    Not sure why it goes fast, but someone put him on cardizem.  Appears to be S.tach    History reviewed. No pertinent surgical history.   Current Outpatient Medications  Medication Sig Dispense Refill  . albuterol (VENTOLIN HFA) 108 (90 Base) MCG/ACT inhaler Inhale 1-2 puffs into the lungs every 6 (six) hours as needed for wheezing or shortness of breath.    Marland Kitchen amLODipine (NORVASC) 5 MG tablet Take 5 mg by mouth daily.    . cetirizine (ZYRTEC) 10 MG tablet Take 10 mg by mouth daily.    . hydrOXYzine (ATARAX/VISTARIL) 10 MG tablet Take 1 tablet (10 mg total) by mouth 2 (two) times daily as needed for itching or anxiety. 30 tablet 0  . diltiazem (CARDIZEM CD) 360 MG 24 hr capsule Take 1 capsule (360 mg total) by mouth daily. 90 capsule 3  . magic mouthwash SOLN Take 5 mLs by mouth 3 (three) times daily as needed for mouth pain. (Patient not taking: Reported on 11/25/2018) 100 mL 1   No current facility-administered medications for this visit.  Allergies:   Patient has no known allergies.    Social History:  The patient  reports that he has never smoked. He has never used smokeless tobacco. He reports that he does not drink alcohol or use drugs.   Family History:  The patient's family history includes Cancer in his father and mother; Diabetes in his father; Heart disease in his maternal grandfather and paternal grandfather.    ROS:  General:no colds or fevers, some weight loss but not the 30 lbs noted. unsure if [facility] wts are correct.  Skin:no rashes or ulcers HEENT:some blurred vision, no congestion CV:see HPI PUL:see HPI GI:no diarrhea constipation or melena, no indigestion GU:no hematuria, no dysuria MS:no joint pain, no claudication Neuro:no syncope, no lightheadedness Endo:+  diabetes, no thyroid disease  Wt Readings from Last 3 Encounters:  11/25/18 269 lb 1.9 oz (122.1 kg)  11/10/18 (!) 301 lb (136.5 kg)     PHYSICAL EXAM: VS:  BP (!) 158/98   Pulse (!) 113   Ht 5\' 9"  (1.753 m)   Wt 269 lb 1.9 oz (122.1 kg)   SpO2 98%   BMI 39.74 kg/m  , BMI Body mass index is 39.74 kg/m. General:Pleasant affect, NAD Skin:Warm and dry, brisk capillary refill HEENT:normocephalic, sclera clear, mucus membranes moist Neck:supple, no JVD, no bruits  Heart:S1S2 RRR rapid, without murmur, gallup, rub or click Lungs:clear without rales, rhonchi, or wheezes WIO:MBTD, non tender, + BS, do not palpate liver spleen or masses Ext:no lower ext edema, 2+ pedal pulses, 2+ radial pulses Neuro:alert and oriented X 3, MAE, follows commands, + facial symmetry    EKG:  EKG is ordered today. The ekg ordered today demonstrates ST at 110 no ST changes and similar to EKG 11/08/18   Recent Labs: 11/06/2018: Magnesium 1.9; TSH 2.400 11/09/2018: BUN 12; Creatinine, Ser 1.26; Hemoglobin 12.6; Platelets 161; Potassium 3.6; Sodium 136    Lipid Panel    Component Value Date/Time   CHOL 200 11/08/2018 0527   TRIG 519 (H) 11/08/2018 0527   HDL 29 (L) 11/08/2018 0527   CHOLHDL 6.9 11/08/2018 0527   VLDL UNABLE TO CALCULATE IF TRIGLYCERIDE OVER 400 mg/dL 11/08/2018 0527   LDLCALC UNABLE TO CALCULATE IF TRIGLYCERIDE OVER 400 mg/dL 11/08/2018 0527   LDLDIRECT 78.4 11/08/2018 0527       Other studies Reviewed: Additional studies/ records that were reviewed today include:   Echo 11/07/18. IMPRESSIONS    1. The left ventricle has low normal systolic function, with an ejection fraction of 50-55%. The cavity size was normal. There is mildly increased left ventricular wall thickness. Left ventricular diastolic Doppler parameters are consistent with  impaired relaxation.  2. The right ventricle has normal systolic function. The cavity was normal. There is no increase in right ventricular  wall thickness.  3. The aortic valve was not well visualized.  FINDINGS  Left Ventricle: The left ventricle has low normal systolic function, with an ejection fraction of 50-55%. The cavity size was normal. There is mildly increased left ventricular wall thickness. Left ventricular diastolic Doppler parameters are consistent  with impaired relaxation.  Right Ventricle: The right ventricle has normal systolic function. The cavity was normal. There is no increase in right ventricular wall thickness.  Left Atrium: Left atrial size was normal in size.  Right Atrium: Right atrial size was normal in size. Right atrial pressure is estimated at 10 mmHg.  Interatrial Septum: No atrial level shunt detected by color flow Doppler.  Pericardium: There is no evidence of  pericardial effusion.  Mitral Valve: The mitral valve was not well visualized. Mitral valve regurgitation is not visualized by color flow Doppler.  Tricuspid Valve: The tricuspid valve is normal in structure. Tricuspid valve regurgitation was not visualized by color flow Doppler.  Aortic Valve: The aortic valve was not well visualized Aortic valve regurgitation was not visualized by color flow Doppler. There is no evidence of aortic valve stenosis.  Pulmonic Valve: The pulmonic valve was normal in structure. Pulmonic valve regurgitation is not visualized by color flow Doppler.  Venous: The inferior vena cava is normal in size with greater than 50% respiratory variability.   ASSESSMENT AND PLAN:  1.  Chest pain and DOE along with tachycardia.  Neg troponin in hospital HR improved on dilt but BP and HR are elevated.  He did have coronary calcifications on CTA of chest which was neg for PE.   Reviewed with Dr. Irish Lack, will do lexiscan myoview.  If + would do cath.  Unable to do ETT due to tachycardia and same for cardiac CTA -  Unable to work in [place] on fork lift with these symptoms so will put out of work for 2 weeks  for eval.  If stress is neg will review with Dr. Johnsie Cancel and possibly refer to pulmonary.     2.  HTN still elevated will stop amlodpine and add imdur 30 mg - in hospital it may have helped some and increase dilt to 360 mg.    3.  CKD 2 with Cr of 1.26  Down from 1.50 on admit.    3.  DM-2 new needs PCP, have discussed diet.      Current medicines are reviewed with the patient today.  The patient Has no concerns regarding medicines.  The following changes have been made:  See above Labs/ tests ordered today include:see above  Disposition:   FU:  see above  Signed, Cecilie Kicks, NP  11/25/2018 9:50 AM    [facility], [place], [place], [place] Phone: [phone]; Fax: [phone]

## 2018-11-25 ENCOUNTER — Other Ambulatory Visit: Payer: Self-pay

## 2018-11-25 ENCOUNTER — Encounter: Payer: Self-pay | Admitting: Cardiology

## 2018-11-25 ENCOUNTER — Ambulatory Visit (INDEPENDENT_AMBULATORY_CARE_PROVIDER_SITE_OTHER): Payer: Self-pay | Admitting: Cardiology

## 2018-11-25 ENCOUNTER — Telehealth (HOSPITAL_COMMUNITY): Payer: Self-pay

## 2018-11-25 VITALS — BP 158/98 | HR 113 | Ht 69.0 in | Wt 269.1 lb

## 2018-11-25 DIAGNOSIS — R0609 Other forms of dyspnea: Secondary | ICD-10-CM

## 2018-11-25 DIAGNOSIS — R079 Chest pain, unspecified: Secondary | ICD-10-CM

## 2018-11-25 DIAGNOSIS — R000 Tachycardia, unspecified: Secondary | ICD-10-CM

## 2018-11-25 DIAGNOSIS — E119 Type 2 diabetes mellitus without complications: Secondary | ICD-10-CM

## 2018-11-25 DIAGNOSIS — R0789 Other chest pain: Secondary | ICD-10-CM

## 2018-11-25 DIAGNOSIS — I10 Essential (primary) hypertension: Secondary | ICD-10-CM

## 2018-11-25 MED ORDER — DILTIAZEM HCL ER COATED BEADS 360 MG PO CP24: 360.0000 mg | ORAL_CAPSULE | Freq: Every day | ORAL | 3 refills | Status: DC

## 2018-11-25 MED ORDER — ISOSORBIDE MONONITRATE ER 30 MG PO TB24: 30.0000 mg | ORAL_TABLET | Freq: Every day | ORAL | 3 refills | Status: DC

## 2018-11-25 NOTE — Telephone Encounter (Signed)
Spoke with the patient and gave him his instructions for his test. He stated that he would be here and asked to call back with any questions. S.Florance Paolillo EMTP

## 2018-11-25 NOTE — Patient Instructions (Addendum)
Medication Instructions:  STOP Amlodipine START Imdur 30 mg Daily INCREASE Cardiazem to 360 mg Daily  If you need a refill on your cardiac medications before your next appointment, please call your pharmacy.   Lab work: none If you have labs (blood work) drawn today and your tests are completely normal, you will receive your results only by: Marland Kitchen MyChart Message (if you have MyChart) OR . A paper copy in the mail If you have any lab test that is abnormal or we need to change your treatment, we will call you to review the results.  Testing/Procedures: This WEEK Please Your physician has requested that you have a lexiscan myoview. For further information please visit [email or web address]. Please follow instruction sheet, as given.    Follow-Up: 2 WEEKS Dr Johnsie Cancel or [place], you and your health needs are our priority.  As part of our continuing mission to provide you with exceptional heart care, we have created designated Provider Care Teams.  These Care Teams include your primary Cardiologist (physician) and Advanced Practice Providers (APPs -  Physician Assistants and Nurse Practitioners) who all work together to provide you with the care you need, when you need it. .   Any Other Special Instructions Will Be Listed Below (If Applicable).  Primary Care Physician [phone]

## 2018-11-27 ENCOUNTER — Other Ambulatory Visit: Payer: Self-pay

## 2018-11-27 ENCOUNTER — Ambulatory Visit (HOSPITAL_COMMUNITY): Payer: BC Managed Care – PPO | Attending: Cardiology

## 2018-11-27 DIAGNOSIS — R0789 Other chest pain: Secondary | ICD-10-CM | POA: Diagnosis not present

## 2018-11-27 DIAGNOSIS — R079 Chest pain, unspecified: Secondary | ICD-10-CM

## 2018-11-27 LAB — MYOCARDIAL PERFUSION IMAGING
LV dias vol: 106 mL (ref 62–150)
LV sys vol: 67 mL
Peak HR: 133 {beats}/min
Rest HR: 108 {beats}/min
SDS: 3
SRS: 0
SSS: 3
TID: 1.06

## 2018-11-27 MED ORDER — REGADENOSON 0.4 MG/5ML IV SOLN
0.4000 mg | Freq: Once | INTRAVENOUS | Status: AC
  Administered 2018-11-27: 0.4 mg via INTRAVENOUS

## 2018-11-27 MED ORDER — TECHNETIUM TC 99M TETROFOSMIN IV KIT
32.8000 | PACK | Freq: Once | INTRAVENOUS | Status: AC | PRN
  Administered 2018-11-27: 32.8 via INTRAVENOUS
  Filled 2018-11-27: qty 33

## 2018-11-27 MED ORDER — TECHNETIUM TC 99M TETROFOSMIN IV KIT
10.1000 | PACK | Freq: Once | INTRAVENOUS | Status: AC | PRN
  Administered 2018-11-27: 10.1 via INTRAVENOUS
  Filled 2018-11-27: qty 11

## 2018-11-28 ENCOUNTER — Telehealth: Payer: Self-pay | Admitting: *Deleted

## 2018-11-28 DIAGNOSIS — R0602 Shortness of breath: Secondary | ICD-10-CM

## 2018-11-28 DIAGNOSIS — R0609 Other forms of dyspnea: Secondary | ICD-10-CM

## 2018-11-28 NOTE — Telephone Encounter (Signed)
Pt has been made aware of his stress test results and recommendations. See result note.

## 2018-11-28 NOTE — Telephone Encounter (Signed)
-----   Message from Rodney Serge, NP sent at 11/28/2018  7:59 AM EDT ----- Pt needs follow up with Dr. Curt Bears for inappropriate tach  He needs PFTs for DOE He needs pulmonary consult to eval his SOB  Let pt know Dr. Johnsie Cancel has reviewed as well no lack of blood supply to heart.   So he may feel better with higher dose of dilt.  He is out of work for 2 weeks so if we could arrange in that time frame.  Thanks.

## 2018-12-03 ENCOUNTER — Telehealth: Payer: Self-pay | Admitting: *Deleted

## 2018-12-03 NOTE — Telephone Encounter (Signed)

## 2018-12-04 ENCOUNTER — Encounter: Payer: Self-pay | Admitting: Cardiology

## 2018-12-05 ENCOUNTER — Encounter: Payer: Self-pay | Admitting: Cardiology

## 2018-12-05 ENCOUNTER — Other Ambulatory Visit: Payer: Self-pay

## 2018-12-05 ENCOUNTER — Telehealth (INDEPENDENT_AMBULATORY_CARE_PROVIDER_SITE_OTHER): Payer: BC Managed Care – PPO | Admitting: Cardiology

## 2018-12-05 DIAGNOSIS — I10 Essential (primary) hypertension: Secondary | ICD-10-CM

## 2018-12-05 MED ORDER — LOSARTAN POTASSIUM 50 MG PO TABS: 50.0000 mg | ORAL_TABLET | Freq: Every day | ORAL | 3 refills | Status: DC

## 2018-12-05 MED ORDER — VERAPAMIL HCL ER 360 MG PO CP24: 360.0000 mg | ORAL_CAPSULE | Freq: Every day | ORAL | 3 refills | Status: DC

## 2018-12-05 NOTE — Progress Notes (Signed)
Electrophysiology TeleHealth Note   Due to national recommendations of social distancing due to COVID 19, an audio/video telehealth visit is felt to be most appropriate for this patient at this time.  See Epic message for the patient's consent to telehealth for [facility].   Date:  12/05/2018   ID:  Rodney Lane, DOB 20-Sep-1970, MRN [id number]  Location: patient's home  Provider location: [address] [place]  Evaluation Performed: Follow-up visit  PCP:  System, Pcp Not In  Cardiologist:  Nishan Electrophysiologist:  Dr Curt Bears  Chief Complaint:  SVT  History of Present Illness:    Rodney Lane is a 48 y.o. male who presents via audio/video conferencing for a telehealth visit today.  Since last being seen in our clinic, the patient reports doing very well.  Today, he denies symptoms of palpitations, chest pain, shortness of breath,  lower extremity edema, dizziness, presyncope, or syncope.  The patient is otherwise without complaint today.  The patient denies symptoms of fevers, chills, cough, or new SOB worrisome for COVID 19.  He has a history significant for hypertension and morbid obesity with a BMI of 44.  He was admitted to the hospital 11/07/2018 with acute onset of shortness of breath and chest pain with palpitations.  He was found to have sinus tachycardia with rates in the 150s.  He was given adenosine without effect.  Aspirin nitroglycerin improved his symptoms.  CT of the chest was negative for PE.  Echo was performed that showed a normal ejection fraction.  Today, denies symptoms of palpitations, chest pain, orthopnea, PND, lower extremity edema, claudication, dizziness, presyncope, syncope, bleeding, or neurologic sequela. The patient is tolerating medications without difficulties.  He is continued to have the same symptoms that he had while he was in the hospital.  He does get short of breath with some palpitations.  It is unclear to me as to why he is  getting so short of breath.  His echo showed a normal ejection fraction and he is not have any evidence of coronary artery disease.  I Rodney Lane switch him from diltiazem to verapamil as he is having more headaches.  His blood pressure is also significantly elevated.  He says that he only feels better when he is laying down.  He is nervous about going back to work.  Past Medical History:  Diagnosis Date   HTN (hypertension)    Tachycardia    Not sure why it goes fast, but someone put him on cardizem.  Appears to be S.tach    No past surgical history on file.  Current Outpatient Medications  Medication Sig Dispense Refill   albuterol (VENTOLIN HFA) 108 (90 Base) MCG/ACT inhaler Inhale 1-2 puffs into the lungs every 6 (six) hours as needed for wheezing or shortness of breath.     cetirizine (ZYRTEC) 10 MG tablet Take 10 mg by mouth daily.     diltiazem (CARDIZEM CD) 360 MG 24 hr capsule Take 1 capsule (360 mg total) by mouth daily. 90 capsule 3   hydrOXYzine (ATARAX/VISTARIL) 10 MG tablet Take 1 tablet (10 mg total) by mouth 2 (two) times daily as needed for itching or anxiety. 30 tablet 0   isosorbide mononitrate (IMDUR) 30 MG 24 hr tablet Take 1 tablet (30 mg total) by mouth daily. 90 tablet 3   magic mouthwash SOLN Take 5 mLs by mouth 3 (three) times daily as needed for mouth pain. 100 mL 1   No current facility-administered medications  for this visit.     Allergies:   Patient has no known allergies.   Social History:  The patient  reports that he has never smoked. He has never used smokeless tobacco. He reports that he does not drink alcohol or use drugs.   Family History:  The patient's  family history includes Cancer in his father and mother; Diabetes in his father; Heart disease in his maternal grandfather and paternal grandfather.   ROS:  Please see the history of present illness.   All other systems are personally reviewed and negative.    Exam:    Vital Signs:  BP (!)  172/116    Pulse 88   Well appearing, alert and conversant, regular work of breathing,  good skin color Eyes- anicteric, neuro- grossly intact, skin- no apparent rash or lesions or cyanosis, mouth- oral mucosa is pink   Labs/Other Tests and Data Reviewed:    Recent Labs: 11/06/2018: Magnesium 1.9; TSH 2.400 11/09/2018: BUN 12; Creatinine, Ser 1.26; Hemoglobin 12.6; Platelets 161; Potassium 3.6; Sodium 136   Wt Readings from Last 3 Encounters:  11/27/18 269 lb (122 kg)  11/25/18 269 lb 1.9 oz (122.1 kg)  11/10/18 (!) 301 lb (136.5 kg)     Other studies personally reviewed: Additional studies/ records that were reviewed today include: ECG 11/25/2018 personally reviewed Review of the above records today demonstrates: Sinus tachycardia, rate 110   ASSESSMENT & PLAN:    1.  Tachycardia: Thought to be due to sinus tachycardia when he presented to hospital as it did improved with IV fluids.  Had no changes with adenosine.  He was put on diltiazem for rate control.  He is unfortunately continued to have issues with tachycardia with shortness of breath.  He is nervous about going back to work on Monday.  We Rodney Lane thus plan to stop his diltiazem as he thinks it is giving him headaches and started him on verapamil 360 mg long-acting daily.That being said, it does seem that he has normal heart rates at times, but continues to have symptoms.  I am not convinced that his tachycardia is contributing that much to his overall symptomatology.  This can be further worked out at his next visit with general cardiology.  2.  Hypertension: Blood pressure is significantly elevated.  We Rodney Lane start him on losartan 50 mg.  This can be titrated up at his next appointment in a week and a half.  I have told him to call the office back and speak with Rodney Lane about the possibility of being out of work for longer periods of time if these medication changes do not improve.   COVID 19 screen The patient denies symptoms  of COVID 19 at this time.  The importance of social distancing was discussed today.  Follow-up: 3 months  Current medicines are reviewed at length with the patient today.   The patient does not have concerns regarding his medicines.  The following changes were made today: Stop diltiazem, start verapamil, start losartan  Labs/ tests ordered today include:  No orders of the defined types were placed in this encounter.    Patient Risk:  after full review of this patients clinical status, I feel that they are at moderate risk at this time.  Today, I have spent 8 minutes with the patient with telehealth technology discussing shortness of breath.    Signed, Rodney Illingworth Meredith Leeds, MD  12/05/2018 9:33 AM     [facility] [address] [place] [address] [phone] (office) [phone] (fax)

## 2018-12-05 NOTE — Addendum Note (Signed)
Addended by: Dollene Primrose on: 12/05/2018 09:58 AM   Modules accepted: Orders

## 2018-12-05 NOTE — Patient Instructions (Addendum)
Called the pt and discussed the following recommendations and medication changes. He had questions regarding disability and work. He was advised to contact his HR department regarding eligibility for short term disability.   Medication Instructions:  Your physician has recommended you make the following change in your medication:   Stop Diltiazem Begin Verapamil ER 360, one capsule daily Begin Losartan 50mg  tablet, once daily  Labwork: None ordered.   Testing/Procedures: None ordered.   Follow-Up:  You have a follow up appointment scheduled with Dr. Curt Bears on October 13 at 9:45am.  Any Other Special Instructions Will Be Listed Below (If Applicable).     If you need a refill on your cardiac medications before your next appointment, please call your pharmacy.

## 2018-12-06 ENCOUNTER — Encounter: Payer: Self-pay | Admitting: *Deleted

## 2018-12-06 ENCOUNTER — Other Ambulatory Visit: Payer: Self-pay | Admitting: *Deleted

## 2018-12-06 DIAGNOSIS — R0602 Shortness of breath: Secondary | ICD-10-CM

## 2018-12-18 NOTE — Progress Notes (Addendum)
Cardiology Office Note   Date:  12/19/2018   ID:  Rodney Lane, DOB [birth year], MRN [id number]  PCP:  System, Pcp Not In  Cardiologist:  Dr. Johnsie Cancel    Chief Complaint  Patient presents with  . Shortness of Breath      History of Present Illness: Rodney Lane is a 48 y.o. male who presents for follow up after stress test and EP visit.    He hashx of HTN, morbid obesity (BMI is 44)  o SOB since mid-May,initially felt to have bronchitis,no reported CP, palpitations, no cough, fever or symptoms of illness, neg COVID test, CXR, there is mention of tachycardia, and seems perhaps his amlodipine changed to diltiazem 120mg  and added HCTZ 25.    Admitted to [facility] 11/07/2018 with acute onset of worsened SOB, CP, palpitations. In the ER noted to be in ST 150's treated initially with adenosine that had no effect, then IVF, ASA and NTG, rates improved and symptoms improved as well. CT chest negative for PE, his COVD test here negative, no obvious signs of infection, afebrile, WBC wnl. He also had a TSH that was wnl. CRP and sed rate elevated.  Hgb A1c 6.8.    Echo with LVEF 50-55%, no WMA described, +impaired relaxation, no pericardial effusion. Pt declined stress test secondary to bad past experience, body habitus and tachycardia precludes coronary CT, did not feel invasive/cath warranted. Dr. Johnsie Cancel ultimatly did not feel ischemic evaluation was needed acutely with thoughts towards ETT or CT when COVID restrictions allow  He was seen by Dr. Curt Bears and placed on dilt 240 mg daily.  Hydrate.   Suspected ST alone.  BB stopped due to weakness and fatigue.    Last visit 11/25/18 still felt bad.  With any exertion chest pain and SOB.  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  Only time he feels well if in recliner or flat in bed. occ wheeze but not freq.  The chest pain is same as hospital with tightness and squeezing pain.  His BP is poorly controlled.   He is on both amlodipine and dilt.  Last  labs with Cr 1.26 and K+ 3.6 Cr was 1.51 on admit.  He is borderline diabetic with hgb A1c of 6.8 instructed on diet.     He had lexiscan myoview neg for ischemia, then had appt with Dr. Curt Bears  And dilt stopped and verapamil ER 360 started and losartan 50 mg daily.  Pt still with SOB so PFTs and pulmonary consult were made.  We have kept out of work.  He has not heard back for PFTs or pulmonary consult.  Today somewhat better BP is improved but continues to be especially high in the AM, he still sleeps on 2 pillows at night, mostly up at night to void no so much to SOB.  Still DOE has to clean over grocery cart in store.  HR at home still 106 to 116.  He does not use salt in diet.  Cooking at home.    Still with chest pain off and on.  He thought the imdur helped some but now back to how it was.  He also has erectile dysfunction that is new for him.     Past Medical History:  Diagnosis Date  . HTN (hypertension)   . Tachycardia    Not sure why it goes fast, but someone put him on cardizem.  Appears to be S.tach    History reviewed. No pertinent surgical  history.   Current Outpatient Medications  Medication Sig Dispense Refill  . albuterol (VENTOLIN HFA) 108 (90 Base) MCG/ACT inhaler Inhale 1-2 puffs into the lungs every 6 (six) hours as needed for wheezing or shortness of breath.    . cetirizine (ZYRTEC) 10 MG tablet Take 10 mg by mouth daily.    . hydrOXYzine (ATARAX/VISTARIL) 10 MG tablet Take 1 tablet (10 mg total) by mouth 2 (two) times daily as needed for itching or anxiety. 30 tablet 0  . isosorbide mononitrate (IMDUR) 30 MG 24 hr tablet Take 1 tablet (30 mg total) by mouth daily. 90 tablet 3  . losartan (COZAAR) 50 MG tablet Take 1 tablet (50 mg total) by mouth daily. 90 tablet 3  . magic mouthwash SOLN Take 5 mLs by mouth 3 (three) times daily as needed for mouth pain. 100 mL 1  . verapamil (VERELAN PM) 360 MG 24 hr capsule Take 1 capsule (360 mg total) by mouth at bedtime. 90  capsule 3   No current facility-administered medications for this visit.     Allergies:   Patient has no known allergies.    Social History:  The patient  reports that he has never smoked. He has never used smokeless tobacco. He reports that he does not drink alcohol or use drugs.   Family History:  The patient's family history includes Cancer in his father and mother; Diabetes in his father; Heart disease in his maternal grandfather and paternal grandfather.    ROS:  General:no colds or fevers, + weight increase  Skin:no rashes or ulcers HEENT:no blurred vision, no congestion CV:see HPI PUL:see HPI GI:no diarrhea constipation or melena, no indigestion GU:no hematuria, no dysuria, erectile dysfunction new for pt  MS:no joint pain, no claudication Neuro:no syncope, no lightheadedness Endo:+ diabetes has not been treated, no PCP HgbA1C is 6.8 , no thyroid disease  Wt Readings from Last 3 Encounters:  12/19/18 288 lb 12.8 oz (131 kg)  11/27/18 269 lb (122 kg)  11/25/18 269 lb 1.9 oz (122.1 kg)     PHYSICAL EXAM: VS:  BP (!) 122/92   Pulse (!) 107   Ht 5\' 9"  (1.753 m)   Wt 288 lb 12.8 oz (131 kg)   SpO2 98%   BMI 42.65 kg/m  , BMI Body mass index is 42.65 kg/m. General:Pleasant affect, NAD Skin:Warm and dry, brisk capillary refill HEENT:normocephalic, sclera clear, mucus membranes moist Neck:supple, no JVD, no bruits  Heart:S1S2 RRR rapid without murmur, gallup, rub or click Lungs:clear to diminished  without rales, rhonchi, or wheezes KDT:OIZT, non tender, + BS, do not palpate liver spleen or masses Ext:no lower ext edema, 2+ pedal pulses, 2+ radial pulses Neuro:alert and oriented X 3, MAE, follows commands, + facial symmetry    EKG:  EKG is ordered today. The ekg ordered today demonstrates ST at 107 no acute ST changes    Recent Labs: 11/06/2018: Magnesium 1.9; TSH 2.400 11/09/2018: BUN 12; Creatinine, Ser 1.26; Hemoglobin 12.6; Platelets 161; Potassium 3.6;  Sodium 136    Lipid Panel    Component Value Date/Time   CHOL 200 11/08/2018 0527   TRIG 519 (H) 11/08/2018 0527   HDL 29 (L) 11/08/2018 0527   CHOLHDL 6.9 11/08/2018 0527   VLDL UNABLE TO CALCULATE IF TRIGLYCERIDE OVER 400 mg/dL 11/08/2018 0527   LDLCALC UNABLE TO CALCULATE IF TRIGLYCERIDE OVER 400 mg/dL 11/08/2018 0527   LDLDIRECT 78.4 11/08/2018 0527       Other studies Reviewed: Additional studies/ records that were  reviewed today include: . Echo 11/07/18 IMPRESSIONS    1. The left ventricle has low normal systolic function, with an ejection fraction of 50-55%. The cavity size was normal. There is mildly increased left ventricular wall thickness. Left ventricular diastolic Doppler parameters are consistent with  impaired relaxation.  2. The right ventricle has normal systolic function. The cavity was normal. There is no increase in right ventricular wall thickness.  3. The aortic valve was not well visualized.  FINDINGS  Left Ventricle: The left ventricle has low normal systolic function, with an ejection fraction of 50-55%. The cavity size was normal. There is mildly increased left ventricular wall thickness. Left ventricular diastolic Doppler parameters are consistent  with impaired relaxation.  Right Ventricle: The right ventricle has normal systolic function. The cavity was normal. There is no increase in right ventricular wall thickness.  Left Atrium: Left atrial size was normal in size.  Right Atrium: Right atrial size was normal in size. Right atrial pressure is estimated at 10 mmHg.  Interatrial Septum: No atrial level shunt detected by color flow Doppler.  Pericardium: There is no evidence of pericardial effusion.  Mitral Valve: The mitral valve was not well visualized. Mitral valve regurgitation is not visualized by color flow Doppler.  Tricuspid Valve: The tricuspid valve is normal in structure. Tricuspid valve regurgitation was not visualized by  color flow Doppler.  Aortic Valve: The aortic valve was not well visualized Aortic valve regurgitation was not visualized by color flow Doppler. There is no evidence of aortic valve stenosis.  Pulmonic Valve: The pulmonic valve was normal in structure. Pulmonic valve regurgitation is not visualized by color flow Doppler.  Venous: The inferior vena cava is normal in size with greater than 50% respiratory variability.  nuc study 11/27/18   Study Highlights    Nuclear stress EF: 37%. Generalized hypokinesis noted. Dilated left ventricle.  There was no ST segment deviation noted during stress. There was no transient ischemic dilatation.  This is an intermediate risk study based upon reduction in ejection fraction. There were no perfusion defects identified suggestive of nonischemic cardiomyopathy.        ASSESSMENT AND PLAN:  1.  Chest pain with neg lexiscan myoview.  Will check with Dr. Johnsie Cancel if rt and left cardiac cath warranted.     2.  shortness of breath.  DOE will send in another PFT order and pulmonary consult.  I will check BNP today, If elevated with change HCTZ to lasix.  This is severe enough to keep out of work.  He is asking about Short term disability I have asked him to see primary care for this.    3.  HTN will increase losartan to 100 mg, this may be cause of ED  IF BNP elevated may benefit from BiDil   4.   CKD 2 need to monitor with ARB  5.  DM-2 needs PCP he has been having trouble obtaining.  He will try [place] [facility]   6.  Tachycardia improved but still 100-118 followed by Dr. Curt Bears    Late addendum.  I discussed with Dr. Johnsie Cancel and will plan for Rt and Lt heart cath.  I called pt and discussed The patient understands that risks included but are not limited to stroke (1 in 1000), death (1 in 75), kidney failure [usually temporary] (1 in 500), bleeding (1 in 200), allergic reaction [possibly serious] (1 in 200).   Pt will think about over night  and I will call tomorrow.  Addendum 12/20/18 I called pt back and he preferred NOT to do cath.  He will see pulmonology first.  He had gone to the gym today and worked out some.  He said he did well.  He will call if symptoms increase.  And keep prior follow up appts.    Current medicines are reviewed with the patient today.  The patient Has no concerns regarding medicines.  The following changes have been made:  See above Labs/ tests ordered today include:see above  Disposition:   FU:  see above  Signed, Cecilie Kicks, NP  12/19/2018 10:23 AM    [address], [place], [place], [place] Phone: [phone]; Fax: [phone]

## 2018-12-19 ENCOUNTER — Other Ambulatory Visit: Payer: Self-pay

## 2018-12-19 ENCOUNTER — Ambulatory Visit (INDEPENDENT_AMBULATORY_CARE_PROVIDER_SITE_OTHER): Payer: BC Managed Care – PPO | Admitting: Cardiology

## 2018-12-19 ENCOUNTER — Encounter: Payer: Self-pay | Admitting: Cardiology

## 2018-12-19 VITALS — BP 122/92 | HR 107 | Ht 69.0 in | Wt 288.8 lb

## 2018-12-19 DIAGNOSIS — I10 Essential (primary) hypertension: Secondary | ICD-10-CM

## 2018-12-19 DIAGNOSIS — R0789 Other chest pain: Secondary | ICD-10-CM

## 2018-12-19 DIAGNOSIS — R000 Tachycardia, unspecified: Secondary | ICD-10-CM

## 2018-12-19 DIAGNOSIS — R079 Chest pain, unspecified: Secondary | ICD-10-CM

## 2018-12-19 DIAGNOSIS — R0602 Shortness of breath: Secondary | ICD-10-CM

## 2018-12-19 DIAGNOSIS — E119 Type 2 diabetes mellitus without complications: Secondary | ICD-10-CM

## 2018-12-19 DIAGNOSIS — R0609 Other forms of dyspnea: Secondary | ICD-10-CM

## 2018-12-19 LAB — COMPREHENSIVE METABOLIC PANEL
ALT: 24 IU/L (ref 0–44)
AST: 24 IU/L (ref 0–40)
Albumin/Globulin Ratio: 1.8 (ref 1.2–2.2)
Albumin: 5.2 g/dL — ABNORMAL HIGH (ref 4.0–5.0)
Alkaline Phosphatase: 52 IU/L (ref 39–117)
BUN/Creatinine Ratio: 13 (ref 9–20)
BUN: 17 mg/dL (ref 6–24)
Bilirubin Total: 0.6 mg/dL (ref 0.0–1.2)
CO2: 23 mmol/L (ref 20–29)
Calcium: 10.7 mg/dL — ABNORMAL HIGH (ref 8.7–10.2)
Chloride: 96 mmol/L (ref 96–106)
Creatinine, Ser: 1.28 mg/dL — ABNORMAL HIGH (ref 0.76–1.27)
GFR calc Af Amer: 75 mL/min/{1.73_m2} (ref >59)
GFR calc non Af Amer: 65 mL/min/{1.73_m2} (ref >59)
Globulin, Total: 2.9 g/dL (ref 1.5–4.5)
Glucose: 106 mg/dL — ABNORMAL HIGH (ref 65–99)
Potassium: 4.1 mmol/L (ref 3.5–5.2)
Sodium: 138 mmol/L (ref 134–144)
Total Protein: 8.1 g/dL (ref 6.0–8.5)

## 2018-12-19 LAB — PRO B NATRIURETIC PEPTIDE: NT-Pro BNP: 5 pg/mL (ref 0–121)

## 2018-12-19 MED ORDER — LOSARTAN POTASSIUM 100 MG PO TABS: 100.0000 mg | ORAL_TABLET | Freq: Every day | ORAL | 3 refills | Status: DC

## 2018-12-19 NOTE — Patient Instructions (Addendum)
Medication Instructions:  Your physician has recommended you make the following change in your medication:   1. INCREASE LOSARTAN TO 100 MG DAILY. .  If you need a refill on your cardiac medications before your next appointment, please call your pharmacy.   Lab work: TODAY: CMET, BNP  If you have labs (blood work) drawn today and your tests are completely normal, you will receive your results only by: Marland Kitchen MyChart Message (if you have MyChart) OR . A paper copy in the mail If you have any lab test that is abnormal or we need to change your treatment, we will call you to review the results.  Testing/Procedures: Your physician has recommended that you have a pulmonary function test. Pulmonary Function Tests are a group of tests that measure how well air moves in and out of your lungs.  Follow-Up: At [facility], you and your health needs are our priority.  As part of our continuing mission to provide you with exceptional heart care, we have created designated Provider Care Teams.  These Care Teams include your primary Cardiologist (physician) and Advanced Practice Providers (APPs -  Physician Assistants and Nurse Practitioners) who all work together to provide you with the care you need, when you need it. You will need a follow up appointment in 1 months WITH DR. Johnsie Cancel.  Please call our office 2 months in advance to schedule this appointment.    You have been referred to TO PULMONOLOGY. THEY WILL CALL YOU TO SCHEDULE AN APPOINTMENT.   Any Other Special Instructions Will Be Listed Below (If Applicable).

## 2018-12-20 ENCOUNTER — Other Ambulatory Visit: Payer: Self-pay | Admitting: Cardiovascular Disease

## 2018-12-28 ENCOUNTER — Other Ambulatory Visit (HOSPITAL_COMMUNITY): Admission: RE | Admit: 2018-12-28 | Payer: BC Managed Care – PPO | Source: Ambulatory Visit

## 2018-12-30 ENCOUNTER — Other Ambulatory Visit (HOSPITAL_COMMUNITY)
Admission: RE | Admit: 2018-12-30 | Discharge: 2018-12-30 | Disposition: A | Discharge status: Home / Self Care | Payer: BC Managed Care – PPO | Source: Ambulatory Visit | Attending: Cardiovascular Disease | Admitting: Cardiovascular Disease

## 2018-12-30 DIAGNOSIS — Z20828 Contact with and (suspected) exposure to other viral communicable diseases: Secondary | ICD-10-CM | POA: Insufficient documentation

## 2018-12-30 DIAGNOSIS — Z01812 Encounter for preprocedural laboratory examination: Secondary | ICD-10-CM | POA: Diagnosis not present

## 2018-12-30 LAB — SARS CORONAVIRUS 2 (TAT 6-24 HRS): SARS Coronavirus 2: NEGATIVE

## 2019-01-01 DIAGNOSIS — R000 Tachycardia, unspecified: Secondary | ICD-10-CM

## 2019-01-02 ENCOUNTER — Ambulatory Visit: Payer: BC Managed Care – PPO | Admitting: Internal Medicine

## 2019-01-02 ENCOUNTER — Other Ambulatory Visit: Payer: Self-pay

## 2019-01-02 DIAGNOSIS — R0602 Shortness of breath: Secondary | ICD-10-CM

## 2019-01-02 DIAGNOSIS — R0609 Other forms of dyspnea: Secondary | ICD-10-CM

## 2019-01-02 LAB — PULMONARY FUNCTION TEST
DL/VA % pred: 135 %
DL/VA: 6.04 ml/min/mmHg/L
DLCO unc % pred: 91 %
DLCO unc: 26.19 ml/min/mmHg
FEF 25-75 Post: 2.68 L/sec
FEF 25-75 Pre: 2.58 L/sec
FEF2575-%Change-Post: 3 %
FEF2575-%Pred-Post: 81 %
FEF2575-%Pred-Pre: 78 %
FEV1-%Change-Post: 0 %
FEV1-%Pred-Post: 78 %
FEV1-%Pred-Pre: 78 %
FEV1-Post: 2.57 L
FEV1-Pre: 2.57 L
FEV1FVC-%Change-Post: 2 %
FEV1FVC-%Pred-Pre: 101 %
FEV6-%Change-Post: -2 %
FEV6-%Pred-Post: 77 %
FEV6-%Pred-Pre: 79 %
FEV6-Post: 3.06 L
FEV6-Pre: 3.15 L
FEV6FVC-%Pred-Post: 102 %
FEV6FVC-%Pred-Pre: 102 %
FVC-%Change-Post: -2 %
FVC-%Pred-Post: 75 %
FVC-%Pred-Pre: 77 %
FVC-Post: 3.06 L
FVC-Pre: 3.15 L
Post FEV1/FVC ratio: 84 %
Post FEV6/FVC ratio: 100 %
Pre FEV1/FVC ratio: 82 %
Pre FEV6/FVC Ratio: 100 %
RV % pred: 74 %
RV: 1.45 L
TLC % pred: 71 %
TLC: 4.84 L

## 2019-01-02 NOTE — Progress Notes (Signed)
Full PFT performed today. °

## 2019-01-02 NOTE — Telephone Encounter (Signed)
Call placed to pt re: mychart message below. Pt made aware that Pulmonary would have to release him back to work re: his sob. Pt will wear a 2 week heart monitor for tachycardia and aware that someone will contact him to arrange this.  Pt verbalized understanding.

## 2019-01-03 ENCOUNTER — Telehealth: Payer: Self-pay | Admitting: Radiology

## 2019-01-03 NOTE — Telephone Encounter (Signed)
Enrolled patient for a 14 day Zio monitor to be mailed. Brief instructions were gone over with patient and he knows to expect the monitor to arrive in 3-4 days.

## 2019-01-06 ENCOUNTER — Encounter: Payer: Self-pay | Admitting: Family Medicine

## 2019-01-06 ENCOUNTER — Ambulatory Visit (INDEPENDENT_AMBULATORY_CARE_PROVIDER_SITE_OTHER): Payer: BC Managed Care – PPO | Admitting: Family Medicine

## 2019-01-06 ENCOUNTER — Other Ambulatory Visit: Payer: Self-pay

## 2019-01-06 VITALS — BP 145/90 | HR 108 | Ht 69.0 in | Wt 296.8 lb

## 2019-01-06 DIAGNOSIS — I10 Essential (primary) hypertension: Secondary | ICD-10-CM

## 2019-01-06 NOTE — Patient Instructions (Addendum)
It was great to meet you today! Thank you for letting me participate in your care!  Today, we discussed your high blood pressure. I want to see what your home BP is doing before we make further adjustments. Please send me the past 2 weeks of your home readings and continue taking your home BP over the next 2 weeks. Based on these readings we will adjust your medications as needed.  I will see you in one month for follow up.  Be well, Harolyn Rutherford, DO PGY-3, [facility]

## 2019-01-06 NOTE — Progress Notes (Signed)
     Subjective: Chief Complaint  Patient presents with  . Establish Care    HPI: Rodney Lane is a 48 y.o. presenting to clinic today to discuss the following:  HTN Patient presents today to establish care and for further management of his high blood pressure. He states he was being managed by doctors in [place] but looks to move all of his care to [facility]. Recently has been experiencing episodes of heart palpitation, chest pain, and difficulty breathing with dizziness. These episodes seem random and after several ED trip he remains without a diagnosis. He does have follow up with both Cardiology and Pulmonology. He is unsure if the episodes are stress related but they are not related to exertion. He does check his BP at home and will send me the reading via MyChart. He states lately he is trying to eat better and exercise at [employer].  ROS noted in HPI.   Past Medical, Surgical, Social, and Family History Reviewed & Updated per EMR.   Pertinent Historical Findings include:   Social History   Tobacco Use  Smoking Status Never Smoker  Smokeless Tobacco Never Used    Objective: BP (!) 145/90   Pulse (!) 108   Ht 5\' 9"  (1.753 m)   Wt 296 lb 12.8 oz (134.6 kg)   SpO2 97%   BMI 43.83 kg/m  Vitals and nursing notes reviewed  Physical Exam Gen: Alert and Oriented x 3, NAD HEENT: Normocephalic, atraumatic, PERRLA, EOMI, non-erythematous turbinates, non-erythematous pharyngeal mucosa, no exudates Neck: no LAD CV: RRR, no murmurs, normal S1, S2 split Resp: CTAB, no wheezing, rales, or rhonchi, comfortable work of breathing Ext: no clubbing, cyanosis, or edema Skin: warm, dry, intact, no rashes  Assessment/Plan:  HTN (hypertension) Patient not at goal today and may benefit from modification to medication regimen given he is African-American. - Patient will send in past 2 weeks of BP readings from home and continue taking BP readings for the next 2 weeks. - If  not well controlled at home and still high in one month at the next office visit will add a second agent; amlodipine vs HCTZ - Patient will follow up with Cardiology as he already has appointment set up via his last ED visit   PATIENT EDUCATION PROVIDED: See AVS    Diagnosis and plan along with any newly prescribed medication(s) were discussed in detail with this patient today. The patient verbalized understanding and agreed with the plan. Patient advised if symptoms worsen return to clinic or ER.    Harolyn Rutherford, DO 01/06/2019, 10:09 AM PGY-3 [facility]

## 2019-01-08 NOTE — Assessment & Plan Note (Signed)
Patient not at goal today and may benefit from modification to medication regimen given he is African-American. - Patient will send in past 2 weeks of BP readings from home and continue taking BP readings for the next 2 weeks. - If not well controlled at home and still high in one month at the next office visit will add a second agent; amlodipine vs HCTZ - Patient will follow up with Cardiology as he already has appointment set up via his last ED visit

## 2019-01-09 ENCOUNTER — Encounter: Payer: Self-pay | Admitting: Family Medicine

## 2019-01-10 ENCOUNTER — Other Ambulatory Visit: Payer: Self-pay | Admitting: *Deleted

## 2019-01-10 MED ORDER — VERAPAMIL HCL ER 120 MG PO TBCR: 120.0000 mg | EXTENDED_RELEASE_TABLET | Freq: Every morning | ORAL | 3 refills | Status: DC

## 2019-01-11 ENCOUNTER — Ambulatory Visit (INDEPENDENT_AMBULATORY_CARE_PROVIDER_SITE_OTHER): Payer: BC Managed Care – PPO

## 2019-01-11 DIAGNOSIS — R000 Tachycardia, unspecified: Secondary | ICD-10-CM | POA: Diagnosis not present

## 2019-01-13 ENCOUNTER — Telehealth: Payer: Self-pay | Admitting: Family Medicine

## 2019-01-13 ENCOUNTER — Ambulatory Visit (INDEPENDENT_AMBULATORY_CARE_PROVIDER_SITE_OTHER): Payer: BC Managed Care – PPO | Admitting: Emergency Medicine

## 2019-01-13 ENCOUNTER — Other Ambulatory Visit: Payer: Self-pay

## 2019-01-13 ENCOUNTER — Encounter: Payer: Self-pay | Admitting: Emergency Medicine

## 2019-01-13 VITALS — BP 158/102 | HR 99 | Temp 98.1°F | Ht 69.0 in | Wt 296.0 lb

## 2019-01-13 DIAGNOSIS — R0600 Dyspnea, unspecified: Secondary | ICD-10-CM

## 2019-01-13 DIAGNOSIS — G4733 Obstructive sleep apnea (adult) (pediatric): Secondary | ICD-10-CM | POA: Diagnosis not present

## 2019-01-13 NOTE — Assessment & Plan Note (Signed)
Dyspnea: Etiology remains uncertain. In all a CT chest PE study did not demonstrate a prominent cardiopulmonary process to explain his symptoms. An MRI and MRA of the brain/head was read as normal.  Echocardiogram demonstrated a low normal EF at 50-55% but was absent other notable abnormality yet this was in the setting of reportedly a poor quality study due to body habitus. PFT's do demonstrate a mixed obstructive/restrictive pattern with FEV1/FVC 82 but mildly decreased FVC and FEV1. This should prompt additional consideration for a diagnosis of obesity hypoventilation syndrome and combined possible underlying mild asthma. The patient's oxygen requirements maintained at 98% and above with ambulation and he was not notably dyspneic during testing. The patient has completed a trial with a SABA albuterol inhaler as needed with only brief <3-38min of relief prior to today's visit.   Plan: Will complete OSA evaluation No current indication for additional pharmacologic therapy today Recommend strict blood pressure control with his PCP and cardiology Advised weight loss and a controlled exercise regimen  Follow up with PCP for his headaches

## 2019-01-13 NOTE — Progress Notes (Signed)
New Patient Office Visit  Subjective:  Patient ID: Rodney Lane, male    DOB: [birth year]  Age: 48 y.o. MRN: [id number]  CC:  Chief Complaint  Patient presents with  . Pulmonary Consult   HPI Rodney Lane presents for shortness of breath. He stated that this was nearly sudden in onset and occurred while at work. He was sitting across from his managers at work when he developed a posterior headache that progressively migrated into the remainder of his head and was associated with diaphoresis, chest discomfort, blurry vision, dyspnea and feeling generally disoriented. He was taken to the hospital by EMS where an extensive evaluation was completed. His symptoms continue to occur randomly but he feels the risk of occurrence is higher while out in public and with exertion. In addition, there is improvement with rest and lying in a quiet room. Events can occur while standing, sitting at rest or driving. They are variable in nature sometimes occurring with headaches, sometimes without, at others with chest discomfort but on occassion this is also absent. He is not aware of a consistent symptom or trigger but his symptoms are likely worse in the heat, with activity and in crowds.  He denied a history of smoking, EtOH use or other illicit substance use.   Past Medical History:  Diagnosis Date  . HTN (hypertension)   . Tachycardia    Not sure why it goes fast, but someone put him on cardizem.  Appears to be S.tach    No past surgical history on file.  Family History  Problem Relation Age of Onset  . Cancer Mother   . Cancer Father   . Diabetes Father   . Heart disease Maternal Grandfather   . Heart disease Paternal Grandfather     Social History   Socioeconomic History  . Marital status: Single    Spouse name: Not on file  . Number of children: Not on file  . Years of education: Not on file  . Highest education level: Not on file  Occupational History  . Not on file  Social Needs  .  Financial resource strain: Not on file  . Food insecurity    Worry: Not on file    Inability: Not on file  . Transportation needs    Medical: Not on file    Non-medical: Not on file  Tobacco Use  . Smoking status: Never Smoker  . Smokeless tobacco: Never Used  Substance and Sexual Activity  . Alcohol use: Never    Frequency: Never  . Drug use: Never  . Sexual activity: Not on file  Lifestyle  . Physical activity    Days per week: Not on file    Minutes per session: Not on file  . Stress: Not on file  Relationships  . Social Herbalist on phone: Not on file    Gets together: Not on file    Attends religious service: Not on file    Active member of club or organization: Not on file    Attends meetings of clubs or organizations: Not on file    Relationship status: Not on file  . Intimate partner violence    Fear of current or ex partner: Not on file    Emotionally abused: Not on file    Physically abused: Not on file    Forced sexual activity: Not on file  Other Topics Concern  . Not on file  Social History Narrative  . Not on file  ROS Review of Systems  Constitutional: Positive for activity change and diaphoresis. Negative for appetite change, chills, fever and unexpected weight change.  HENT: Negative for congestion, dental problem, hearing loss, sinus pain, tinnitus and trouble swallowing.   Eyes: Negative for discharge and redness.  Respiratory: Positive for chest tightness and shortness of breath. Negative for cough and wheezing.   Cardiovascular: Positive for palpitations. Negative for chest pain and leg swelling.  Gastrointestinal: Positive for abdominal distention. Negative for abdominal pain, nausea and vomiting.  Genitourinary: Negative for dysuria and hematuria.  Musculoskeletal: Negative for arthralgias, back pain, gait problem and myalgias.  Skin: Negative for color change and rash.  Neurological: Positive for light-headedness and headaches.  Negative for syncope and weakness.  Psychiatric/Behavioral: Negative for agitation, confusion and sleep disturbance. The patient is not nervous/anxious.     Objective:   Today's Vitals: BP (!) 158/102 (BP Location: Left Arm, Cuff Size: Large)   Pulse 99   Temp 98.1 F (36.7 C) (Oral)   Ht 5\' 9"  (1.753 m)   Wt 296 lb (134.3 kg)   SpO2 97%   BMI 43.71 kg/m   Physical Exam Constitutional:      General: He is not in acute distress.    Appearance: He is well-developed. He is not diaphoretic.  HENT:     Head: Normocephalic and atraumatic.  Eyes:     Conjunctiva/sclera: Conjunctivae normal.  Neck:     Musculoskeletal: Normal range of motion.  Cardiovascular:     Rate and Rhythm: Normal rate and regular rhythm.     Heart sounds: No murmur.  Pulmonary:     Effort: Pulmonary effort is normal. No respiratory distress.     Breath sounds: Normal breath sounds. No stridor.  Abdominal:     General: Bowel sounds are normal. There is no distension.     Palpations: Abdomen is soft.  Skin:    General: Skin is warm.  Neurological:     Mental Status: He is alert.    Assessment & Plan:   Problem List Items Addressed This Visit      Respiratory   OSA (obstructive sleep apnea) - Primary     Risk factors for OSA: Patient presents with uncontrolled but treated HTN, BMI >43, headaches, daytime fatigue, age of 3 with an increase neck circumference and male gender. His risk factors for OSA are high and given his symptoms warrant a sleep study evaluation.  Plan:  Split night sleep study to be ordered      Relevant Orders   Split night study     Other   Dyspnea    Dyspnea: Etiology remains uncertain. In all a CT chest PE study did not demonstrate a prominent cardiopulmonary process to explain his symptoms. An MRI and MRA of the brain/head was read as normal.  Echocardiogram demonstrated a low normal EF at 50-55% but was absent other notable abnormality yet this was in the setting of  reportedly a poor quality study due to body habitus. PFT's do demonstrate a mixed obstructive/restrictive pattern with FEV1/FVC 82 but mildly decreased FVC and FEV1. This should prompt additional consideration for a diagnosis of obesity hypoventilation syndrome and combined possible underlying mild asthma. The patient's oxygen requirements maintained at 98% and above with ambulation and he was not notably dyspneic during testing. The patient has completed a trial with a SABA albuterol inhaler as needed with only brief <3-42min of relief prior to today's visit.   Plan: Will complete OSA evaluation No current indication  for additional pharmacologic therapy today Recommend strict blood pressure control with his PCP and cardiology Advised weight loss and a controlled exercise regimen  Follow up with PCP for his headaches         Outpatient Encounter Medications as of 01/13/2019  Medication Sig  . albuterol (VENTOLIN HFA) 108 (90 Base) MCG/ACT inhaler Inhale 1-2 puffs into the lungs every 6 (six) hours as needed for wheezing or shortness of breath.  . cetirizine (ZYRTEC) 10 MG tablet Take 10 mg by mouth daily.  . hydrOXYzine (ATARAX/VISTARIL) 10 MG tablet Take 1 tablet (10 mg total) by mouth 2 (two) times daily as needed for itching or anxiety.  . isosorbide mononitrate (IMDUR) 30 MG 24 hr tablet Take 1 tablet (30 mg total) by mouth daily.  Marland Kitchen losartan (COZAAR) 100 MG tablet Take 1 tablet (100 mg total) by mouth daily.  . magic mouthwash SOLN Take 5 mLs by mouth 3 (three) times daily as needed for mouth pain.  . verapamil (CALAN-SR) 120 MG CR tablet Take 1 tablet (120 mg total) by mouth every morning.  . verapamil (VERELAN PM) 360 MG 24 hr capsule Take 1 capsule (360 mg total) by mouth at bedtime.   No facility-administered encounter medications on file as of 01/13/2019.     Follow-up: No follow-ups on file.   Kathi Ludwig, MD    Attending Note:  I have examined patient, reviewed labs,  studies and notes. I agree with the data and plans as detailed above.   48 year old gentleman with obesity, severe hypertension, has been under evaluation for chest discomfort, headache and dyspnea.  Felt to be largely related to his poorly controlled hypertension.  He was admitted in June with the symptoms as well as SVT.  A CT chest at that time did not show any evidence of pulmonary embolism did show some mild bibasilar atelectasis.  He also had an echocardiogram that showed normal RV function, diastolic dysfunction.  Subsequently a Lexiscan Myoview was negative for ischemia 11/27/2018.  He underwent pulmonary function testing prior to this visit 01/02/2019 that I reviewed, shows a restricted pattern on spirometry, some possible coexisting obstruction.  Based on this I think will be reasonable to do a trial of albuterol to see if he gets benefit.  I suspect a large part of his dyspnea is restriction, obesity, the effects of suspected OSA/OHS.  This superimposed on his uncontrolled hypertension.  Interestingly he does not have overt PAH at least on recent echocardiogram. We will perform a walking oximetry today, perform a split-night polysomnogram.  Treat him with oxygen and/or CPAP as indicated.  He will follow-up to let me know if the albuterol has been beneficial.   Baltazar Apo, MD, PhD 01/16/2019, 5:17 PM [facility] [phone] or if no answer [phone]

## 2019-01-13 NOTE — Assessment & Plan Note (Signed)
  Risk factors for OSA: Patient presents with uncontrolled but treated HTN, BMI >43, headaches, daytime fatigue, age of 70 with an increase neck circumference and male gender. His risk factors for OSA are high and given his symptoms warrant a sleep study evaluation.  Plan:  Split night sleep study to be ordered

## 2019-01-13 NOTE — Telephone Encounter (Signed)
Reviewed FMLA form and placed in PCP's box for completion.  .Bilaal Leib R Koehn Salehi, CMA  

## 2019-01-13 NOTE — Telephone Encounter (Signed)
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  Verified that patient section of form has been completed.  Last DOS/WCC with PCP was 01/06/19.  Placed form in team folder to be completed by clinical staff.  Rodney Lane

## 2019-01-13 NOTE — Progress Notes (Signed)
   Subjective:    Patient ID: Rodney Lane, male    DOB: 11/30/1970, 48 y.o.   MRN: [id number]  HPI    Review of Systems  Constitutional: Negative for fever and unexpected weight change.  HENT: Negative for congestion, dental problem, ear pain, nosebleeds, postnasal drip, rhinorrhea, sinus pressure, sneezing, sore throat and trouble swallowing.   Eyes: Negative for redness and itching.  Respiratory: Positive for shortness of breath. Negative for cough, chest tightness and wheezing.   Cardiovascular: Negative for palpitations and leg swelling.  Gastrointestinal: Negative for nausea and vomiting.  Genitourinary: Negative for dysuria.  Musculoskeletal: Negative for joint swelling.  Skin: Negative for rash.  Neurological: Positive for headaches.  Hematological: Does not bruise/bleed easily.  Psychiatric/Behavioral: Negative for dysphoric mood. The patient is not nervous/anxious.        Objective:   Physical Exam        Assessment & Plan:

## 2019-01-13 NOTE — Patient Instructions (Addendum)
We will order a split night sleep study at next available with follow-up with Dr. Lamonte Sakai as soon as possible following the completion of the study.   -The sleep study has been ordered. Please follow-up with scheduling this in order to most effectively evaluate you for sleep apnea   -Today we completed a walking test to determine the level of oxygenation which did not show a decrease.   -You may continue the albuterol inhaler as needed if this helps  -You will need to gain strict blood pressure control with your primary care doctor and cardiology   -I highly recommend considering the importance of weight loss and a controled exercise regimen with progressively increasing intensity. The combination of the two are essential to improving your symptoms and decreasing your blood pressure. It will also treat your elevated hemoglobin A1c which is often consistent with a diagnosis of diabetes. Please discuss this further with your primary care provider.

## 2019-01-15 ENCOUNTER — Telehealth: Payer: Self-pay | Admitting: *Deleted

## 2019-01-15 DIAGNOSIS — G4733 Obstructive sleep apnea (adult) (pediatric): Secondary | ICD-10-CM

## 2019-01-15 NOTE — Telephone Encounter (Signed)
Order for HST has been placed. Nothing further was needed.

## 2019-01-15 NOTE — Telephone Encounter (Signed)
-----   Message from Joellen Jersey sent at 01/15/2019 12:24 PM EDT ----- This pt insurance is out of network for the sleep center we can do a HST pt is aware and agree's to do HST I need an order Joellen Jersey

## 2019-01-20 ENCOUNTER — Other Ambulatory Visit (HOSPITAL_COMMUNITY): Payer: BC Managed Care – PPO

## 2019-01-21 ENCOUNTER — Encounter: Payer: Self-pay | Admitting: Family Medicine

## 2019-01-22 ENCOUNTER — Encounter (HOSPITAL_BASED_OUTPATIENT_CLINIC_OR_DEPARTMENT_OTHER): Payer: BC Managed Care – PPO | Admitting: Pulmonary Disease

## 2019-02-05 ENCOUNTER — Other Ambulatory Visit: Payer: Self-pay

## 2019-02-05 ENCOUNTER — Ambulatory Visit: Payer: BC Managed Care – PPO

## 2019-02-05 DIAGNOSIS — G4733 Obstructive sleep apnea (adult) (pediatric): Secondary | ICD-10-CM

## 2019-02-06 ENCOUNTER — Other Ambulatory Visit: Payer: Self-pay

## 2019-02-06 ENCOUNTER — Other Ambulatory Visit: Payer: Self-pay | Admitting: Family Medicine

## 2019-02-06 ENCOUNTER — Encounter: Payer: Self-pay | Admitting: Family Medicine

## 2019-02-06 ENCOUNTER — Ambulatory Visit (INDEPENDENT_AMBULATORY_CARE_PROVIDER_SITE_OTHER): Payer: BC Managed Care – PPO | Admitting: Family Medicine

## 2019-02-06 VITALS — BP 142/90 | HR 96 | Wt 294.6 lb

## 2019-02-06 DIAGNOSIS — I10 Essential (primary) hypertension: Secondary | ICD-10-CM

## 2019-02-06 DIAGNOSIS — Z23 Encounter for immunization: Secondary | ICD-10-CM | POA: Diagnosis not present

## 2019-02-06 MED ORDER — SILDENAFIL CITRATE 50 MG PO TABS: 50.0000 mg | ORAL_TABLET | Freq: Every day | ORAL | 4 refills | Status: DC | PRN

## 2019-02-06 MED ORDER — HYDROCHLOROTHIAZIDE 25 MG PO TABS: 25.0000 mg | ORAL_TABLET | Freq: Every day | ORAL | 3 refills | Status: DC

## 2019-02-06 NOTE — Patient Instructions (Signed)
It was great to see you today! Thank you for letting me participate in your care!  Today, we discussed your continued high blood pressure. I am starting you on a new medication called hydrochlorothiazide. Please take it as directed. It will make you urinate more frequently.   Please return in 2 weeks for bloodwork. I will see you in 2 months.   Be well, Harolyn Rutherford, DO PGY-3, [facility]

## 2019-02-06 NOTE — Assessment & Plan Note (Signed)
Chronic. Poorly controlled at home since last visit. - Start HCTZ 25mg  daily - BMP in 2 weeks - F/u in 2 months with me to reassess HTN - F/u with Cardiology

## 2019-02-06 NOTE — Addendum Note (Signed)
Addended by: Junious Dresser on: 02/06/2019 09:24 AM   Modules accepted: Orders

## 2019-02-06 NOTE — Progress Notes (Signed)
     Subjective: Chief Complaint  Patient presents with  . Follow-up     HPI: Rodney Lane is a 48 y.o. presenting to clinic today to discuss the following:  Blood Pressure Patient returns today for further management of his HTN. His BP records from home shows he is not well controlled. He is consistently over [id number] systolic and 90 diastolic at home. He continues to have chest pains that originally brought him to our office and headaches. He also endorses some episodes of blurry vision. No active chest pain today or shortness of breath.  ED Patient has history of erectile dysfunction and requests refill of Viagra. He has taken it in the past with good results. We discussed the interaction it could have with IMDUR and recommended he not take them together or on the same day.      ROS noted in HPI.   Past Medical, Surgical, Social, and Family History Reviewed & Updated per EMR.   Pertinent Historical Findings include:   Social History   Tobacco Use  Smoking Status Never Smoker  Smokeless Tobacco Never Used    Objective: BP (!) 142/90   Pulse 96   Wt 294 lb 9.6 oz (133.6 kg)   SpO2 96%   BMI 43.50 kg/m  Vitals and nursing notes reviewed  Physical Exam Gen: Alert and Oriented x 3, NAD HEENT: Normocephalic, atraumatic CV: RRR, no murmurs, normal S1, S2 split Resp: CTAB, no wheezing, rales, or rhonchi, comfortable work of breathing Ext: no clubbing, cyanosis, or edema Skin: warm, dry, intact, no rashes  Assessment/Plan:  HTN (hypertension) Chronic. Poorly controlled at home since last visit. - Start HCTZ 25mg  daily - BMP in 2 weeks - F/u in 2 months with me to reassess HTN - F/u with Cardiology   PATIENT EDUCATION PROVIDED: See AVS    Diagnosis and plan along with any newly prescribed medication(s) were discussed in detail with this patient today. The patient verbalized understanding and agreed with the plan. Patient advised if symptoms worsen return to clinic or  ER.   Health Maintainance: Influenza vaccine, Tetanus vaccine   Orders Placed This Encounter  Procedures  . Basic Metabolic Panel    Standing Status:   Future    Standing Expiration Date:   03/08/2019    Meds ordered this encounter  Medications  . hydrochlorothiazide (HYDRODIURIL) 25 MG tablet    Sig: Take 1 tablet (25 mg total) by mouth daily.    Dispense:  90 tablet    Refill:  3  . sildenafil (VIAGRA) 50 MG tablet    Sig: Take 1 tablet (50 mg total) by mouth daily as needed for erectile dysfunction.    Dispense:  5 tablet    Refill:  [place], DO 02/06/2019, 8:33 AM PGY-3 [facility]

## 2019-02-09 ENCOUNTER — Encounter: Payer: Self-pay | Admitting: Family Medicine

## 2019-02-10 ENCOUNTER — Encounter: Payer: Self-pay | Admitting: Family Medicine

## 2019-02-10 ENCOUNTER — Other Ambulatory Visit: Payer: Self-pay | Admitting: Family Medicine

## 2019-02-10 MED ORDER — TADALAFIL 10 MG PO TABS: 10.0000 mg | ORAL_TABLET | Freq: Every day | ORAL | 2 refills | Status: DC | PRN

## 2019-02-10 NOTE — Progress Notes (Signed)
Patient requesting tadalafil instead of sildenafil. We did discuss the potential risks of taking this with nitrates. Patient states he is not taking IMDUR regularly and I informed him not to take tadalafil and IMDUR on the same day. He should stop immediately if he develops dizziness, feels faint, or develops chest pain.

## 2019-02-11 ENCOUNTER — Telehealth: Payer: Self-pay

## 2019-02-11 DIAGNOSIS — G4733 Obstructive sleep apnea (adult) (pediatric): Secondary | ICD-10-CM

## 2019-02-11 NOTE — Telephone Encounter (Signed)
Recommend CPAP therapy for severe obstructive sleep apnea.  Auto titration  CPAP with pressure settings of 5-20 will be appropriate.  Weight loss measures encouraged.  Caution against driving when sleepy and against medications with sedative side effects

## 2019-02-11 NOTE — Telephone Encounter (Signed)
Advised pt of results. Pt understood and nothing further is needed.   CPAP ordered.  

## 2019-02-12 ENCOUNTER — Encounter: Payer: Self-pay | Admitting: Family Medicine

## 2019-02-13 NOTE — Progress Notes (Signed)
Cardiology Office Note   Date:  02/26/2019   ID:  Rodney Lane, DOB [birth year], MRN [id number]  PCP:  Nuala Alpha, DO  Cardiologist:  Dr. Johnsie Cancel    No chief complaint on file.     History of Present Illness: Rodney Lane is a 48 y.o. male who presents for follow up after stress test and EP visit.    He hashx of HTN, morbid obesity (BMI is 44)  o SOB since mid-May,initially felt to have bronchitis,no reported CP, palpitations, no cough, fever or symptoms of illness, neg COVID test,   Admitted to [facility] 11/07/2018 with acute onset of worsened SOB, CP, palpitations. In the ER noted to be in ST 150's treated initially with adenosine that had no effect, then IVF, ASA and NTG, rates improved and symptoms improved as well. CT chest negative for PE, his COVD test here negative, no obvious signs of infection, afebrile, WBC wnl. He also had a TSH that was wnl. CRP and sed rate elevated.  Hgb A1c 6.8.    Echo with LVEF 50-55%, no WMA described, +impaired relaxation, no pericardial effusion. Pt declined stress test secondary to bad past experience, body habitus and tachycardia precludes coronary CT  He was seen by Dr. Curt Bears and placed on dilt 240 mg daily.  Hydrate.   Suspected ST alone.  BB stopped due to weakness and fatigue.    Last visit 11/25/18 still felt bad.He had lexiscan myoview neg for ischemia, then had appt with Dr. Curt Bears  And dilt stopped and verapamil ER 360 started and losartan 50 mg daily.  Pt still with SOB so PFTs and pulmonary consult were made.  We have kept out of work.  He has not heard back for PFTs or pulmonary consult.  Patient offered right and left heart cath but declined PFTls reviewed 01/02/19 were normal with minimal small airway dx Getting his CPAP arranged through Dr Lamonte Sakai   He has multiple somatic complaints that all seem to be related to being out of shape and overweight Includes headaches, loss of appetite and muscle aches and pains      Past  Medical History:  Diagnosis Date  . HTN (hypertension)   . Tachycardia    Not sure why it goes fast, but someone put him on cardizem.  Appears to be S.tach    History reviewed. No pertinent surgical history.   Current Outpatient Medications  Medication Sig Dispense Refill  . albuterol (VENTOLIN HFA) 108 (90 Base) MCG/ACT inhaler Inhale 1-2 puffs into the lungs every 6 (six) hours as needed for wheezing or shortness of breath.    . hydrochlorothiazide (HYDRODIURIL) 25 MG tablet Take 1 tablet (25 mg total) by mouth daily. 90 tablet 3  . hydrOXYzine (ATARAX/VISTARIL) 10 MG tablet Take 1 tablet (10 mg total) by mouth 2 (two) times daily as needed for itching or anxiety. 30 tablet 0  . losartan (COZAAR) 100 MG tablet Take 1 tablet (100 mg total) by mouth daily. 90 tablet 3  . verapamil (CALAN-SR) 120 MG CR tablet Take 1 tablet (120 mg total) by mouth every morning. 90 tablet 3  . verapamil (VERELAN PM) 360 MG 24 hr capsule Take 1 capsule (360 mg total) by mouth at bedtime. 90 capsule 3   No current facility-administered medications for this visit.     Allergies:   Patient has no known allergies.    Social History:  The patient  reports that he has never smoked. He has never used smokeless tobacco. He  reports that he does not drink alcohol or use drugs.   Family History:  The patient's family history includes Cancer in his father and mother; Diabetes in his father; Heart disease in his maternal grandfather and paternal grandfather.    ROS:  General:no colds or fevers, + weight increase  Skin:no rashes or ulcers HEENT:no blurred vision, no congestion CV:see HPI PUL:see HPI GI:no diarrhea constipation or melena, no indigestion GU:no hematuria, no dysuria, erectile dysfunction new for pt  MS:no joint pain, no claudication Neuro:no syncope, no lightheadedness Endo:+ diabetes has not been treated, no PCP HgbA1C is 6.8 , no thyroid disease  Wt Readings from Last 3 Encounters:  02/26/19  296 lb 6.4 oz (134.4 kg)  02/06/19 294 lb 9.6 oz (133.6 kg)  01/13/19 296 lb (134.3 kg)     PHYSICAL EXAM: VS:  BP 140/88   Pulse 85   Ht 5\' 9"  (1.753 m)   Wt 296 lb 6.4 oz (134.4 kg)   SpO2 98%   BMI 43.77 kg/m  , BMI Body mass index is 43.77 kg/m.  Affect appropriate Overweight black male  HEENT: normal Neck supple with no adenopathy JVP normal no bruits no thyromegaly Lungs clear with no wheezing and good diaphragmatic motion Heart:  S1/S2 no murmur, no rub, gallop or click PMI normal Abdomen: benighn, BS positve, no tenderness, no AAA no bruit.  No HSM or HJR Distal pulses intact with no bruits No edema Neuro non-focal Skin warm and dry No muscular weakness    EKG:  EKG is ordered today. The ekg ordered today demonstrates ST at 107 no acute ST changes    Recent Labs: 11/06/2018: Magnesium 1.9; TSH 2.400 11/09/2018: Hemoglobin 12.6; Platelets 161 12/19/2018: ALT 24; NT-Pro BNP 5 02/19/2019: BUN 23; Creatinine, Ser 1.31; Potassium 4.0; Sodium 139    Lipid Panel    Component Value Date/Time   CHOL 200 11/08/2018 0527   TRIG 519 (H) 11/08/2018 0527   HDL 29 (L) 11/08/2018 0527   CHOLHDL 6.9 11/08/2018 0527   VLDL UNABLE TO CALCULATE IF TRIGLYCERIDE OVER 400 mg/dL 11/08/2018 0527   LDLCALC UNABLE TO CALCULATE IF TRIGLYCERIDE OVER 400 mg/dL 11/08/2018 0527   LDLDIRECT 78.4 11/08/2018 0527       Other studies Reviewed: Additional studies/ records that were reviewed today include: . Echo 11/07/18 IMPRESSIONS    1. The left ventricle has low normal systolic function, with an ejection fraction of 50-55%. The cavity size was normal. There is mildly increased left ventricular wall thickness. Left ventricular diastolic Doppler parameters are consistent with  impaired relaxation.  2. The right ventricle has normal systolic function. The cavity was normal. There is no increase in right ventricular wall thickness.  3. The aortic valve was not well  visualized.  FINDINGS  Left Ventricle: The left ventricle has low normal systolic function, with an ejection fraction of 50-55%. The cavity size was normal. There is mildly increased left ventricular wall thickness. Left ventricular diastolic Doppler parameters are consistent  with impaired relaxation.  Right Ventricle: The right ventricle has normal systolic function. The cavity was normal. There is no increase in right ventricular wall thickness.  Left Atrium: Left atrial size was normal in size.  Right Atrium: Right atrial size was normal in size. Right atrial pressure is estimated at 10 mmHg.  Interatrial Septum: No atrial level shunt detected by color flow Doppler.  Pericardium: There is no evidence of pericardial effusion.  Mitral Valve: The mitral valve was not well visualized. Mitral valve  regurgitation is not visualized by color flow Doppler.  Tricuspid Valve: The tricuspid valve is normal in structure. Tricuspid valve regurgitation was not visualized by color flow Doppler.  Aortic Valve: The aortic valve was not well visualized Aortic valve regurgitation was not visualized by color flow Doppler. There is no evidence of aortic valve stenosis.  Pulmonic Valve: The pulmonic valve was normal in structure. Pulmonic valve regurgitation is not visualized by color flow Doppler.  Venous: The inferior vena cava is normal in size with greater than 50% respiratory variability.  nuc study 11/27/18   Study Highlights    Nuclear stress EF: 37%. Generalized hypokinesis noted. Dilated left ventricle.  There was no ST segment deviation noted during stress. There was no transient ischemic dilatation.  This is an intermediate risk study based upon reduction in ejection fraction. There were no perfusion defects identified suggestive of nonischemic cardiomyopathy.        ASSESSMENT AND PLAN:  1.  Chest pain with neg lexiscan myoview done 11/27/18  Observe   2.  Dyspnea:  Non  cardiac EF 50-55% by echo no valve disease or evidence of pulmonary HTN BNP was normal on 12/19/18  CTA negative for PE on 11/07/18 PFTls ok see below f/u echo in a year   3.  HTN on ACE and diuretic improved f/u primary   4.   CKD 2 need to monitor with ARB Cr is 1.28   5.  DM-2 Discussed low carb diet.  Target hemoglobin A1c is 6.5 or less.  Continue current medications.   6.  Tachycardia improved 85 today followed by Dr. Curt Bears   7.  OSA:  Start CPAP per Dr Lamonte Sakai  Patient is clear to go back to work at [employer] from cardiac perspective     Disposition:   FU:  see above  Signed, Jenkins Rouge, MD  02/26/2019 8:50 AM    [address], [place], [place], [place] Phone: [phone]; Fax: [phone]

## 2019-02-19 ENCOUNTER — Other Ambulatory Visit: Payer: Self-pay

## 2019-02-19 ENCOUNTER — Other Ambulatory Visit: Payer: BC Managed Care – PPO

## 2019-02-19 DIAGNOSIS — I10 Essential (primary) hypertension: Secondary | ICD-10-CM

## 2019-02-20 LAB — BASIC METABOLIC PANEL
BUN/Creatinine Ratio: 18 (ref 9–20)
BUN: 23 mg/dL (ref 6–24)
CO2: 23 mmol/L (ref 20–29)
Calcium: 10.4 mg/dL — ABNORMAL HIGH (ref 8.7–10.2)
Chloride: 98 mmol/L (ref 96–106)
Creatinine, Ser: 1.31 mg/dL — ABNORMAL HIGH (ref 0.76–1.27)
GFR calc Af Amer: 73 mL/min/{1.73_m2} (ref >59)
GFR calc non Af Amer: 63 mL/min/{1.73_m2} (ref >59)
Glucose: 128 mg/dL — ABNORMAL HIGH (ref 65–99)
Potassium: 4 mmol/L (ref 3.5–5.2)
Sodium: 139 mmol/L (ref 134–144)

## 2019-02-25 ENCOUNTER — Encounter: Payer: Self-pay | Admitting: Family Medicine

## 2019-02-26 ENCOUNTER — Ambulatory Visit (INDEPENDENT_AMBULATORY_CARE_PROVIDER_SITE_OTHER): Payer: BC Managed Care – PPO | Admitting: Cardiovascular Disease

## 2019-02-26 ENCOUNTER — Other Ambulatory Visit: Payer: Self-pay

## 2019-02-26 ENCOUNTER — Encounter: Payer: Self-pay | Admitting: Cardiovascular Disease

## 2019-02-26 VITALS — BP 140/88 | HR 85 | Ht 69.0 in | Wt 296.4 lb

## 2019-02-26 DIAGNOSIS — R0600 Dyspnea, unspecified: Secondary | ICD-10-CM

## 2019-02-26 NOTE — Patient Instructions (Signed)
Medication Instructions:   If you need a refill on your cardiac medications before your next appointment, please call your pharmacy.   Lab work:  If you have labs (blood work) drawn today and your tests are completely normal, you will receive your results only by: Marland Kitchen MyChart Message (if you have MyChart) OR . A paper copy in the mail If you have any lab test that is abnormal or we need to change your treatment, we will call you to review the results.  Testing/Procedures: Your physician has requested that you have an echocardiogram in one year. Echocardiography is a painless test that uses sound waves to create images of your heart. It provides your doctor with information about the size and shape of your heart and how well your heart's chambers and valves are working. This procedure takes approximately one hour. There are no restrictions for this procedure.   Follow-Up: At [facility], you and your health needs are our priority.  As part of our continuing mission to provide you with exceptional heart care, we have created designated Provider Care Teams.  These Care Teams include your primary Cardiologist (physician) and Advanced Practice Providers (APPs -  Physician Assistants and Nurse Practitioners) who all work together to provide you with the care you need, when you need it. You will need a follow up appointment in 12 months.  Please call our office 2 months in advance to schedule this appointment.  You may see Dr. Alvan Dame (AT [place] or [address] office) or one of the following Electrical engineer on your designated Care Team:   Truitt Merle, NP Cecilie Kicks, NP . Kathyrn Drown, NP

## 2019-03-04 ENCOUNTER — Encounter: Payer: Self-pay | Admitting: Family Medicine

## 2019-03-06 ENCOUNTER — Telehealth (INDEPENDENT_AMBULATORY_CARE_PROVIDER_SITE_OTHER): Payer: BC Managed Care – PPO | Admitting: Family Medicine

## 2019-03-06 ENCOUNTER — Other Ambulatory Visit: Payer: Self-pay

## 2019-03-06 DIAGNOSIS — R0602 Shortness of breath: Secondary | ICD-10-CM

## 2019-03-06 DIAGNOSIS — F419 Anxiety disorder, unspecified: Secondary | ICD-10-CM

## 2019-03-06 DIAGNOSIS — R079 Chest pain, unspecified: Secondary | ICD-10-CM

## 2019-03-06 DIAGNOSIS — R000 Tachycardia, unspecified: Secondary | ICD-10-CM

## 2019-03-06 MED ORDER — SERTRALINE HCL 25 MG PO TABS: 25.0000 mg | ORAL_TABLET | Freq: Every day | ORAL | 2 refills | Status: DC

## 2019-03-06 NOTE — Progress Notes (Signed)
[place] Telemedicine Visit  Patient consented to have virtual visit. Method of visit: Telephone  Encounter participants: Patient: Rodney Lane - located at home  Provider: Nuala Alpha - located at [facility] Others (if applicable): none  Chief Complaint: Cough, cold symptoms, continued chest pain  HPI:  Patient with history of sinus tachycardia, palpitations with shortness of breath not related to exertion. He has been seen in the ED several times and now by outpatient pulmonology and cardiology all of which have signed off with recommendations he control his blood pressure. Patient reports he is still having these episodes that seem to occur at random. We had discussed this being related to anxiety before vs deconditioning and now patient think anxiety is a possible factor. He would like to be started on something to see if it helps his symptoms.  ROS: per HPI  Pertinent PMHx: Palpitations, Sinus Tachycardia  Exam:  Respiratory: speaking in full sentences without needing to stop and catch a breath  Assessment/Plan:  Anxiety Given patient has been seen by Cardiology and Pulmonology and no etiology could be found in the cardiovascular or pulmonary systems to explain his symptoms I do think it is reasonable to start treating him for anxiety. Ideally, patient would come in and undergo GAD-7 and PHQ-9 screens for anxiety and depression however, he would like to avoid clinic as much as possible and given he reported a cough his was made a virtual visit. - Start Sertarline 25mg  daily for one week, then increase to 50mg  and continue for one month. F/u with me in 2 weeks. Can be virtual - Hydroxyzine for acute symptoms. Educated patient it will make him drowsy so no driving or operating machinery after taking   Time spent during visit with patient: >15 minutes  Rodney Lane, [place], PGY-3

## 2019-03-11 ENCOUNTER — Ambulatory Visit: Payer: BC Managed Care – PPO | Admitting: Cardiology

## 2019-03-11 DIAGNOSIS — F419 Anxiety disorder, unspecified: Secondary | ICD-10-CM | POA: Insufficient documentation

## 2019-03-11 NOTE — Assessment & Plan Note (Signed)
Given patient has been seen by Cardiology and Pulmonology and no etiology could be found in the cardiovascular or pulmonary systems to explain his symptoms I do think it is reasonable to start treating him for anxiety. Ideally, patient would come in and undergo GAD-7 and PHQ-9 screens for anxiety and depression however, he would like to avoid clinic as much as possible and given he reported a cough his was made a virtual visit. - Start Sertarline 25mg  daily for one week, then increase to 50mg  and continue for one month. F/u with me in 2 weeks. Can be virtual - Hydroxyzine for acute symptoms. Educated patient it will make him drowsy so no driving or operating machinery after taking

## 2019-07-07 ENCOUNTER — Ambulatory Visit: Payer: BC Managed Care – PPO | Admitting: Family Medicine

## 2019-07-15 ENCOUNTER — Other Ambulatory Visit: Payer: Self-pay

## 2019-07-15 ENCOUNTER — Ambulatory Visit (INDEPENDENT_AMBULATORY_CARE_PROVIDER_SITE_OTHER): Payer: BC Managed Care – PPO | Admitting: Family Medicine

## 2019-07-15 ENCOUNTER — Encounter (HOSPITAL_COMMUNITY): Payer: Self-pay | Admitting: Emergency Medicine

## 2019-07-15 ENCOUNTER — Emergency Department (HOSPITAL_COMMUNITY): Payer: BC Managed Care – PPO

## 2019-07-15 ENCOUNTER — Emergency Department (HOSPITAL_COMMUNITY)
Admission: EM | Admit: 2019-07-15 | Discharge: 2019-07-15 | Disposition: A | Discharge status: Home / Self Care | Payer: BC Managed Care – PPO | Attending: Emergency Medicine | Admitting: Emergency Medicine

## 2019-07-15 VITALS — BP 144/90 | HR 92 | Wt 296.6 lb

## 2019-07-15 DIAGNOSIS — R079 Chest pain, unspecified: Secondary | ICD-10-CM | POA: Diagnosis present

## 2019-07-15 DIAGNOSIS — I10 Essential (primary) hypertension: Secondary | ICD-10-CM | POA: Diagnosis not present

## 2019-07-15 DIAGNOSIS — Z79899 Other long term (current) drug therapy: Secondary | ICD-10-CM | POA: Insufficient documentation

## 2019-07-15 DIAGNOSIS — I209 Angina pectoris, unspecified: Secondary | ICD-10-CM

## 2019-07-15 DIAGNOSIS — R0789 Other chest pain: Secondary | ICD-10-CM | POA: Insufficient documentation

## 2019-07-15 HISTORY — DX: Chest pain, unspecified: R07.9

## 2019-07-15 LAB — BASIC METABOLIC PANEL
Anion gap: 12 (ref 5–15)
BUN: 13 mg/dL (ref 6–20)
CO2: 24 mmol/L (ref 22–32)
Calcium: 9.5 mg/dL (ref 8.9–10.3)
Chloride: 102 mmol/L (ref 98–111)
Creatinine, Ser: 1.13 mg/dL (ref 0.61–1.24)
GFR calc Af Amer: >60 mL/min (ref >60)
GFR calc non Af Amer: >60 mL/min (ref >60)
Glucose, Bld: 112 mg/dL — ABNORMAL HIGH (ref 70–99)
Potassium: 4.1 mmol/L (ref 3.5–5.1)
Sodium: 138 mmol/L (ref 135–145)

## 2019-07-15 LAB — CBC
HCT: 43.4 % (ref 39.0–52.0)
Hemoglobin: 14.1 g/dL (ref 13.0–17.0)
MCH: 30.5 pg (ref 26.0–34.0)
MCHC: 32.5 g/dL (ref 30.0–36.0)
MCV: 93.7 fL (ref 80.0–100.0)
Platelets: 233 10*3/uL (ref 150–400)
RBC: 4.63 MIL/uL (ref 4.22–5.81)
RDW: 12.9 % (ref 11.5–15.5)
WBC: 7.7 10*3/uL (ref 4.0–10.5)
nRBC: 0 % (ref 0.0–0.2)

## 2019-07-15 LAB — TROPONIN I (HIGH SENSITIVITY)
Troponin I (High Sensitivity): 2 ng/L (ref <18)
Troponin I (High Sensitivity): 3 ng/L (ref <18)

## 2019-07-15 IMAGING — CR DG CHEST 2V
2 series · 2 of 2 positions shown · non-contrast
Comparison: [DATE]

CLINICAL DATA: Chest pain

EXAM:
CHEST - 2 VIEW

[chest pa]
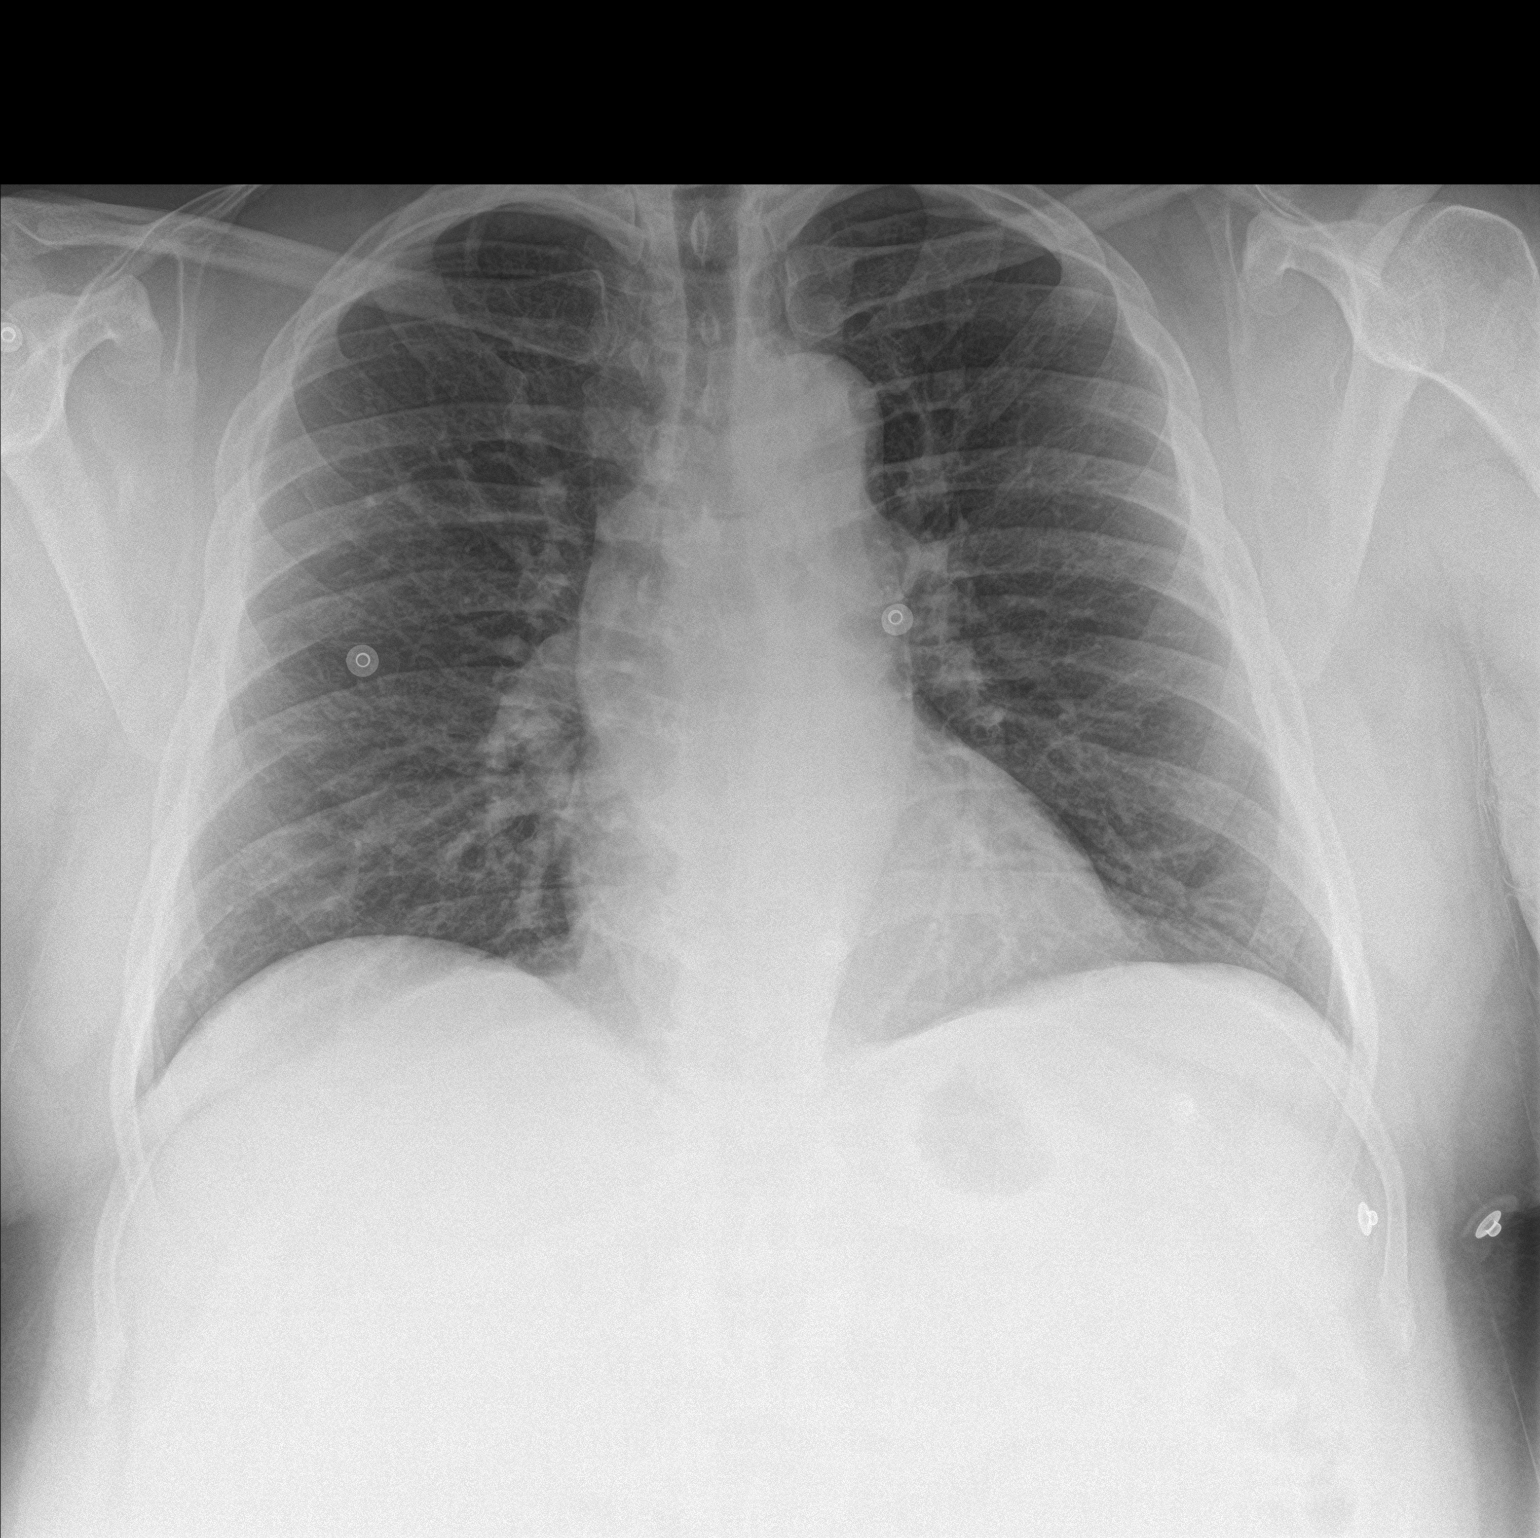

[chest lat]
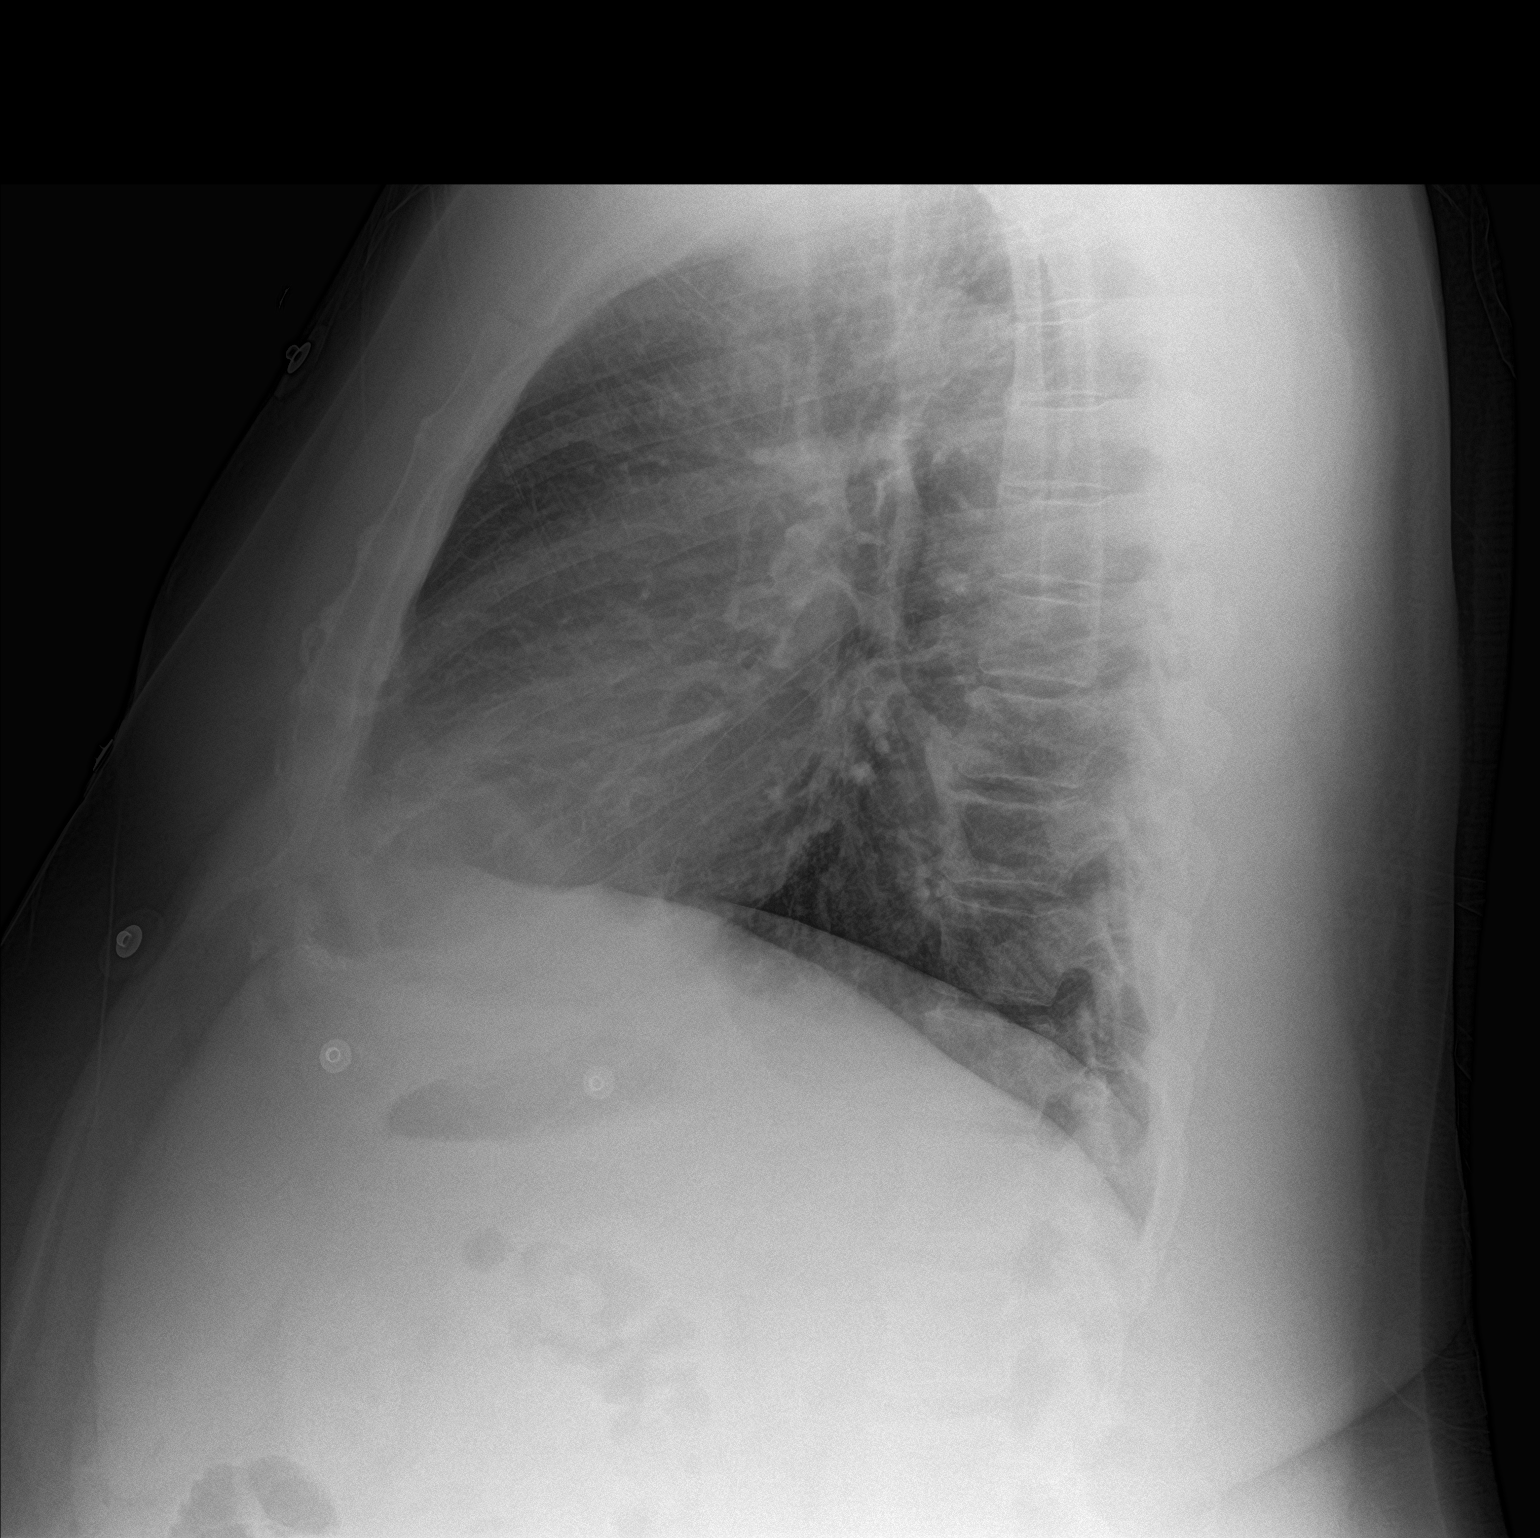

[2 of 2 positions shown; findings below may reference images not displayed]

FINDINGS: The heart size and mediastinal contours are stable. No focal
airspace consolidation, pleural effusion, or pneumothorax. The
visualized skeletal structures are unremarkable.
IMPRESSION: No active cardiopulmonary disease.

## 2019-07-15 MED ORDER — NITROGLYCERIN 0.4 MG SL SUBL
0.4000 mg | SUBLINGUAL_TABLET | Freq: Once | SUBLINGUAL | Status: AC
  Administered 2019-07-15: 16:00:00 0.4 mg via SUBLINGUAL

## 2019-07-15 MED ORDER — ASPIRIN 325 MG PO TABS
325.0000 mg | ORAL_TABLET | Freq: Once | ORAL | Status: AC
  Administered 2019-07-15: 16:00:00 325 mg via ORAL

## 2019-07-15 MED ORDER — SODIUM CHLORIDE 0.9% FLUSH: 3.0000 mL | Freq: Once | INTRAVENOUS | Status: DC

## 2019-07-15 MED ORDER — KETOROLAC TROMETHAMINE 30 MG/ML IJ SOLN
30.0000 mg | Freq: Once | INTRAMUSCULAR | Status: AC
  Administered 2019-07-15: 30 mg via INTRAVENOUS
  Filled 2019-07-15: qty 1

## 2019-07-15 NOTE — Addendum Note (Signed)
Addended by: Ezzard Flax on: 07/15/2019 03:22 PM   Modules accepted: Orders

## 2019-07-15 NOTE — Discharge Instructions (Addendum)
Your workup today was reassuring. No signs of heart attack.  Continue to take your medications as prescribed.  Call your cardiologist tomorrow to schedule follow-up.  Ideally this would be in the next 1 to 2 weeks. Return to the emergency department if any concerning signs or symptoms develop such as high fevers, loss of consciousness, worsening chest pain or shortness of breath.

## 2019-07-15 NOTE — Assessment & Plan Note (Signed)
Atypical chest pain, active. EKG normal in office. Given ASA  325mg  x 1 and Nitro.  - He has had cardiac workup in the past and it has been negative. He states it feels like it does in the past. Given his risk factors of DM, HLD, and HTN I feel like he warrants further workup in the ED.

## 2019-07-15 NOTE — ED Triage Notes (Signed)
BIB EMS from PCP. Reports intermittent CP X2 weeks. States pain radiates all over body. Notices episodes happen more frequently when at work and under stressful situations. Hx anxiety. Pt in NAD. VSS.

## 2019-07-15 NOTE — Progress Notes (Signed)
   CHIEF COMPLAINT / HPI:  Chest Pain Rodney Lane is a 49 y/o male who presents with active chest pain. He states it is located substernal on the left side of his chest and has been worse over the last week. The pain lasts 15-30 minutes in duration is described as someone reaching into his chest and squeezing and twisting his heart. He states this has been occurring at least twice a day and today the pain is the worst it has been. He has associated headache and radiating jaw pain with SOB. It is not reproducible on palpation. He states he has been complaint in his medications. No pain on deep inspiration.  PERTINENT  PMH / PSH: T2DM, HTN, HLD, Chest Pain, Anxiety  OBJECTIVE: BP (!) 144/90   Pulse 92   Wt 296 lb 9.6 oz (134.5 kg)   SpO2 96%   BMI 43.80 kg/m   Gen: Alert and Oriented x 3, NAD CV: RRR, no murmurs, normal S1, S2 split Resp: CTAB, no wheezing, rales, or rhonchi, comfortable work of breathing  Ext: no clubbing, cyanosis, or edema Skin: warm, dry, intact, no rashes  ASSESSMENT / PLAN:  Chest pain at rest Atypical chest pain, active. EKG normal in office. Given ASA  325mg  x 1 and Nitro.  - He has had cardiac workup in the past and it has been negative. He states it feels like it does in the past. Given his risk factors of DM, HLD, and HTN I feel like he warrants further workup in the ED.     Nuala Alpha, [place]

## 2019-07-15 NOTE — ED Provider Notes (Signed)
[facility] EMERGENCY DEPARTMENT Provider Note   CSN: [id number] Arrival date & time: 07/15/19  1529     History Chief Complaint  Patient presents with  . Chest Pain    Rodney Lane is a 49 y.o. male with history of hypertension, renal insufficiency, OSA presents for evaluation of acute onset, progressively worsening chest pains for 2 weeks.  Reports the pains have been mostly intermittent.  He describes it as a severe pressure and squeezing sensation to the substernal chest region which will radiate to both arms, up to the jaw and head.  Denies numbness or weakness of the extremities, shortness of breath, nausea, vomiting, abdominal pain.  No fevers or chills.  He reports he will have episodes lasting around 15 to 20 minutes at a time twice daily.  He notes the symptoms come on randomly, sometimes feel as though they are worse when he is exerting himself at work but will also come on at rest at home.  Today he had an episode that was more severe and has been constant since it began.  He went to his PCP where he was given 325 mg of aspirin and 1 sublingual nitroglycerin without relief of symptoms.  His EKG was reportedly normal in the office.  He has had a cardiac work-up last year which was negative.  He reports his symptoms feel similar to when he had admission for chest pain in June of last year.  His hemoglobin A1c in June was 6.8% suggesting he is diabetic.  He is a non-smoker, denies recreational drug use or alcohol use.  He denies any recent travel or surgeries, hemoptysis, prior history of DVT or PE, leg swelling or leg pain, or hormone replacement therapy.   The history is provided by the patient.       Past Medical History:  Diagnosis Date  . HTN (hypertension)   . Tachycardia    Not sure why it goes fast, but someone put him on cardizem.  Appears to be S.tach    Patient Active Problem List   Diagnosis Date Noted  . Chest pain at rest 07/15/2019  . Anxiety  03/11/2019  . OSA (obstructive sleep apnea) 01/13/2019  . Dyspnea 01/13/2019  . Chest pain, rule out acute myocardial infarction 11/07/2018  . Renal insufficiency 11/07/2018  . HTN (hypertension) 11/07/2018  . Sinus tachycardia 11/07/2018    History reviewed. No pertinent surgical history.     Family History  Problem Relation Age of Onset  . Cancer Mother   . Cancer Father   . Diabetes Father   . Heart disease Maternal Grandfather   . Heart disease Paternal Grandfather     Social History   Tobacco Use  . Smoking status: Never Smoker  . Smokeless tobacco: Never Used  Substance Use Topics  . Alcohol use: Never  . Drug use: Never    Home Medications Prior to Admission medications   Medication Sig Start Date End Date Taking? Authorizing Provider  albuterol (VENTOLIN HFA) 108 (90 Base) MCG/ACT inhaler Inhale 1-2 puffs into the lungs every 6 (six) hours as needed for wheezing or shortness of breath.    [provider]  hydrochlorothiazide (HYDRODIURIL) 25 MG tablet Take 1 tablet (25 mg total) by mouth daily. 02/06/19   Nuala Alpha, DO  hydrOXYzine (ATARAX/VISTARIL) 10 MG tablet Take 1 tablet (10 mg total) by mouth 2 (two) times daily as needed for itching or anxiety. 11/10/18   Guilford Shi, MD  losartan (COZAAR) 100 MG  tablet Take 1 tablet (100 mg total) by mouth daily. 12/19/18 03/19/19  Isaiah Serge, NP  sertraline (ZOLOFT) 25 MG tablet Take 1 tablet (25 mg total) by mouth daily. 03/06/19   Nuala Alpha, DO  verapamil (CALAN-SR) 120 MG CR tablet Take 1 tablet (120 mg total) by mouth every morning. 01/10/19   Isaiah Serge, NP  verapamil (VERELAN PM) 360 MG 24 hr capsule Take 1 capsule (360 mg total) by mouth at bedtime. 12/05/18   Camnitz, Ocie Doyne, MD    Allergies    Patient has no known allergies.  Review of Systems   Review of Systems  Constitutional: Negative for chills, diaphoresis and fever.  Respiratory: Negative for shortness of breath.     Cardiovascular: Positive for chest pain. Negative for leg swelling.  Gastrointestinal: Negative for abdominal pain, nausea and vomiting.  Neurological: Positive for light-headedness and headaches. Negative for syncope, weakness and numbness.  All other systems reviewed and are negative.   Physical Exam Updated Vital Signs BP 131/84 (BP Location: Right Arm)   Pulse 71   Temp 97.6 F (36.4 C) (Oral)   Resp 18   Ht 5\' 9"  (1.753 m)   Wt 133.8 kg   SpO2 95%   BMI 43.56 kg/m   Physical Exam Vitals and nursing note reviewed.  Constitutional:      General: He is not in acute distress.    Appearance: He is well-developed. He is obese.  HENT:     Head: Normocephalic and atraumatic.  Eyes:     General:        Right eye: No discharge.        Left eye: No discharge.     Extraocular Movements: Extraocular movements intact.     Conjunctiva/sclera: Conjunctivae normal.  Neck:     Vascular: No JVD.     Trachea: No tracheal deviation.  Cardiovascular:     Rate and Rhythm: Normal rate and regular rhythm.     Pulses:          Radial pulses are 2+ on the right side and 2+ on the left side.       Dorsalis pedis pulses are 2+ on the right side and 2+ on the left side.       Posterior tibial pulses are 2+ on the right side and 2+ on the left side.     Comments: Homans sign absent bilaterally, no lower extremity edema, no palpable cords, compartments are soft  Pulmonary:     Effort: Pulmonary effort is normal.  Abdominal:     General: There is no distension.     Palpations: Abdomen is soft. There is no mass.     Tenderness: There is no abdominal tenderness. There is no guarding.  Musculoskeletal:     Cervical back: Normal range of motion and neck supple.     Right lower leg: No tenderness. No edema.     Left lower leg: No tenderness. No edema.  Skin:    Findings: No erythema.  Neurological:     Mental Status: He is alert.  Psychiatric:        Behavior: Behavior normal.     ED  Results / Procedures / Treatments   Labs (all labs ordered are listed, but only abnormal results are displayed) Labs Reviewed  BASIC METABOLIC PANEL - Abnormal; Notable for the following components:      Result Value   Glucose, Bld 112 (*)    All other components within normal  limits  CBC  TROPONIN I (HIGH SENSITIVITY)  TROPONIN I (HIGH SENSITIVITY)    EKG EKG Interpretation  Date/Time:  Tuesday July 15 2019 15:30:01 EST Ventricular Rate:  74 PR Interval:  200 QRS Duration: 84 QT Interval:  374 QTC Calculation: 415 R Axis:   31 Text Interpretation:  Poor data quality, interpretation may be adversely affected Normal sinus rhythm Normal ECG No significant change since last tracing Confirmed by Deno Etienne [phone]) on 07/15/2019 6:11:21 PM   Radiology DG Chest 2 View  Result Date: 07/15/2019 CLINICAL DATA:  Chest pain EXAM: CHEST - 2 VIEW COMPARISON:  11/06/2018 FINDINGS: The heart size and mediastinal contours are stable. No focal airspace consolidation, pleural effusion, or pneumothorax. The visualized skeletal structures are unremarkable. IMPRESSION: No active cardiopulmonary disease. Electronically Signed   By: Davina Poke D.O.   On: 07/15/2019 16:47    Procedures Procedures (including critical care time)  Medications Ordered in ED Medications  ketorolac (TORADOL) 30 MG/ML injection 30 mg (30 mg Intravenous Given 07/15/19 1915)    ED Course  I have reviewed the triage vital signs and the nursing notes.  Pertinent labs & imaging results that were available during my care of the patient were reviewed by me and considered in my medical decision making (see chart for details).    MDM Rules/Calculators/A&P                      Patient presenting sent from PCP for evaluation of progressively worsening chest pains.  He is afebrile, initially mildly hypertensive with improvement on subsequent reevaluations.  The pain is not pleuritic, sometimes feels as though it could  be exertional, and is not reproducible on palpation.  His EKG shows normal sinus rhythm with no significant changes from last tracing.  He had a chest pain work-up earlier last year which was reassuring.  He does have a cardiologist with whom he can follow outpatient.  He reports compliance with his medications.  Serial troponins in the ED are negative.  He had improvement in his symptoms with Toradol.  His chest x-ray shows no acute cardiopulmonary abnormalities.  The remainder of his lab work reviewed and interpreted by myself shows no leukocytosis, no anemia, no metabolic derangements, no renal insufficiency.  On reevaluation he is resting comfortably reports that he is feeling better.  He feels comfortable with discharge home with close outpatient PCP and cardiology follow-up.  At this time I doubt dissection, cardiac tamponade, esophageal rupture, pneumonia or pneumothorax.  Abdomen is soft and nontender.  Doubt acute surgical abdominal pathology.  He has no focal neurologic deficits on examination and I doubt vertebral artery dissection.  No evidence of hypertensive emergency.  He is to call his cardiologist tomorrow to schedule outpatient follow-up.  Discussed strict ED return precautions. Patient verbalized understanding of and agreement with plan and is safe for discharge home at this time.    Final Clinical Impression(s) / ED Diagnoses Final diagnoses:  Atypical chest pain    Rx / DC Orders ED Discharge Orders    None       Renita Papa, PA-C 07/16/19 [place], DO 07/16/19 1825

## 2019-07-15 NOTE — Patient Instructions (Signed)

## 2019-08-05 ENCOUNTER — Encounter: Payer: Self-pay | Admitting: Family Medicine

## 2019-08-08 ENCOUNTER — Encounter: Payer: Self-pay | Admitting: Family Medicine

## 2019-08-25 ENCOUNTER — Ambulatory Visit (INDEPENDENT_AMBULATORY_CARE_PROVIDER_SITE_OTHER): Payer: BC Managed Care – PPO | Admitting: Family Medicine

## 2019-08-25 ENCOUNTER — Other Ambulatory Visit: Payer: Self-pay

## 2019-08-25 VITALS — BP 126/70 | HR 92 | Ht 69.0 in | Wt 308.6 lb

## 2019-08-25 DIAGNOSIS — E119 Type 2 diabetes mellitus without complications: Secondary | ICD-10-CM

## 2019-08-25 DIAGNOSIS — H538 Other visual disturbances: Secondary | ICD-10-CM | POA: Diagnosis not present

## 2019-08-25 LAB — POCT GLYCOSYLATED HEMOGLOBIN (HGB A1C): HbA1c, POC (controlled diabetic range): 6.2 % (ref 0.0–7.0)

## 2019-08-25 MED ORDER — SERTRALINE HCL 25 MG PO TABS: 25.0000 mg | ORAL_TABLET | Freq: Every day | ORAL | 2 refills | Status: DC

## 2019-08-25 NOTE — Progress Notes (Signed)
    SUBJECTIVE:   CHIEF COMPLAINT / HPI:   [place] Patient states he is having blurry vision. No headaches and describes his vision as sometimes seeing double and everything as hazy. It started out as intermittent but now he states it is becoming more frequent. He had previously been to the ED for this 6 months ago was seen by Neurology and had a brain MRI which was negative. No pain on eye movement. BP well controlled in office today.  PERTINENT  PMH / PSH: HTN, DM  OBJECTIVE:   BP 126/70   Pulse 92   Wt (!) 308 lb 9.6 oz (140 kg)   SpO2 98%   BMI 45.57 kg/m   Gen: Alert and Oriented x 3, NAD HEENT: Normocephalic, atraumatic, PERRLA, EOMI, fundoscopic exam attempted but I could not find optic disc CV: RRR, no murmurs, normal S1, S2 split Resp: CTAB, no wheezing, rales, or rhonchi, comfortable work of breathing Skin: warm, dry, intact, no rashes  ASSESSMENT/PLAN:   Blurry vision, bilateral Unclear etiology. Patient has had extensive workup for cardiac and pulmonary concerns with nothing that would explain his previous symptoms. - Referral to ophthalmology for diabetic eye exam - Referral to optometrist for evaluation of vision to see if he needs corrective lenses     Nuala Alpha, [place]

## 2019-08-25 NOTE — Patient Instructions (Addendum)
It was great to see you today! Thank you for letting me participate in your care!  Today, we discussed your blurry vision and headache. I am glad your blood pressure is well controlled, please continue with your prescription medications to help lower your blood pressure.  I have referred you to an opthalmologist for a diabetic eye exam. I also recommend you use an optometrist to see if you need corrective lenses.  I have refilled your Sertraline. Please follow up with me in 3 months.  Be well, Harolyn Rutherford, DO PGY-3, [facility]

## 2019-08-26 DIAGNOSIS — H538 Other visual disturbances: Secondary | ICD-10-CM | POA: Insufficient documentation

## 2019-08-26 HISTORY — DX: Other visual disturbances: H53.8

## 2019-08-26 NOTE — Assessment & Plan Note (Signed)
Unclear etiology. Patient has had extensive workup for cardiac and pulmonary concerns with nothing that would explain his previous symptoms. - Referral to ophthalmology for diabetic eye exam - Referral to optometrist for evaluation of vision to see if he needs corrective lenses

## 2019-09-09 ENCOUNTER — Encounter: Payer: Self-pay | Admitting: Family Medicine

## 2019-09-10 ENCOUNTER — Other Ambulatory Visit: Payer: Self-pay | Admitting: Family Medicine

## 2019-09-10 NOTE — Progress Notes (Signed)
Patient seen and evaluated by ophthalmology for concerns of blurring vision/worsening vision. No diabetic retinopathy on exam. Patient is requesting note for work that he can return without restrictions.

## 2019-12-18 ENCOUNTER — Encounter: Payer: Self-pay | Admitting: Family Medicine

## 2019-12-24 ENCOUNTER — Encounter: Payer: Self-pay | Admitting: Student in an Organized Health Care Education/Training Program

## 2019-12-24 ENCOUNTER — Other Ambulatory Visit: Payer: Self-pay

## 2019-12-24 ENCOUNTER — Ambulatory Visit (INDEPENDENT_AMBULATORY_CARE_PROVIDER_SITE_OTHER): Payer: BC Managed Care – PPO | Admitting: Student in an Organized Health Care Education/Training Program

## 2019-12-24 VITALS — BP 125/80 | HR 107 | Ht 69.0 in | Wt 298.8 lb

## 2019-12-24 DIAGNOSIS — M25561 Pain in right knee: Secondary | ICD-10-CM | POA: Diagnosis not present

## 2019-12-24 DIAGNOSIS — M25562 Pain in left knee: Secondary | ICD-10-CM | POA: Diagnosis not present

## 2019-12-24 NOTE — Patient Instructions (Signed)
It was a pleasure to see you today!  To summarize our discussion for this visit:  Started hear that your knee is in pain.  It sounds like you could have a meniscus injury.  The first step I would recommend is getting an x-ray of your knee as well as continuing your over-the-counter pain medications that you have been taking.  You can go to [facility] imaging at [employer] to complete your x-ray and we will follow up with the results.  Physical therapy could also benefit you with this type of injury so I have sent in a referral.  Some additional health maintenance measures we should update are: Health Maintenance Due  Topic Date Due   Hepatitis C Screening  Never done   PNEUMOCOCCAL POLYSACCHARIDE VACCINE AGE 65-64 HIGH RISK  Never done   FOOT EXAM  Never done   OPHTHALMOLOGY EXAM  Never done   URINE MICROALBUMIN  Never done   COVID-19 Vaccine (1) Never done   COLONOSCOPY  Never done      Call the clinic at [phone] if your symptoms worsen or you have any concerns.   Thank you for allowing me to take part in your care,  Dr. Doristine Mango

## 2019-12-24 NOTE — Progress Notes (Signed)
    SUBJECTIVE:   CHIEF COMPLAINT / HPI: R knee pain  Last Wednesday was at work and started having R knee pain. Did not have any injury. Pain creeped up on him. Feels like its on the inside of the knee. Persistent. Feels like its going to give out and did one time but caught himself. No catching or popping.  Relief with laying down or sitting without any weight on it. Putting weight on it makes it hurt. Severity 9 or 10 when bearing weight. It's 5 or 6 when resting. Sometimes the pain shoots up to his shoulders.  Icing, braces, tylenol, advil, motrin, volteran gel. Cannot stand and put weight on it. In tears from walking to [employer].  No rashes. No swelling. No fevers.      Objective:  BP 125/80   Pulse (!) 107   Ht 5\' 9"  (1.753 m)   Wt (!) 298 lb 12.8 oz (135.5 kg)   SpO2 97%   BMI 44.13 kg/m   Physical Exam: Gen: NAD, comfortable in exam room Knee  Observation:normal appearance. Algesic gait.  Palpation: tenderness to palpation of medial joint line. No effusion appreciated ROM: limited in flexion and extension due to pain Strength: full Sensation: intact Special tests: positive thessaley L knee. Negative valgus, varus, anterior or posterior drawer. Neg mcmurry.  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 - knee xray to evaluate arthritis and r/o fx - tramadol x5 days (discussed with Dr. Owens Shark) - continue to ice, rest as needed - refer to physical therapy     Richarda Osmond, [place]

## 2019-12-25 ENCOUNTER — Encounter: Payer: Self-pay | Admitting: Family Medicine

## 2019-12-25 MED ORDER — TRAMADOL HCL 50 MG PO TABS: 50.0000 mg | ORAL_TABLET | Freq: Three times a day (TID) | ORAL | 0 refills | Status: AC | PRN

## 2019-12-29 DIAGNOSIS — M25562 Pain in left knee: Secondary | ICD-10-CM | POA: Insufficient documentation

## 2019-12-29 DIAGNOSIS — M25561 Pain in right knee: Secondary | ICD-10-CM | POA: Insufficient documentation

## 2019-12-29 NOTE — Assessment & Plan Note (Signed)
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 - knee xray to evaluate arthritis and r/o fx - tramadol x5 days (discussed with Dr. Owens Shark) - continue to ice, rest as needed - refer to physical therapy

## 2020-01-26 ENCOUNTER — Telehealth: Payer: Self-pay | Admitting: Family Medicine

## 2020-01-26 NOTE — Telephone Encounter (Signed)
Received FMLA paperwork for recent time missed due to knee pain.  Call patient to verify specific dates to complete paperwork.  During conversation he also states that he has had some tingling in his left forearm and tingling on the side of his left leg that come and go for the past several days.  Last about a minute and go away.  Otherwise feeling at baseline.  No associated chest pain, shortness of breath, lightheadedness/dizziness, or weakness.  Recommended scheduling appointment within our clinic at convenience or evaluation sooner (clinic vs ED) if sensation becomes persistent or associated with any of the above.  Will complete paperwork and fax out tomorrow with dates including 7/23 through 8/2.  Patriciaann Clan, DO

## 2020-02-04 ENCOUNTER — Encounter: Payer: Self-pay | Admitting: Family Medicine

## 2020-03-01 ENCOUNTER — Other Ambulatory Visit: Payer: Self-pay

## 2020-03-01 ENCOUNTER — Ambulatory Visit (HOSPITAL_COMMUNITY): Payer: BC Managed Care – PPO | Attending: Cardiovascular Disease

## 2020-03-01 ENCOUNTER — Telehealth: Payer: Self-pay

## 2020-03-01 DIAGNOSIS — I77810 Thoracic aortic ectasia: Secondary | ICD-10-CM

## 2020-03-01 DIAGNOSIS — R0600 Dyspnea, unspecified: Secondary | ICD-10-CM | POA: Insufficient documentation

## 2020-03-01 LAB — ECHOCARDIOGRAM COMPLETE
Area-P 1/2: 4.26 cm2
P 1/2 time: 237 msec
S' Lateral: 3.2 cm

## 2020-03-01 NOTE — Telephone Encounter (Signed)
The patient has been notified of the result and verbalized understanding.  All questions (if any) were answered. Michaelyn Barter, RN 03/01/2020 4:46 PM   Will forward copy to patient's PCP. Will order CT for patient to have in one year.

## 2020-03-01 NOTE — Telephone Encounter (Signed)
-----   Message from Josue Hector, MD sent at 03/01/2020  1:12 PM EDT ----- Echo shows good EF and no valve disease Some aortic root dilatation Had CT in June 2020 and only measures 4.0 in sinus and 3.6 in root would do f/u CTA chest in a year

## 2020-03-11 ENCOUNTER — Encounter: Payer: Self-pay | Admitting: Family Medicine

## 2020-03-11 ENCOUNTER — Other Ambulatory Visit: Payer: Self-pay

## 2020-03-11 ENCOUNTER — Ambulatory Visit (INDEPENDENT_AMBULATORY_CARE_PROVIDER_SITE_OTHER): Payer: BC Managed Care – PPO | Admitting: Family Medicine

## 2020-03-11 VITALS — BP 165/90 | HR 100 | Wt 303.4 lb

## 2020-03-11 DIAGNOSIS — R000 Tachycardia, unspecified: Secondary | ICD-10-CM

## 2020-03-11 DIAGNOSIS — Z23 Encounter for immunization: Secondary | ICD-10-CM | POA: Diagnosis not present

## 2020-03-11 DIAGNOSIS — I10 Essential (primary) hypertension: Secondary | ICD-10-CM

## 2020-03-11 DIAGNOSIS — Z0000 Encounter for general adult medical examination without abnormal findings: Secondary | ICD-10-CM | POA: Insufficient documentation

## 2020-03-11 DIAGNOSIS — Z1159 Encounter for screening for other viral diseases: Secondary | ICD-10-CM

## 2020-03-11 DIAGNOSIS — M25561 Pain in right knee: Secondary | ICD-10-CM

## 2020-03-11 DIAGNOSIS — M25562 Pain in left knee: Secondary | ICD-10-CM | POA: Diagnosis not present

## 2020-03-11 DIAGNOSIS — G4733 Obstructive sleep apnea (adult) (pediatric): Secondary | ICD-10-CM

## 2020-03-11 DIAGNOSIS — E119 Type 2 diabetes mellitus without complications: Secondary | ICD-10-CM | POA: Diagnosis not present

## 2020-03-11 LAB — POCT GLYCOSYLATED HEMOGLOBIN (HGB A1C): HbA1c, POC (controlled diabetic range): 6.1 % (ref 0.0–7.0)

## 2020-03-11 MED ORDER — LOSARTAN POTASSIUM 50 MG PO TABS: 50.0000 mg | ORAL_TABLET | Freq: Every day | ORAL | 3 refills | Status: DC

## 2020-03-11 MED ORDER — MELOXICAM 7.5 MG PO TABS: 7.5000 mg | ORAL_TABLET | Freq: Every day | ORAL | 0 refills | Status: DC

## 2020-03-11 NOTE — Assessment & Plan Note (Addendum)
Poorly controlled, stage II 165/90 today and not been on antihypertensives.  Asymptomatic.  Will restart losartan 50 mg and check BMP today, likely will need an increase in the future.  Check BP at home 2-3 times weekly and keep journal for follow-up visit, f/u in 1 week for lab BMP.

## 2020-03-11 NOTE — Progress Notes (Signed)
SUBJECTIVE:   CHIEF COMPLAINT / HPI: Knee issues still  Mr. Rodney Lane is a 49 year old gentleman presenting discussed the following:  Knee pain: R sided. Has been intermittently going on since 11/2019.  He saw Dr. Ouida Sills on 7/28 for this concern and felt it was likely degenerative meniscal related.  Reported a slow insidious pain that folic it was inside of his knee, finds relief with laying down or not putting any weight on it.  Referred to PT but stopped having issues so didn't start. Now he feels like the pain is better with work while wearing the sleeve and now worse at home without the sleeve on. Can feel it "inside" his knee, feels throbbing and sharp on occasion. Medial. Using voltaren gel, aleve, tylenol. Denies any swelling, numbness, tingling, locking, or clicking. Pop noise once or twice.  Able to walk, feels like the pain has made his leg feel weak on occasion.  He does not want any knee injections.  Diabetes: Diet controlled.  Last A1c 6.2 in 07/2019.  He does not check his sugar at home.  Hypertension: Previously on HCTZ and losartan, however prescriptions ran out in may or June and thought that meant his regimen was complete.  He has checked his blood pressure at home a few times with his home cuff, says it has either been "high "or in the 188 systolic.  Denies any palpitations, chest pain, shortness of breath, headaches associated with this.  Health maintenance: Hepatitis C screening, pneumonia vaccine,  foot exam, urine microalbumin, colonoscopy   PERTINENT  PMH / PSH: Type 2 diabetes, OSA, hypertension, left knee pain, chest pain  OBJECTIVE:   BP (!) 165/90   Pulse 100   Wt (!) 303 lb 6.4 oz (137.6 kg)   SpO2 97%   BMI 44.80 kg/m   General: Alert, NAD HEENT: NCAT, MMM Cardiac: Tachycardic with regular rhythm, no murmur appreciated Lungs: Clear bilaterally, no increased WOB on room air Ext: Warm, dry, 2+ distal pulses, no edema  Knee: - Inspection: no gross  deformity. No swelling/effusion, erythema or bruising. Skin intact. - Palpation: no TTP to all joint lines, can palpate crepitus/extra tissue within joint space - ROM: full active ROM with flexion and extension in knee and hip - Strength: 5/5 strength bilaterally - Neuro/vasc: NV intact - Special Tests: - LIGAMENTS: negative anterior and posterior drawer, negative Lachman's, no MCL or LCL laxity  -- MENISCUS: Positive Thessaly's, specifically with medial rotation on the right knee -- PF JOINT: nml patellar mobility bilaterally.  negative patellar grind --Able to walk without concern  ASSESSMENT/PLAN:   HTN (hypertension) Poorly controlled, stage II 165/90 today and not been on antihypertensives.  Asymptomatic.  Will restart losartan 50 mg and check BMP today, likely will need an increase in the future.  Check BP at home 2-3 times weekly and keep journal for follow-up visit, f/u in 1 week for lab BMP.  Diabetes mellitus without complication (HCC) Well-controlled, A1c 6.1 today.  Diet controlled.  Medial knee pain, right Now chronic, unchanged.  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  Provided with meniscal quadriceps strengthening exercises as PT is difficult for him to go to due to third shift.  Can still proceed with knee XR per Dr. Ouida Sills.  Rx'd short course of meloxicam, still use ice/heat, intermittent bracing, and Tylenol PRN.   OSA (obstructive sleep apnea) Compliant with CPAP  Sinus tachycardia Chronic, stable.  Asymptomatic, HR 100 today.  May consider adding back on diltiazem/verapamil as he has been on in the past.  Healthcare maintenance Given flu shot.  Will check HIV and hepatitis C screening in addition to lipid panel today given elevated BMI 44.    Follow-up in 1 week for lab visit, otherwise in 1 month with myself.  Sooner if needed  if BP consistently over 140/90 or knee pain more severe.  Rodney Lane, [place]

## 2020-03-11 NOTE — Patient Instructions (Addendum)
It was wonderful to see you today.  For your knee pain: I still think is a good idea to go ahead and get his x-rays with Dr. Ouida Sills ordered.  I would also call the physical therapy office to see if you can get established with them as well as this will help strengthen the muscles around your knee to align it appropriately.  You can continue to use Voltaren gel, ice/heat, Tylenol up to 4000 mg a day maximum  For your blood pressure: We are going to restart losartan every day take your blood pressure under control.  I would like you to check your blood pressure 2-3 times weekly when you are relaxed in sitting for several minutes.  Keep a journal of this to bring into your follow-up visit.  After you return for labs in 1 week and if your blood pressure is still over 140/90 consistently at that time, please let me know we can discuss increasing this medication to 100 mg daily if you are tolerating it well.  Follow-up for lab visit in 1 week, then with myself in 1 month or sooner if needed.

## 2020-03-11 NOTE — Assessment & Plan Note (Signed)
Now chronic, unchanged.  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  Provided with meniscal quadriceps strengthening exercises as PT is difficult for him to go to due to third shift.  Can still proceed with knee XR per Dr. Ouida Sills.  Rx'd short course of meloxicam, still use ice/heat, intermittent bracing, and Tylenol PRN.

## 2020-03-11 NOTE — Assessment & Plan Note (Signed)
Chronic, stable.  Asymptomatic, HR 100 today.  May consider adding back on diltiazem/verapamil as he has been on in the past.

## 2020-03-11 NOTE — Assessment & Plan Note (Signed)
Well-controlled, A1c 6.1 today.  Diet controlled.

## 2020-03-11 NOTE — Assessment & Plan Note (Signed)
Given flu shot.  Will check HIV and hepatitis C screening in addition to lipid panel today given elevated BMI 44.

## 2020-03-11 NOTE — Assessment & Plan Note (Signed)
Compliant with CPAP 

## 2020-03-12 ENCOUNTER — Encounter: Payer: Self-pay | Admitting: Family Medicine

## 2020-03-12 LAB — HCV AB W REFLEX TO QUANT PCR: HCV Ab: <0.1 s/co ratio (ref 0.0–0.9)

## 2020-03-12 LAB — BASIC METABOLIC PANEL
BUN/Creatinine Ratio: 15 (ref 9–20)
BUN: 14 mg/dL (ref 6–24)
CO2: 24 mmol/L (ref 20–29)
Calcium: 9.7 mg/dL (ref 8.7–10.2)
Chloride: 102 mmol/L (ref 96–106)
Creatinine, Ser: 0.93 mg/dL (ref 0.76–1.27)
GFR calc Af Amer: 110 mL/min/{1.73_m2} (ref >59)
GFR calc non Af Amer: 95 mL/min/{1.73_m2} (ref >59)
Glucose: 118 mg/dL — ABNORMAL HIGH (ref 65–99)
Potassium: 3.9 mmol/L (ref 3.5–5.2)
Sodium: 140 mmol/L (ref 134–144)

## 2020-03-12 LAB — LIPID PANEL
Chol/HDL Ratio: 5.7 ratio — ABNORMAL HIGH (ref 0.0–5.0)
Cholesterol, Total: 212 mg/dL — ABNORMAL HIGH (ref 100–199)
HDL: 37 mg/dL — ABNORMAL LOW (ref >39)
LDL Chol Calc (NIH): 99 mg/dL (ref 0–99)
Triglycerides: 452 mg/dL — ABNORMAL HIGH (ref 0–149)
VLDL Cholesterol Cal: 76 mg/dL — ABNORMAL HIGH (ref 5–40)

## 2020-03-12 LAB — HCV INTERPRETATION

## 2020-03-15 ENCOUNTER — Ambulatory Visit
Admission: RE | Admit: 2020-03-15 | Discharge: 2020-03-15 | Disposition: A | Discharge status: Home / Self Care | Payer: BC Managed Care – PPO | Source: Ambulatory Visit | Attending: Family Medicine | Admitting: Family Medicine

## 2020-03-15 DIAGNOSIS — M25561 Pain in right knee: Secondary | ICD-10-CM

## 2020-03-15 IMAGING — CR DG KNEE 3 VIEWS*R*
3 series · 3 of 3 positions shown · non-contrast
Comparison: None.

CLINICAL DATA: Knee pain for several months, no known injury,
initial encounter

EXAM:
RIGHT KNEE - 3 VIEW

[w knee ap right (1 of 2)]
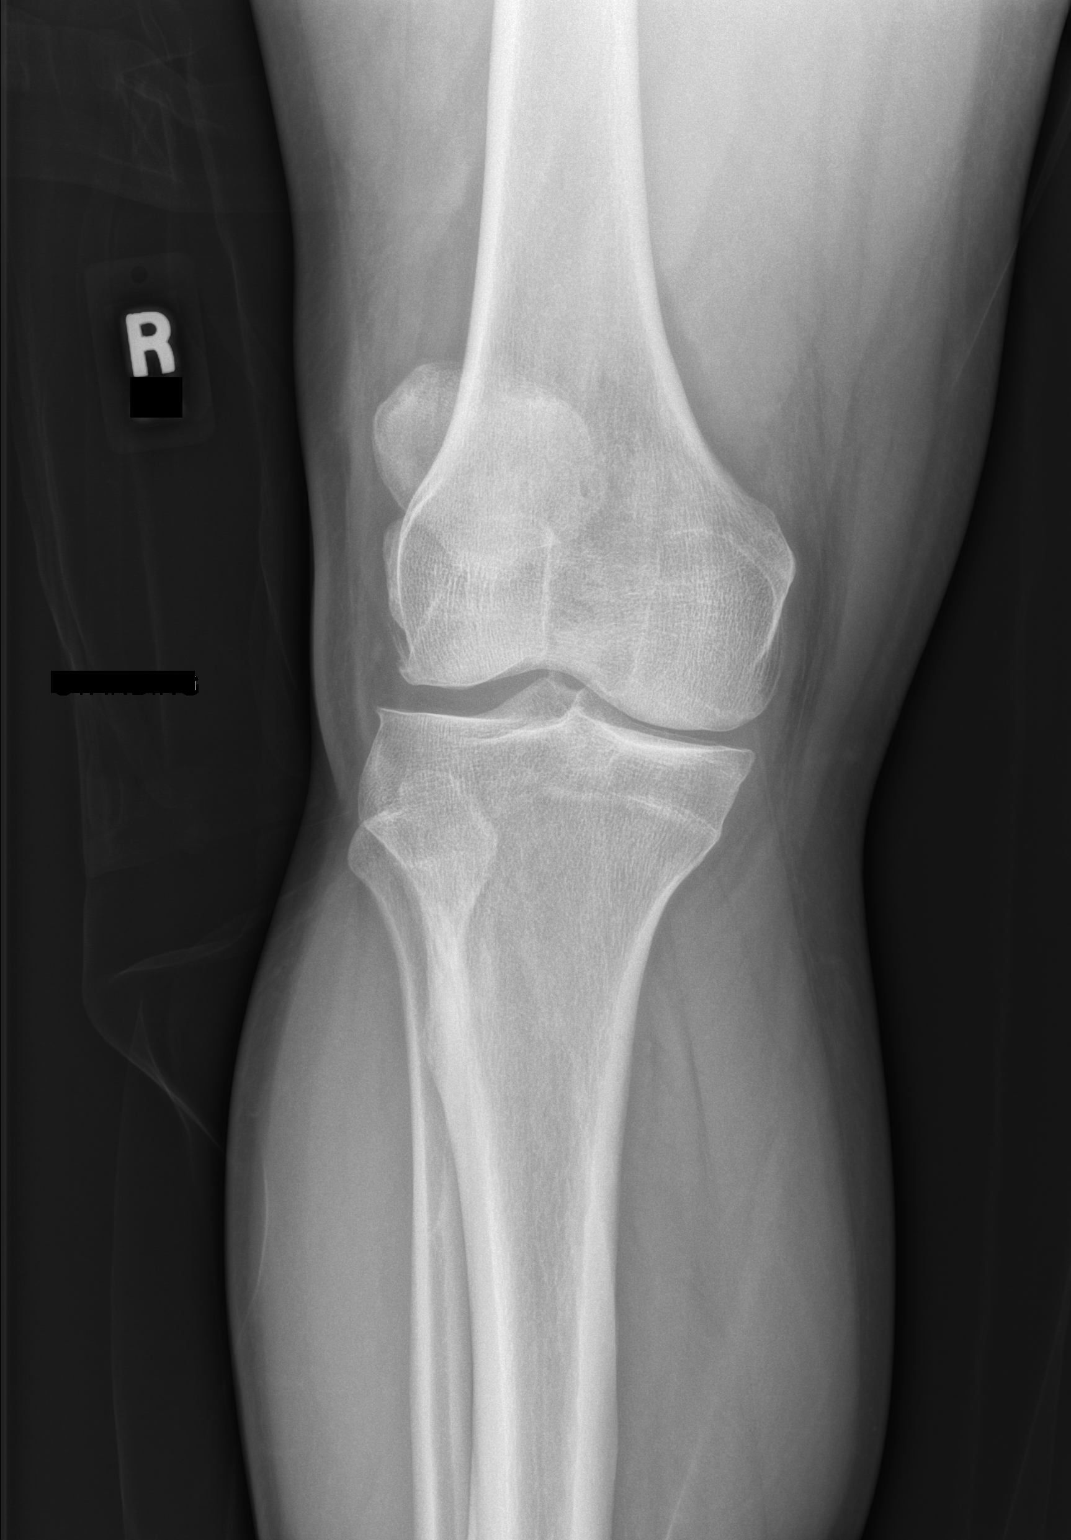

[w knee ap right (2 of 2)]
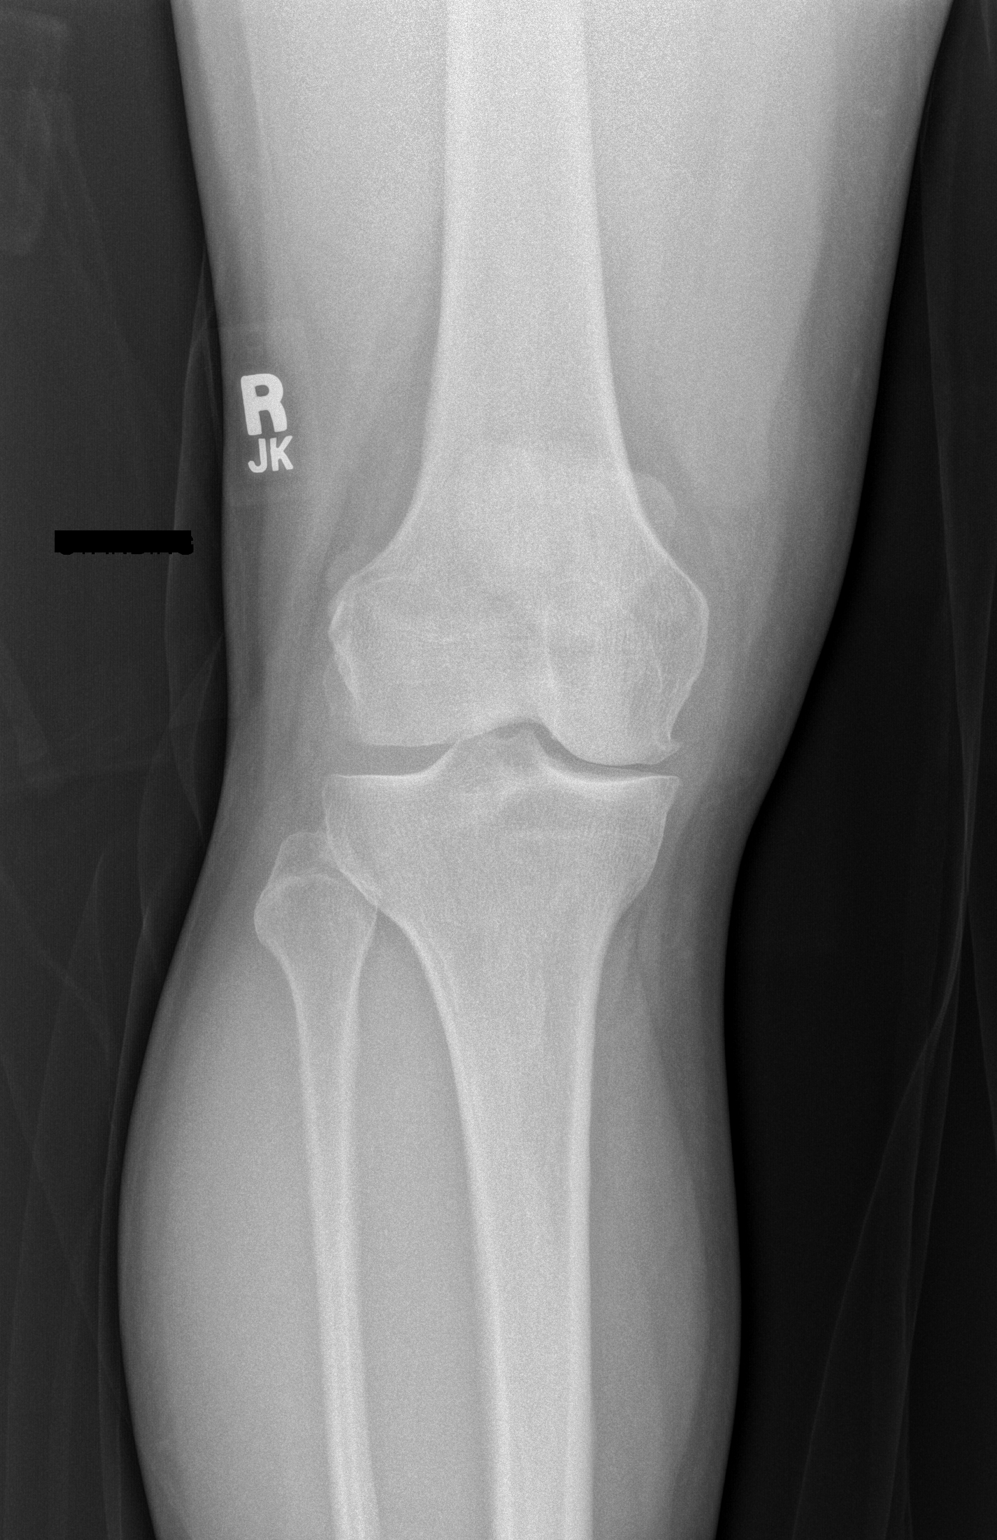

[w knee lat right]
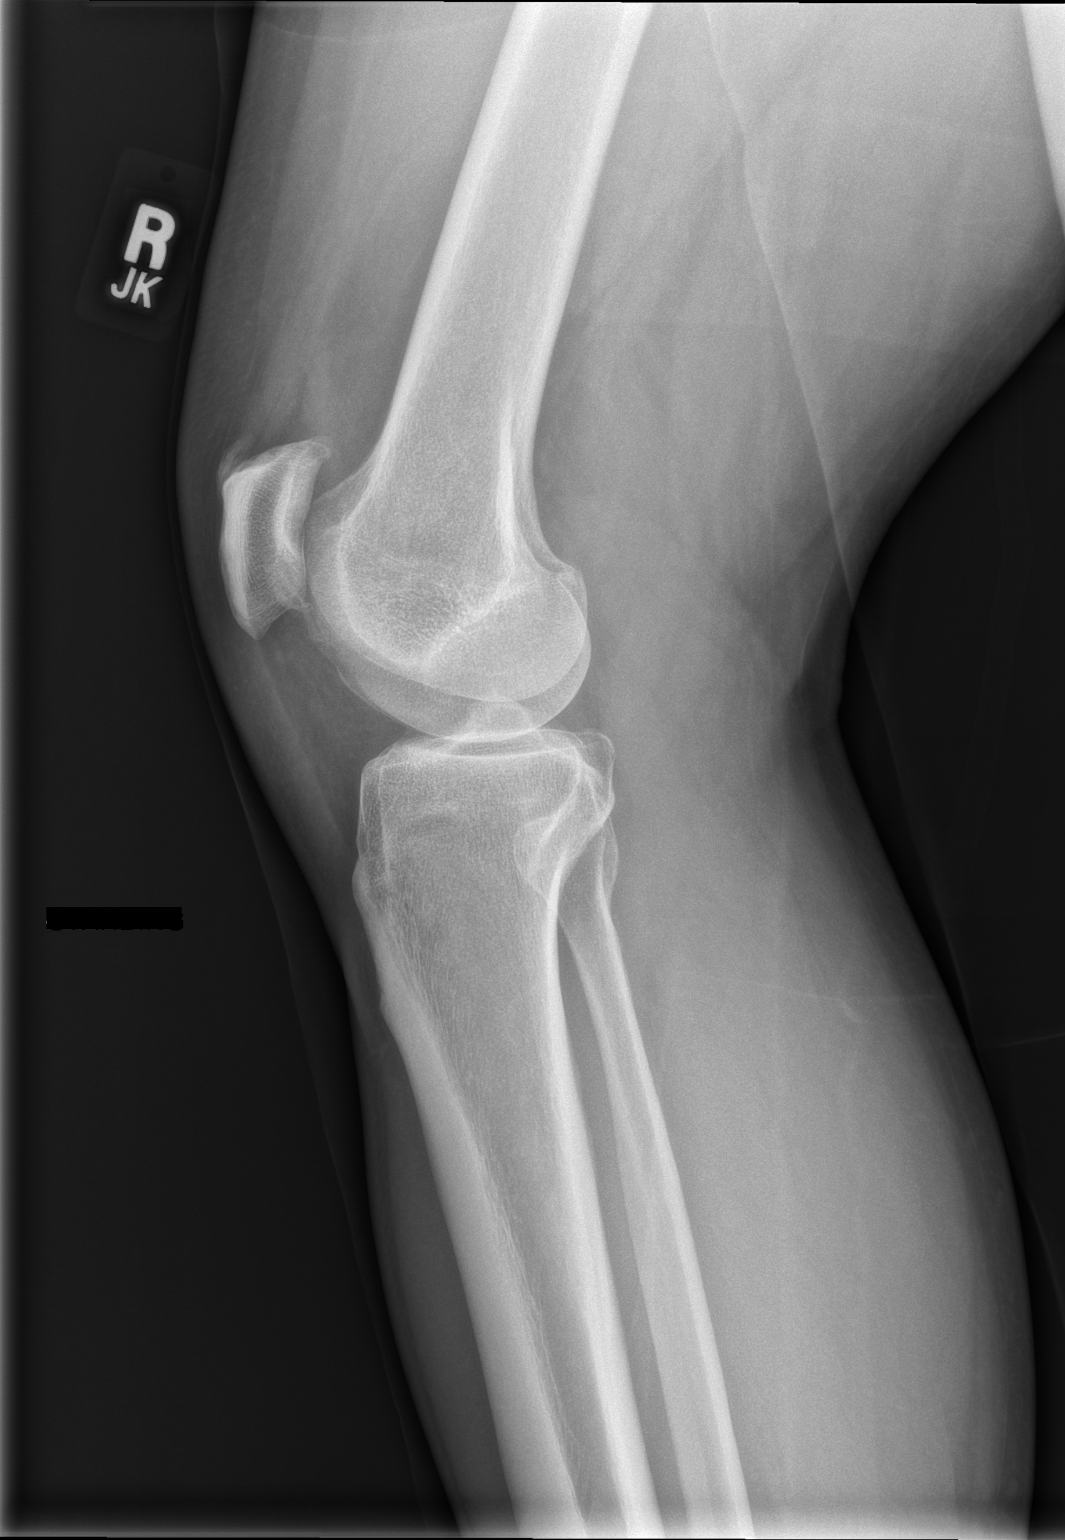

[3 of 3 positions shown; findings below may reference images not displayed]

FINDINGS: Medial joint space narrowing is noted. Patellofemoral degenerative
changes are noted as well. No joint effusion is seen. No acute
fracture or dislocation is noted.
IMPRESSION: Degenerative change without acute abnormality.

## 2020-03-15 IMAGING — CR DG KNEE STANDING AP BILAT
1 series · 1 of 1 positions shown · non-contrast
Comparison: None.

CLINICAL DATA: Chronic knee pain for several months, no known
injury, initial encounter

EXAM:
BILATERAL KNEES STANDING - 1 VIEW

[w knees ap bilat]
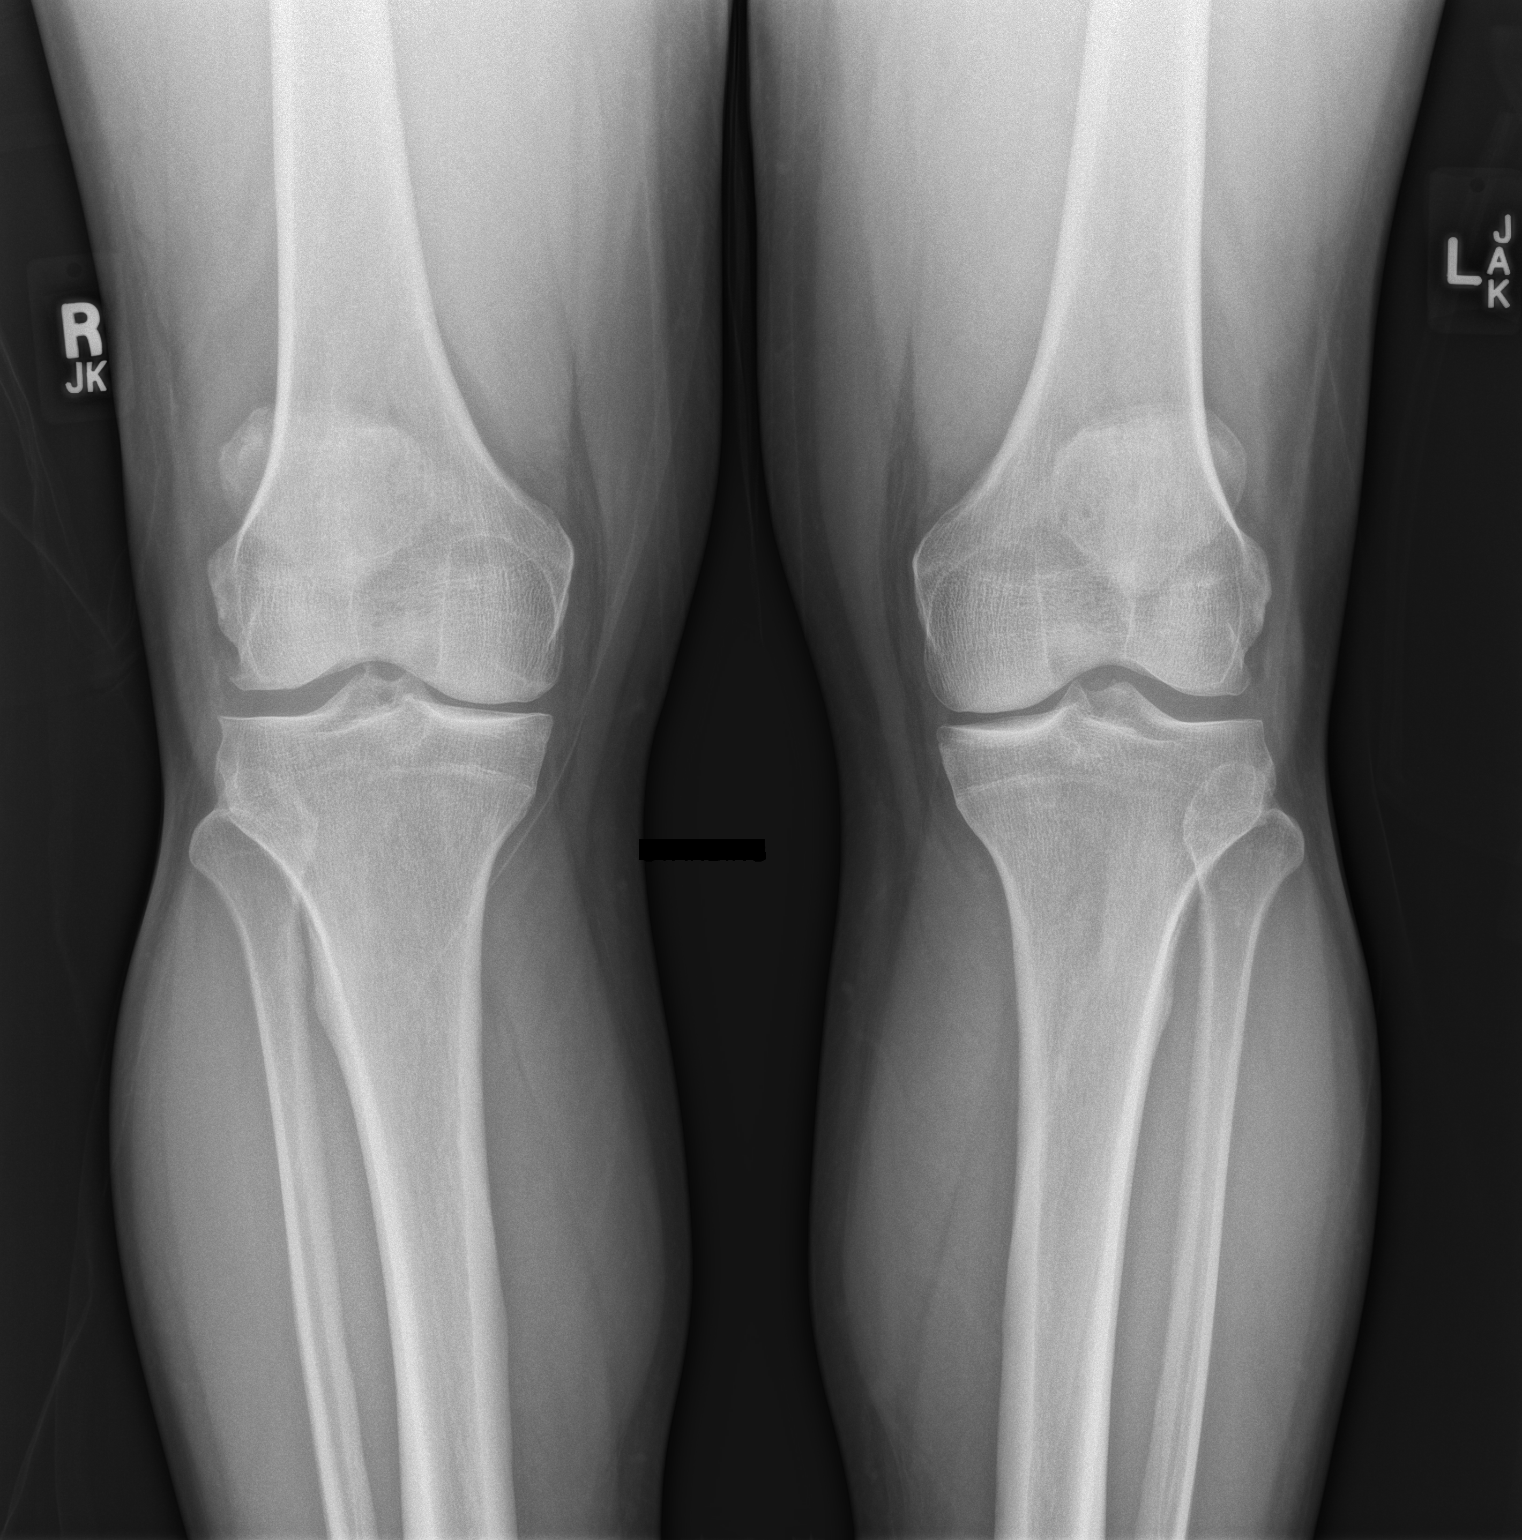

[1 of 1 positions shown; findings below may reference images not displayed]

FINDINGS: Single frontal view of the knees while standing demonstrates mild
medial joint space narrowing bilaterally. No acute fracture or
dislocation is seen.
IMPRESSION: Mild degenerative change without acute abnormality.

## 2020-03-16 ENCOUNTER — Encounter: Payer: Self-pay | Admitting: Family Medicine

## 2020-03-22 ENCOUNTER — Telehealth: Payer: Self-pay | Admitting: Family Medicine

## 2020-03-22 NOTE — Telephone Encounter (Signed)
Call patient in reference to my chart messages.  BP continually elevated and having recurrent chest pain similar to previous episodes last year.  When he was having frequent chest pain episodes last year he ended up being evaluated by cardiology and pulmonology.  He had a normal Lexiscan Myoview in 11/2018 and additionally had an recent echo on 03/01/2020 through cardiology which was normal (mild aortic root dilatation that they will be getting a CTA in 1 year for).  His evaluation from pulmonology revealed mild OSA now on CPAP and mild obstruction on PFTs.  Both of which felt no etiology for his chest pain and thus was treated for anxiety.  Felt like his chest pain had done much better until shortly after our appointment on 10/14.  He says it is sharp in nature and happens whenever he is sitting down or when he is doing activities.  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   Scheduled for the next available appointment in our clinic on 10/28 at 9:10 AM with Dr. Jerilynn Mages.  Will need an updated EKG and BMP during this visit.  Should also consider restarting verapamil for further hypertension/tachycardia management (did not tolerate BB in the past), +/- antianxiety medications.  Recommended calling his cardiologist today and scheduling follow-up appointment.   ED precautions discussed especially if chest pain becomes more severe or persistent.  Discussed keeping him out of work through Thursday's appointment and likely to extend.  Patriciaann Clan, DO

## 2020-03-25 ENCOUNTER — Ambulatory Visit (INDEPENDENT_AMBULATORY_CARE_PROVIDER_SITE_OTHER): Payer: BC Managed Care – PPO | Admitting: Family Medicine

## 2020-03-25 ENCOUNTER — Other Ambulatory Visit: Payer: Self-pay

## 2020-03-25 ENCOUNTER — Ambulatory Visit (HOSPITAL_COMMUNITY)
Admission: RE | Admit: 2020-03-25 | Discharge: 2020-03-25 | Disposition: A | Discharge status: Home / Self Care | Payer: BC Managed Care – PPO | Source: Ambulatory Visit | Attending: Family Medicine | Admitting: Family Medicine

## 2020-03-25 VITALS — BP 165/99 | HR 119 | Ht 69.0 in | Wt 306.8 lb

## 2020-03-25 DIAGNOSIS — R079 Chest pain, unspecified: Secondary | ICD-10-CM | POA: Diagnosis present

## 2020-03-25 DIAGNOSIS — F419 Anxiety disorder, unspecified: Secondary | ICD-10-CM

## 2020-03-25 DIAGNOSIS — I10 Essential (primary) hypertension: Secondary | ICD-10-CM | POA: Diagnosis not present

## 2020-03-25 DIAGNOSIS — R000 Tachycardia, unspecified: Secondary | ICD-10-CM | POA: Diagnosis not present

## 2020-03-25 MED ORDER — VERAPAMIL HCL ER 120 MG PO TBCR: 120.0000 mg | EXTENDED_RELEASE_TABLET | Freq: Every day | ORAL | 1 refills | Status: DC

## 2020-03-25 MED ORDER — SERTRALINE HCL 25 MG PO TABS: 25.0000 mg | ORAL_TABLET | Freq: Every day | ORAL | 1 refills | Status: DC

## 2020-03-25 NOTE — Assessment & Plan Note (Signed)
Negative EKG in the office.  Patient has very atypical chest pain.  Suspect that there may be a component of anxiety contributing to his symptoms given his previously negative cardiac and pulmonary work-up.  He was offered a cath last year, but declined at that time.  It may be that he would benefit from that at this time.  Advised that he should follow up with cardiology.  Also restarted him on Zoloft 25 mg as he was on this previously and when his anxiety was well controlled his chest pain was as well.  He did not have any hypoxia with ambulation today in clinic which is also reassuring.  He has been intermittently tachycardic, but seems to have had similar tachycardia prior to being initiated on verapamil by his cardiologist.  We will also restart him on verapamil at low-dose of 120 mg and titrate up as needed.  Hopefully he can get back in with cardiology for further titration.  We will have him follow-up on 11/9 with his PCP.  He was written out of work until then due to his pain and concerned that he will have worsening of his symptoms at work and to allow him for time for follow-up appointments.

## 2020-03-25 NOTE — Patient Instructions (Signed)
Thank you for coming to see me today. It was a pleasure. Today we talked about:   We will get some labs today.  If they are abnormal or we need to do something about them, I will call you.  If they are normal, I will send you a message on MyChart (if it is active) or a letter in the mail.  If you don't hear from [place] in 2 weeks, please call the office at the number below.  Continue to take losartan 100 mg daily.  We will also restart your verapamil at a low dose.  This will likely need to be titrated up, but we will start low and slow.  We will also restart your Zoloft at the dose that you are on previously.  It is very important that you call your cardiologist to make an appointment as soon as possible for follow-up.  Please follow-up with PCP in 1-2 weeks.  If you have any questions or concerns, please do not hesitate to call the office at [phone].  Best,   [place] Constable, DO

## 2020-03-25 NOTE — Assessment & Plan Note (Signed)
BP continues to be elevated.  He has been taking losartan 100 mg daily.  We will check a BMP today.  We will also restart verapamil at low-dose of 120 mg.  Have him follow-up in 1-1/2 weeks with his PCP on 11/9.

## 2020-03-25 NOTE — Progress Notes (Signed)
SUBJECTIVE:   CHIEF COMPLAINT / HPI:   Chest Pain  3 days ago, patient spoke with his PCP and had reported that his blood pressure was continually elevated that he was having recurrent chest pain similar to previous episodes he has had the year before At that point he had been evaluated by cardiology and pulmonology, had a normal Lexiscan Myoview in July 2020 and echo on 03/01/2020 that was normal Catheterization had been previously discussed, but patient had opted to not proceed with this at that time Pulmonology evaluation showed mild OSA now on CPAP and mild obstruction on PFTs He was treated for anxiety as this was thought to be the cause of his pain The chest pain had done much better until after his appointment with PCP on 10/14  Pain feels the same as last year Feels like the pain is like someone is squeezing his heart Feels the pain about once a day Hasn't been able to work because of the pain Occasionally gets a sharp pain at the start of his pain episodes Felt pain once when asleep, others is mostly when up Pain will occur when sitting or with exertion Doesn't happen every time he exerts himself Soaking in the tub helps his pain Doesn't report much difficulty breathing Overall, feels run down, has no energy  Has previously been on verapamil, but stopped taking it a few months ago when he ran out He has not yet gotten an appointment with his cardiologist  Thinks his anxiety and stress is contributing to his pain Has been a few weeks since he had an anxiety attack Zoloft also ran out months ago and hasn't been taking it  HTN Current regimen: Losartan 50 mg daily, but increased to 100mg  Was seen by PCP on 10/14 when he was noted to have been without his blood pressure medications since May or June At that time he had been restarted on losartan 50 mg daily Highest BP was 173/113 at home  PERTINENT  PMH / PSH: T2DM, OSA, HTN, left knee pain, chest pain,  anxiety  OBJECTIVE:   BP (!) 165/99   Pulse (!) 119   Ht 5\' 9"  (1.753 m)   Wt (!) 306 lb 12.8 oz (139.2 kg)   SpO2 97%   BMI 45.31 kg/m    Physical Exam:  General: 49 y.o. male in NAD Cardio: RRR no m/r/g, HR 90s on my exam Lungs: CTAB, no wheezing, no rhonchi, no crackles, no IWOB on RA Skin: warm and dry Extremities: No edema Psych: Mood and affect appropriate for circumstance, thought process linear logical, no SI  Ambulated patient in clinic with pulse ox attached, O2 sats 97 to 98% on room air, heart rate 100-119  EKG: Rate 98 bpm, NSR, no acute ST changes  ASSESSMENT/PLAN:   Chest pain Negative EKG in the office.  Patient has very atypical chest pain.  Suspect that there may be a component of anxiety contributing to his symptoms given his previously negative cardiac and pulmonary work-up.  He was offered a cath last year, but declined at that time.  It may be that he would benefit from that at this time.  Advised that he should follow up with cardiology.  Also restarted him on Zoloft 25 mg as he was on this previously and when his anxiety was well controlled his chest pain was as well.  He did not have any hypoxia with ambulation today in clinic which is also reassuring.  He has been intermittently  tachycardic, but seems to have had similar tachycardia prior to being initiated on verapamil by his cardiologist.  We will also restart him on verapamil at low-dose of 120 mg and titrate up as needed.  Hopefully he can get back in with cardiology for further titration.  We will have him follow-up on 11/9 with his PCP.  He was written out of work until then due to his pain and concerned that he will have worsening of his symptoms at work and to allow him for time for follow-up appointments.  Sinus tachycardia He is tachycardic on examination today, EKG has heart rate of 98, ambulating in the clinic he was 100-119.  We will restart him on verapamil as he was on this previously and  discontinued it himself by not having refills.  We will restart him on 120 mg daily and titrate up as needed.  Advised to follow-up with cardiology, hopefully before he sees Dr. Higinio Plan on 11/9.  Anxiety Likely contributing to his chest pain symptoms.  He also notes that his pain improves when he is lying in a hot bath and resting, therefore this supports anxiety as a possible factor.  We will put him back on Zoloft 25 mg daily.  It looks like he was doing well on this dose, therefore would wait for titration.  HTN (hypertension) BP continues to be elevated.  He has been taking losartan 100 mg daily.  We will check a BMP today.  We will also restart verapamil at low-dose of 120 mg.  Have him follow-up in 1-1/2 weeks with his PCP on 11/9.     Cleophas Dunker, [place]

## 2020-03-25 NOTE — Assessment & Plan Note (Signed)
He is tachycardic on examination today, EKG has heart rate of 98, ambulating in the clinic he was 100-119.  We will restart him on verapamil as he was on this previously and discontinued it himself by not having refills.  We will restart him on 120 mg daily and titrate up as needed.  Advised to follow-up with cardiology, hopefully before he sees Dr. Higinio Plan on 11/9.

## 2020-03-25 NOTE — Assessment & Plan Note (Signed)
Likely contributing to his chest pain symptoms.  He also notes that his pain improves when he is lying in a hot bath and resting, therefore this supports anxiety as a possible factor.  We will put him back on Zoloft 25 mg daily.  It looks like he was doing well on this dose, therefore would wait for titration.

## 2020-03-26 LAB — BASIC METABOLIC PANEL
BUN/Creatinine Ratio: 16 (ref 9–20)
BUN: 15 mg/dL (ref 6–24)
CO2: 26 mmol/L (ref 20–29)
Calcium: 10.1 mg/dL (ref 8.7–10.2)
Chloride: 102 mmol/L (ref 96–106)
Creatinine, Ser: 0.96 mg/dL (ref 0.76–1.27)
GFR calc Af Amer: 106 mL/min/{1.73_m2} (ref >59)
GFR calc non Af Amer: 92 mL/min/{1.73_m2} (ref >59)
Glucose: 97 mg/dL (ref 65–99)
Potassium: 4.3 mmol/L (ref 3.5–5.2)
Sodium: 142 mmol/L (ref 134–144)

## 2020-04-06 ENCOUNTER — Other Ambulatory Visit: Payer: Self-pay

## 2020-04-06 ENCOUNTER — Other Ambulatory Visit: Payer: Self-pay | Admitting: Cardiology

## 2020-04-06 ENCOUNTER — Ambulatory Visit (HOSPITAL_COMMUNITY)
Admission: RE | Admit: 2020-04-06 | Discharge: 2020-04-06 | Disposition: A | Discharge status: Home / Self Care | Payer: BC Managed Care – PPO | Source: Ambulatory Visit | Attending: Family Medicine | Admitting: Family Medicine

## 2020-04-06 ENCOUNTER — Ambulatory Visit (INDEPENDENT_AMBULATORY_CARE_PROVIDER_SITE_OTHER): Payer: BC Managed Care – PPO | Admitting: Family Medicine

## 2020-04-06 ENCOUNTER — Encounter: Payer: Self-pay | Admitting: Family Medicine

## 2020-04-06 VITALS — BP 134/100 | HR 120 | Wt 305.2 lb

## 2020-04-06 DIAGNOSIS — E781 Pure hyperglyceridemia: Secondary | ICD-10-CM

## 2020-04-06 DIAGNOSIS — R079 Chest pain, unspecified: Secondary | ICD-10-CM

## 2020-04-06 DIAGNOSIS — E119 Type 2 diabetes mellitus without complications: Secondary | ICD-10-CM | POA: Diagnosis not present

## 2020-04-06 DIAGNOSIS — I10 Essential (primary) hypertension: Secondary | ICD-10-CM

## 2020-04-06 DIAGNOSIS — R000 Tachycardia, unspecified: Secondary | ICD-10-CM | POA: Diagnosis not present

## 2020-04-06 DIAGNOSIS — F419 Anxiety disorder, unspecified: Secondary | ICD-10-CM

## 2020-04-06 MED ORDER — ROSUVASTATIN CALCIUM 10 MG PO TABS: 10.0000 mg | ORAL_TABLET | Freq: Every evening | ORAL | 3 refills | Status: DC

## 2020-04-06 MED ORDER — VERAPAMIL HCL ER 240 MG PO TBCR: 240.0000 mg | EXTENDED_RELEASE_TABLET | Freq: Every day | ORAL | 1 refills | Status: DC

## 2020-04-06 NOTE — Progress Notes (Signed)
SUBJECTIVE:   CHIEF COMPLAINT / HPI: F/u HTN and chest pain   Rodney Lane is a 49 year old gentleman presenting for follow-up of hypertension and chest pain.  He was recently seen in our office on 10/28 for follow-up of chest pain after reports he had recurrent chest pain similar to episodes a year ago recurring shortly after our visit on 10/14.  He had extensive work-up per cardiology and pulmonology last year (and preserved EF on recent echo 10/4), ultimately started treatment for anxiety and had resolution of chest pain--ran out of his medications around 07/2019 and now similar symptoms have restarted since mid 02/2020.   On 10/28, Recommended he follow-up with his cardiologist and restarted Zoloft and verapamil.  He scheduled a follow-up appointment with his cardiologist yesterday (they called) for the end of January 2022.  Reports he is not doing well, felt like the chest pain got a little bit better after his recent visit and has come back up. BP at home has been around 829-562 systolic and 130-865 diastolic. Still having the chest pain ("like taking a fist in my chest), says "every other hour." Sometimes goes down both arms, tingling. Having headaches when blood pressure is high that will go either behind his left or right eye, throbbing. Denies any associated neck pain, sweating, lightheadedness/dizziness, SOB with it.  Able to sleep without concern, does not wake him up from sleep.  Most of the times comes on when he is sitting down, hurts when he presses on his chest. Usually feels it after he finishes an activity.   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  PERTINENT  PMH / PSH: Type 2 diabetes, OSA on cpap, hypertension, moderate hypertriglyceridemia, BMI of 45  OBJECTIVE:   BP (!) 134/100   Pulse (!) 120   Wt (!) 305 lb 3.2 oz (138.4 kg)   SpO2 96%   BMI 45.07 kg/m   General: Alert, NAD, sitting comfortably in  chair HEENT: NCAT, MMM, EOMI, PERRLA Cardiac: Tachycardic with regular rhythm no m/g/r appreciated, no JVD, exquisitely tender to distal sternum and midline sternal costal joints to palpation Lungs: Clear bilaterally, no increased WOB on room air, no wheezing or crackles noted Abdomen: soft  Msk: Full ROM of bilateral shoulders and cervical spine without discomfort.  Moves all extremities spontaneously, 5/5 upper and lower extremity strength, chest discomfort provoked with pectoralis muscle tension bilaterally (resisted arm flexion), sensation to light touch intact throughout upper and lower extremity.  Normal gait. Ext: Warm, dry, 2+ distal pulses, no edema   EKG personally reviewed sinus tachycardia with HR 107, NSR, no ischemic changes  ASSESSMENT/PLAN:   Chest pain Subacute, recurrent.  Atypical in nature. DDx including MSK, anxiety, tachycardia mediated, CAD related--likely suspect multifactorial.  Reassured by negative Lexiscan Myoview in 11/2018 with extensive cardiology work-up last year and unchanged (unremarkable as well) EKG today, regardless given several CV risk factors as above it would be prudent to extensively rule out ischemic etiology.  Especially given his exquisite tenderness upon palpation, feel that this is contributing.  Will route to his cardiologist, Dr. Johnsie Cancel, to assess if he can be evaluated sooner.  Additionally recommended ice/heat with upper extremity stretching.  Will increase Zoloft next week.   Sinus tachycardia HR 105-120s during visit.  Will increase verapamil to 40 mg daily from 120mg  previously.  HTN (hypertension) Persistently elevated.  Reports compliance with losartan 100 mg daily.  Increasing his verapamil as  above, however he previously had been on 2-3 antihypertensives to maintain at goal.  Can consider adding chlorthalidone or Aldactone on next visit.  Anxiety Hopeful to titrate up Zoloft on next visit, will hold off today given many medications  changes as above.  Discussed stress relieving activities and deep breathing.  Hypertriglyceridemia ASCVD 10-year risk 18.8% with moderate hypertriglyceridemia on recent lipid panel 02/2020.  Will start Crestor 10 mg for primary prevention/cholesterol reduction and titrate up.    Discussed with Dr. Andria Frames.  Provided work letter to remain out of work through 11/19 for continued evaluation.  ED precautions extensively discussed.  Follow-up early next week or sooner if needed.  Rodney Lane, Rodney Lane

## 2020-04-06 NOTE — Patient Instructions (Signed)
It was wonderful to see you today.  I will send a message to your cardiologist to see if we can get you in quicker.   I am going to increase your losartan from 50 to 100 mg a day, you can take 2 tablets until you run out and then I will send in a refill of 100 mg tablets.  I am also going to increase her verapamil.  Lastly I am going to restart you on a statin medication to help with your cholesterol.  We will discuss increasing your Zoloft on a future visit.  Please make sure that you are trying to take deep breaths whenever you are feeling particularly anxious.  I also encourage you to try ice or heat 20 minutes at a time several times a day on the middle of your chest where it feels sore.  Try to do range of motion exercises so that your body does not get stiff.   Follow-up in 1 week with myself or sooner if needed.  Please go to the ED if the chest pain is persistent and severe, associated with any shortness of breath, sweating, lightheadedness/dizziness, blurred vision.

## 2020-04-07 ENCOUNTER — Telehealth: Payer: Self-pay | Admitting: *Deleted

## 2020-04-07 ENCOUNTER — Encounter: Payer: Self-pay | Admitting: Family Medicine

## 2020-04-07 NOTE — Assessment & Plan Note (Addendum)
Persistently elevated.  Reports compliance with losartan 100 mg daily.  Increasing his verapamil as above, however he previously had been on 2-3 antihypertensives to maintain at goal.  Can consider adding chlorthalidone or Aldactone on next visit.

## 2020-04-07 NOTE — Assessment & Plan Note (Addendum)
Hopeful to titrate up Zoloft on next visit, will hold off today given many medications changes as above.  Discussed stress relieving activities and deep breathing.

## 2020-04-07 NOTE — Assessment & Plan Note (Addendum)
Subacute, recurrent.  Atypical in nature. DDx including MSK, anxiety, tachycardia mediated, CAD related--likely suspect multifactorial.  Reassured by negative Lexiscan Myoview in 11/2018 with extensive cardiology work-up last year and unchanged (unremarkable as well) EKG today, regardless given several CV risk factors as above it would be prudent to extensively rule out ischemic etiology.  Especially given his exquisite tenderness upon palpation, feel that this is contributing.  Will route to his cardiologist, Dr. Johnsie Cancel, to assess if he can be evaluated sooner.  Additionally recommended ice/heat with upper extremity stretching.  Will increase Zoloft next week.

## 2020-04-07 NOTE — Telephone Encounter (Signed)
Patient informed and verbalized understanding of plan. 

## 2020-04-07 NOTE — Telephone Encounter (Signed)
Per Jenel Lucks called wanting patient seen sooner. ? [place] / [place] recurrent chest pain probably needs cath He lives in [place]   Per [place]-Patient of Dr. Johnsie Cancel, please get them set up for office visit with APP in either [place] or [place] as soon as possible.

## 2020-04-07 NOTE — Assessment & Plan Note (Signed)
HR 105-120s during visit.  Will increase verapamil to 40 mg daily from 120mg  previously.

## 2020-04-07 NOTE — Telephone Encounter (Signed)
Appointment scheduled with Coletta Memos 04/12/2020 @1 :45 pm. Awaiting call back from patient to inform.

## 2020-04-07 NOTE — Assessment & Plan Note (Signed)
ASCVD 10-year risk 18.8% with moderate hypertriglyceridemia on recent lipid panel 02/2020.  Will start Crestor 10 mg for primary prevention/cholesterol reduction and titrate up.

## 2020-04-11 NOTE — Progress Notes (Signed)
Cardiology Clinic Note   Patient Name: Rodney Lane Date of Encounter: 04/12/2020  Primary Care Provider:  Patriciaann Clan, DO Primary Cardiologist:  Rozann Lesches, MD  Patient Profile    Rodney Lane 49 year old male presents the clinic today for evaluation of his chest pain.  Past Medical History    Past Medical History:  Diagnosis Date   Blurry vision, bilateral 08/26/2019   Chest pain at rest 07/15/2019   Chest pain, rule out acute myocardial infarction 11/07/2018   HTN (hypertension)    Tachycardia    Not sure why it goes fast, but someone put him on cardizem.  Appears to be S.tach   Past Surgical History:  Procedure Laterality Date   WISDOM TOOTH EXTRACTION Bilateral    2000    Allergies  No Known Allergies  History of Present Illness    Mr. Rodney Lane is a PMH of HTN, OSA, DM, renal insufficiency, sinus tachycardia, dyspnea, anxiety, hypertriglyceridemia, and chest pain.  He was last seen by Dr. Johnsie Cancel on  02/26/2019.  He had been admitted to [facility] 11/07/2018 with acute onset of worsening shortness of breath, chest pain, and palpitations.  In the emergency department his heart rate was noted to be sinus tach in the 150s.  He initially received adenosine which had no effect then IVF, ASA and nitroglycerin which improved his heart rate and symptoms.  A chest CT was negative for PE, Covid test was negative, no obvious signs of infection.  WBCs were normal.  TSH normal and CRP/sed rate elevated.  Hemoglobin A1c 6.8.  His echocardiogram showed an LVEF of 50-55% with no wall motion abnormalities, impaired relaxation, and no pericardial effusion.  He declined stress test due to bad past experience.  It was felt that he was not a good candidate for coronary CT due to his body habitus and tachycardia.  He was seen and evaluated by Dr. Curt Bears and placed on diltiazem 240 mg daily.  He was instructed to increase p.o. hydration his beta-blocker was stopped due to  weakness and fatigue.  He was seen by cardiology on 11/25/2018 and still felt bad.  He underwent nuclear stress test which showed no ischemia.  On follow-up with EP his diltiazem was stopped, verapamil ER 360 was started and losartan 50 mg daily was also started.  He still complained of shortness of breath so PFTs and pulmonology consult were made.  He was offered a right and left heart cath but declined.  His PFTs were reviewed 01/02/2019 and were normal with minimal small airway disease.  He was getting a CPAP arranged through Dr. Lamonte Sakai at that time.  He had multiple somatic complaints.  It was felt that his complaints were related to being out of shape, overweight, having headaches, loss of appetite and muscle aches and pains.  Patients PCP contacted triage line on 04/07/2020 to establish a sooner appointment for evaluation of chest pain.  PCP felt patient may need to have cardiac catheterization.  He presents the clinic today for follow-up evaluation states he notices bilateral arm tingling occasionally.  He states that this pain/sensation is very similar to what happened last year, he denies exertional chest pain.  We reviewed his echocardiogram.  He states that he has always been very active playing basketball and football as he was growing up.  He then joined the [employer] for 9 years.  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  He has also noticed some  increasing weakness in bilateral arms.  His symptoms appear more in line with nerve impingement/spinal issues.  I will give him the salty 6 diet sheet, have him increase his physical activity as tolerated, increase his losartan to 100 mg daily, have him keep a blood pressure log and have him return for follow-up in 1 month.  Today he denies increased chest pain, shortness of breath, lower extremity edema, fatigue, palpitations, melena, hematuria, hemoptysis, diaphoresis, weakness, presyncope, syncope, orthopnea, and  PND.   Home Medications    Prior to Admission medications   Medication Sig Start Date End Date Taking? Authorizing Provider  albuterol (VENTOLIN HFA) 108 (90 Base) MCG/ACT inhaler Inhale 1-2 puffs into the lungs every 6 (six) hours as needed for wheezing or shortness of breath.    [provider]  hydrOXYzine (ATARAX/VISTARIL) 10 MG tablet Take 1 tablet (10 mg total) by mouth 2 (two) times daily as needed for itching or anxiety. 11/10/18   Guilford Shi, MD  losartan (COZAAR) 50 MG tablet Take 1 tablet (50 mg total) by mouth at bedtime. 03/11/20   Patriciaann Clan, DO  meloxicam (MOBIC) 7.5 MG tablet Take 1 tablet (7.5 mg total) by mouth daily. 03/11/20   Patriciaann Clan, DO  rosuvastatin (CRESTOR) 10 MG tablet Take 1 tablet (10 mg total) by mouth at bedtime. Increase to two (2) tabs after 1 week if tolerating well. 04/06/20   Patriciaann Clan, DO  sertraline (ZOLOFT) 25 MG tablet Take 1 tablet (25 mg total) by mouth daily. 03/25/20   Meccariello, Bernita Raisin, DO  verapamil (CALAN-SR) 240 MG CR tablet Take 1 tablet (240 mg total) by mouth at bedtime. 04/06/20   Patriciaann Clan, DO    Family History    Family History  Problem Relation Age of Onset   Cancer Mother    Cancer Father    Diabetes Father    Heart disease Maternal Grandfather    Heart disease Paternal Grandfather    He indicated that his mother is alive. He indicated that his father is deceased. He indicated that his maternal grandmother is deceased. He indicated that his maternal grandfather is deceased. He indicated that his paternal grandmother is deceased. He indicated that his paternal grandfather is deceased.  Social History    Social History   Socioeconomic History   Marital status: Single    Spouse name: Not on file   Number of children: Not on file   Years of education: Not on file   Highest education level: Not on file  Occupational History   Not on file  Tobacco Use   Smoking  status: Never Smoker   Smokeless tobacco: Never Used  Vaping Use   Vaping Use: Never used  Substance and Sexual Activity   Alcohol use: Never   Drug use: Never   Sexual activity: Not on file  Other Topics Concern   Not on file  Social History Narrative   Not on file   Social Determinants of Health   Financial Resource Strain:    Difficulty of Paying Living Expenses: Not on file  Food Insecurity:    Worried About [place] in the Last Year: Not on file   Ran Out of Food in the Last Year: Not on file  Transportation Needs:    Lack of Transportation (Medical): Not on file   Lack of Transportation (Non-Medical): Not on file  Physical Activity:    Days of Exercise per Week: Not on file  Minutes of Exercise per Session: Not on file  Stress:    Feeling of Stress : Not on file  Social Connections:    Frequency of Communication with Friends and Family: Not on file   Frequency of Social Gatherings with Friends and Family: Not on file   Attends Religious Services: Not on file   Active Member of Clubs or Organizations: Not on file   Attends Archivist Meetings: Not on file   Marital Status: Not on file  Intimate Partner Violence:    Fear of Current or Ex-Partner: Not on file   Emotionally Abused: Not on file   Physically Abused: Not on file   Sexually Abused: Not on file     Review of Systems    General:  No chills, fever, night sweats or weight changes.  Cardiovascular:  No chest pain, dyspnea on exertion, edema, orthopnea, palpitations, paroxysmal nocturnal dyspnea. Dermatological: No rash, lesions/masses Respiratory: No cough, dyspnea Urologic: No hematuria, dysuria Abdominal:   No nausea, vomiting, diarrhea, bright red blood per rectum, melena, or hematemesis Neurologic:  No visual changes, wkns, changes in mental status. All other systems reviewed and are otherwise negative except as noted above.  Physical Exam    VS:  BP  140/90    Pulse (!) 101    Ht 5\' 9"  (1.753 m)    Wt (!) 305 lb (138.3 kg)    SpO2 97%    BMI 45.04 kg/m  , BMI Body mass index is 45.04 kg/m. GEN: Well nourished, well developed, in no acute distress. HEENT: normal. Neck: Supple, no JVD, carotid bruits, or masses. Cardiac: Tachycardic, no murmurs, rubs, or gallops. No clubbing, cyanosis, edema.  Radials/DP/PT 2+ and equal bilaterally.  Respiratory:  Respirations regular and unlabored, clear to auscultation bilaterally. GI: Soft, nontender, nondistended, BS + x 4. MS: no deformity or atrophy.  Bilateral arm weakness. Skin: warm and dry, no rash. Neuro:  Strength and sensation are intact. Psych: Normal affect.  Accessory Clinical Findings    Recent Labs: 07/15/2019: Hemoglobin 14.1; Platelets 233 03/25/2020: BUN 15; Creatinine, Ser 0.96; Potassium 4.3; Sodium 142   Recent Lipid Panel    Component Value Date/Time   CHOL 212 (H) 03/11/2020 0922   TRIG 452 (H) 03/11/2020 0922   HDL 37 (L) 03/11/2020 0922   CHOLHDL 5.7 (H) 03/11/2020 0922   CHOLHDL 6.9 11/08/2018 0527   VLDL UNABLE TO CALCULATE IF TRIGLYCERIDE OVER 400 mg/dL 11/08/2018 0527   LDLCALC 99 03/11/2020 0922   LDLDIRECT 78.4 11/08/2018 0527    ECG personally reviewed by me today-none today.  Echocardiogram 03/01/2020 IMPRESSIONS    1. Left ventricular ejection fraction, by estimation, is 60 to 65%. Left  ventricular ejection fraction by 3D volume is 61 %. Left ventricular  ejection fraction by PLAX is 65 %. The left ventricle has normal function.  The left ventricle has no regional  wall motion abnormalities. Left ventricular diastolic parameters are  consistent with Grade II diastolic dysfunction (pseudonormalization).  2. Right ventricular systolic function is normal. The right ventricular  size is normal. There is normal pulmonary artery systolic pressure.  3. The mitral valve is normal in structure. No evidence of mitral valve  regurgitation. No evidence of  mitral stenosis.  4. The aortic valve is tricuspid. Aortic valve regurgitation is not  visualized. No aortic stenosis is present.  5. Aortic dilatation noted. 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 There is mild dilatation  of the  ascending aorta, measuring 38 mm.  6. The inferior vena cava is normal in size with greater than 50%  respiratory variability, suggesting right atrial pressure of 3 mmHg.   Nuclear stress test 11/27/2018  Nuclear stress EF: 37%. Generalized hypokinesis noted. Dilated left ventricle.  There was no ST segment deviation noted during stress. There was no transient ischemic dilatation.  This is an intermediate risk study based upon reduction in ejection fraction. There were no perfusion defects identified suggestive of nonischemic cardiomyopathy   Assessment & Plan   1.  Chest discomfort-no chest pain today.  Echocardiogram 10/21 showed normal LVEF, no significant valvular abnormalities, some aortic root dilation.  Recommendation for CTA chest in 10/22.  This is discomfort/arm tingling appears to be more in line with neurological issues. Order nuclear stress test Heart healthy low-sodium diet-salty 6 given Increase physical activity as tolerated Refer to neurology for C-spine/thoracic spine work-up.  Essential hypertension-BP today 140/90.  Fairly well-controlled at home. Continue increase losartan to 100 mg Continue verapamil Heart healthy low-sodium diet-salty 6 given Increase physical activity as tolerated Blood pressure log given Instructed to bring blood pressure cuff to next appointment  Tachycardia-heart rate today 101.  No recent episodes of rapid heart rate or skipped beats. Continue verapamil Heart healthy low-sodium diet-salty 6 given Increase physical activity as tolerated Avoid triggers caffeine, chocolate, EtOH etc. Follows with EP  Hyperlipidemia-LDL 99 on 03/11/2020 Continue  rosuvastatin Heart healthy low-sodium high-fiber diet  DOE-no increased work of breathing or activity intolerance.  Recent echocardiogram showed normal LVEF, and no significant valvular abnormalities. Follows with pulmonology  Disposition: Follow-up with Dr. Domenic Polite in 1 month.  Jossie Ng. Melodi Happel NP-C    04/12/2020, 2:09 PM [facility] [address] [address] Office [phone] Fax [phone]  Notice: This dictation was prepared with Dragon dictation along with smaller phrase technology. Any transcriptional errors that result from this process are unintentional and may not be corrected upon review.

## 2020-04-12 ENCOUNTER — Ambulatory Visit (INDEPENDENT_AMBULATORY_CARE_PROVIDER_SITE_OTHER): Payer: BC Managed Care – PPO | Admitting: General Practice

## 2020-04-12 ENCOUNTER — Other Ambulatory Visit: Payer: Self-pay

## 2020-04-12 ENCOUNTER — Encounter: Payer: Self-pay | Admitting: General Practice

## 2020-04-12 VITALS — BP 140/90 | HR 101 | Ht 69.0 in | Wt 305.0 lb

## 2020-04-12 DIAGNOSIS — R0600 Dyspnea, unspecified: Secondary | ICD-10-CM

## 2020-04-12 DIAGNOSIS — R000 Tachycardia, unspecified: Secondary | ICD-10-CM | POA: Diagnosis not present

## 2020-04-12 DIAGNOSIS — E782 Mixed hyperlipidemia: Secondary | ICD-10-CM | POA: Diagnosis not present

## 2020-04-12 DIAGNOSIS — R079 Chest pain, unspecified: Secondary | ICD-10-CM | POA: Diagnosis not present

## 2020-04-12 DIAGNOSIS — R29898 Other symptoms and signs involving the musculoskeletal system: Secondary | ICD-10-CM

## 2020-04-12 DIAGNOSIS — I10 Essential (primary) hypertension: Secondary | ICD-10-CM

## 2020-04-12 MED ORDER — AMLODIPINE BESYLATE 5 MG PO TABS: 5.0000 mg | ORAL_TABLET | Freq: Every day | ORAL | 3 refills | Status: DC

## 2020-04-12 MED ORDER — LOSARTAN POTASSIUM 100 MG PO TABS: 100.0000 mg | ORAL_TABLET | Freq: Every day | ORAL | 3 refills | Status: DC

## 2020-04-12 NOTE — Patient Instructions (Addendum)
Medication Instructions:    INCREASE Losartan to 100 mg daily    All other medications remain the same.   *If you need a refill on your cardiac medications before your next appointment, please call your pharmacy*   Lab Work: None today If you have labs (blood work) drawn today and your tests are completely normal, you will receive your results only by: Marland Kitchen MyChart Message (if you have MyChart) OR . A paper copy in the mail If you have any lab test that is abnormal or we need to change your treatment, we will call you to review the results.   Testing/Procedures:  None today    Follow-Up: At [facility], you and your health needs are our priority.  As part of our continuing mission to provide you with exceptional heart care, we have created designated Provider Care Teams.  These Care Teams include your primary Cardiologist (physician) and Advanced Practice Providers (APPs -  Physician Assistants and Nurse Practitioners) who all work together to provide you with the care you need, when you need it.  We recommend signing up for the patient portal called "MyChart".  Sign up information is provided on this After Visit Summary.  MyChart is used to connect with patients for Virtual Visits (Telemedicine).  Patients are able to view lab/test results, encounter notes, upcoming appointments, etc.  Non-urgent messages can be sent to your provider as well.   To learn more about what you can do with MyChart, go to [email or web address].    Your next appointment:   1 month(s)  The format for your next appointment:   In Person  Provider:   You may see Rozann Lesches, MD or one of the following Advanced Practice Providers on your designated Care Team:    Bernerd Pho, PA-C   Ermalinda Barrios, PA-C     Other Instructions Take BP daily, keep log.Bring log and BP machine with you to next visit.

## 2020-04-13 ENCOUNTER — Ambulatory Visit (INDEPENDENT_AMBULATORY_CARE_PROVIDER_SITE_OTHER): Payer: BC Managed Care – PPO | Admitting: Family Medicine

## 2020-04-13 ENCOUNTER — Encounter: Payer: Self-pay | Admitting: Family Medicine

## 2020-04-13 VITALS — BP 138/90 | HR 110 | Wt 302.0 lb

## 2020-04-13 DIAGNOSIS — Z1211 Encounter for screening for malignant neoplasm of colon: Secondary | ICD-10-CM

## 2020-04-13 DIAGNOSIS — I10 Essential (primary) hypertension: Secondary | ICD-10-CM

## 2020-04-13 DIAGNOSIS — R000 Tachycardia, unspecified: Secondary | ICD-10-CM | POA: Diagnosis not present

## 2020-04-13 DIAGNOSIS — F419 Anxiety disorder, unspecified: Secondary | ICD-10-CM | POA: Diagnosis not present

## 2020-04-13 NOTE — Patient Instructions (Addendum)
It was wonderful to see you today.  Please bring ALL of your medications with you to every visit.   Today we talked about:  Your blood pressure and reviewed your home log. We will recheck your blood pressure in 2 weeks since your medication change was just yesterday.   We also discussed anxiety could also contribute to increased heart rate and BP.   I have referred you for a colonoscopy. They will call you to schedule your appt in 1-2 weeks.  Please be sure to schedule follow up at the front  desk before you leave today.   Please call the clinic at [phone] if your symptoms worsen or you have any concerns. It was our pleasure to serve you.  Dr. Janus Molder

## 2020-04-13 NOTE — Progress Notes (Signed)
    SUBJECTIVE:   CHIEF COMPLAINT / HPI:   Rodney Lane is a 49 yo M who presents for follow up.   HTN follow up Following up for BP check. Working with cardiology and Rodney Lane to be able to go back to work when BP is stable. Fears of having a stroke at work and having to return to the hospital if chest pain returns. He denies chest pain today.  - Medications: Losartan 100 mg daily (increased from 50 mg yesterday by cardiology) - Compliance: Yes - Checking BP at home: yes w/ wrist  - Denies any SOB, CP, vision changes, or symptoms of hypotension  Tachycardia Home HR log range 91-127. Has intermittent chest pain that resolves on its own. Endorses compliance with verapamil 240 mg daily. Admits there could be a component of anxiety to his increased heart rate.   Anxiety/Depression Discusses that he does not feel negative about his current state of health but he is being practical and hopes that it gets better. He does not desire to return to the hospital but fears this is a possibility if his blood pressure is not controlled. He is a retired Rodney Lane and feels he thinks a lot about his health and possibly having a stroke at work.   PERTINENT  PMH / PSH: T2DM, Hx of chest pain  OBJECTIVE:   BP 138/90   Pulse (!) 110   Wt (!) 302 lb (137 kg)   SpO2 97%   BMI 44.60 kg/m     PHQ9 SCORE ONLY 04/13/2020 04/06/2020 03/25/2020  PHQ-9 Total Score 5 4 6    GAD 7 : Generalized Anxiety Score 04/13/2020  Nervous, Anxious, on Edge 1  Control/stop worrying 1  Worry too much - different things 1  Trouble relaxing 0  Restless 0  Easily annoyed or irritable 0  Afraid - awful might happen 0  Total GAD 7 Score 3  Anxiety Difficulty Somewhat difficult   General: Appears well, no acute distress. Age appropriate. Cardiac: Tachycardia regular rhythm, normal heart sounds, no murmurs Respiratory: normal effort Psych: normal affect  ASSESSMENT/Lane:   HTN (hypertension) BP controlled  today. Recent medication increase per cardiology. Patient with BP log with higher pressures than today's. Uses a wrist cuff at home likely giving higher readings than manual reading which is more accurate. Patient with fear of having a too high of blood pressure at work. Would like work note extended to Dec. 1  -Continue losartan 100 mg daily -Continue BP log -Return precautions discussed -Follow up in 2 weeks for BP check  Tachycardia Persistent tachycardia. HR avg. 100 at rest. Recent cardiology appointment. Consider anxiety or possible PTSD for other contributory causes. Patient with hx of Rodney Lane.  -Continue monitor with subsequent visit -Continue Verapamil 240 mg daily  Anxiety Endorses continued thoughts and negative surrounding current state of health. Level of anxiety or worry could be contributory to tachycardia/HTN. Medications include atarax 10mg  and sertaline 25 mg. Would consider addressing further in the future to see if additional therapy or medication changes could improve the tachycardia/HTN.  Encounter for screening colonoscopy for non-high-risk patient - Ambulatory referral to Gastroenterology  Gerlene Fee, [place]

## 2020-04-14 NOTE — Assessment & Plan Note (Signed)
Persistent tachycardia. HR avg. 100 at rest. Recent cardiology appointment. Consider anxiety or possible PTSD for other contributory causes. Patient with hx of Games developer.  -Continue monitor with subsequent visit -Continue Verapamil 240 mg daily

## 2020-04-14 NOTE — Assessment & Plan Note (Signed)
Endorses continued thoughts and negative surrounding current state of health. Level of anxiety or worry could be contributory to tachycardia/HTN. Medications include atarax 10mg  and sertaline 25 mg. Would consider addressing further in the future to see if additional therapy or medication changes could improve the tachycardia/HTN.

## 2020-04-14 NOTE — Assessment & Plan Note (Addendum)
BP controlled today. Recent medication increase per cardiology. Patient with BP log with higher pressures than today's. Uses a wrist cuff at home likely giving higher readings than manual reading which is more accurate. Patient with fear of having a too high of blood pressure at work. Would like work note extended to Dec. 1  -Continue losartan 100 mg daily -Continue BP log -Return precautions discussed -Follow up in 2 weeks for BP check

## 2020-04-18 ENCOUNTER — Encounter: Payer: Self-pay | Admitting: Family Medicine

## 2020-04-27 NOTE — Progress Notes (Signed)
    SUBJECTIVE:   CHIEF COMPLAINT / HPI:   Mr. Hangartner is a 49 yo M who presents for the below issues.   Hypertension: - Medications: Losartan 100 mg daily, Verapamil 240 mg daily - Compliance: Yes - Checking BP at home: Yes, with wrist cuff avg. SBP 98-160 DBP 40-123 - Endorses occasional headache relieved by tylenol and vision changes that resolve on their own.   Anxiety Continues to take his medication as prescribe but is not in therapy. Has seen a therapist prior. Open to talking to someone.   PERTINENT  PMH / PSH: OSA (occasionally wears CPAP), tachycardia (improved from last visit)  OBJECTIVE:   BP 118/72   Pulse 87   Wt (!) 306 lb 12.8 oz (139.2 kg)   SpO2 96%   BMI 45.31 kg/m   PHQ9 SCORE ONLY 04/28/2020 04/13/2020 04/06/2020  PHQ-9 Total Score 11 5 4    General: Appears well, no acute distress. Age appropriate. Cardiac: RRR, normal heart sounds, no murmurs Respiratory: CTAB, normal effort Extremities: No edema or cyanosis. Psych: normal affect  ASSESSMENT/PLAN:   HTN (hypertension) Chronic. BP at goal, HR wnl. Discussed with patient that BPs at home may not be as accurate with a wrist cuff compared to traditional manual. Patient pursuing disability with PCP. Letter for work excuse until next week. Needs to revisit this with PCP consider his BP is controlled today. Discussed wearing CPAP nightly to help with headaches.  -Continue medications -F/u if headaches fail to improve with wearing CPAP. -F/u w/ cadiology  Anxiety Chronic. Continues to have issues with anxiety and depression. Patient is not fully convinced that his blood pressure is improving and he is more than likely safe to return to work. He has endorse thoughts of stroking out at work as a fear in the past. PHQ9 increased today. Zoloft recently increased to 50mg . Encouraged to start therapy. [email or web address] resource described and given to patient.    Gerlene Fee, [place]

## 2020-04-28 ENCOUNTER — Other Ambulatory Visit: Payer: Self-pay

## 2020-04-28 ENCOUNTER — Encounter: Payer: Self-pay | Admitting: Family Medicine

## 2020-04-28 ENCOUNTER — Ambulatory Visit (INDEPENDENT_AMBULATORY_CARE_PROVIDER_SITE_OTHER): Payer: BC Managed Care – PPO | Admitting: Family Medicine

## 2020-04-28 VITALS — BP 118/72 | HR 87 | Wt 306.8 lb

## 2020-04-28 DIAGNOSIS — F419 Anxiety disorder, unspecified: Secondary | ICD-10-CM

## 2020-04-28 DIAGNOSIS — I10 Essential (primary) hypertension: Secondary | ICD-10-CM | POA: Diagnosis not present

## 2020-04-28 NOTE — Patient Instructions (Addendum)
It was wonderful to see you today.  Please bring ALL of your medications with you to every visit.   Today we talked about:  Your blood pressure which is wonderful today. You continue to get high readings at home, please keep in mind that the cuff in the office is more accurate reading.   Wear your CPAP nightly.   We discuss seeking out speaking with a psychologist. Please visit [email or web address] to find a therapist of your choosing. In the meantime continue zoloft 50mg  daily.   Dr. Higinio Plan is working on your disability paperwork.   Please be sure to schedule follow up at the front  desk before you leave today.   Please call the clinic at [phone] if your symptoms worsen or you have any concerns. It was our pleasure to serve you.  Dr. Janus Molder

## 2020-04-28 NOTE — Assessment & Plan Note (Addendum)
Chronic. BP at goal, HR wnl. Discussed with patient that BPs at home may not be as accurate with a wrist cuff compared to traditional manual. Patient pursuing disability with PCP. Letter for work excuse until next week. Needs to revisit this with PCP consider his BP is controlled today. Discussed wearing CPAP nightly to help with headaches.  -Continue medications -F/u if headaches fail to improve with wearing CPAP. -F/u w/ cadiology

## 2020-04-28 NOTE — Assessment & Plan Note (Addendum)
Chronic. Continues to have issues with anxiety and depression. Patient is not fully convinced that his blood pressure is improving and he is more than likely safe to return to work. He has endorse thoughts of stroking out at work as a fear in the past. PHQ9 increased today. Zoloft recently increased to 50mg . Encouraged to start therapy. [email or web address] resource described and given to patient.

## 2020-05-06 ENCOUNTER — Encounter: Payer: Self-pay | Admitting: Family Medicine

## 2020-05-10 ENCOUNTER — Encounter: Payer: Self-pay | Admitting: Family Medicine

## 2020-05-10 ENCOUNTER — Other Ambulatory Visit: Payer: Self-pay | Admitting: Family Medicine

## 2020-05-12 ENCOUNTER — Encounter: Payer: Self-pay | Admitting: Physician Assistant

## 2020-05-12 NOTE — Progress Notes (Deleted)
Cardiology Office Note    Date:  05/12/2020   ID:  Rodney Lane, DOB [birth year], MRN [id number]  PCP:  Patriciaann Clan, DO  Cardiologist:  Rozann Lesches, MD  Electrophysiologist:  None   Chief Complaint: f/u BP  History of Present Illness:   Rodney Lane is a 49 y.o. male with history of HTN, obesity, OSA (started on CPAP by pulm), diet-controlled DM, inappropriate sinus tachycardia, anxiety, hypertriglyceridemia (followed by PCP), hepatic steatosis by imaging, dilation of aorta who presents for follow-up of chest pain.  He was initially seen by Dr. Johnsie Cancel in 10/2018 during admission for chest pain. He was noted to be tachycardic felt to represent sinus tach in the 150s. Troponins were negative and CTA r/o PE (did show calcification of coronary arteries and hepatic steatosis). 2D echo showed EF 50-55%, no RWMA, impaired relaxation. WBC, TSH were normal and ESR/CRP were elevated. He was felt to be a poor candidate for coronary CTA. He was started on diltiazem. He underwent outpatient nuclear stress test with results below which were abnormal but Dr. Johnsie Cancel reviewed and felt that blood flow appeared normal per notes. No ischemia was noted. EF was 37% by that study but 50-55% the month prior. He was referred to EP for IST. Diltiazem was changed to verapamil along with addition of losartan. His beta blocker was previously stopped due to weakness/fatigue. Due to persistent SOB he was offered [facility] but the patient declined. He saw pulmonology with PFTs showing minimal small airway disease. Last echo as outlined below 02/2020 showing EF 61%, grade 2 DD, normal RV, moderate dilation of aorta at level of sinuses of Valsalva, mild dilatation of the ascending aorta. He recently saw Coletta Memos in follow-up back reporting continued bilateral arm tingling andsome weakness similar to prior complaints, with out any accelerating complaints. His blood pressure was elevated so losartan was increased   Dil  aorta - ct 02/2021 pcp started statin Should consider asa F/u BMET TSH?  Essential HTN Coronary artery calcification, also with history of chest pain Inappropriate sinus tach Dilation of aorta  Labwork independently reviewed: 02/2020 BMET wnl K 4.3, Cr 0.96, trig 452, LDL 99, HDL 37, TChol 212, A1c 6.1, CBC wnl   Past Medical History:  Diagnosis Date  . Blurry vision, bilateral 08/26/2019  . Chest pain at rest 07/15/2019  . Chest pain, rule out acute myocardial infarction 11/07/2018  . HTN (hypertension)   . Tachycardia    Not sure why it goes fast, but someone put him on cardizem.  Appears to be S.tach    Past Surgical History:  Procedure Laterality Date  . WISDOM TOOTH EXTRACTION Bilateral    2000    Current Medications: No outpatient medications have been marked as taking for the 05/13/20 encounter (Appointment) with Charlie Pitter, PA-C.   ***   Allergies:   Patient has no known allergies.   Social History   Socioeconomic History  . Marital status: Single    Spouse name: Not on file  . Number of children: Not on file  . Years of education: Not on file  . Highest education level: Not on file  Occupational History  . Not on file  Tobacco Use  . Smoking status: Never Smoker  . Smokeless tobacco: Never Used  Vaping Use  . Vaping Use: Never used  Substance and Sexual Activity  . Alcohol use: Never  . Drug use: Never  . Sexual activity: Not on file  Other Topics Concern  . Not  on file  Social History Narrative  . Not on file   Social Determinants of Health   Financial Resource Strain: Not on file  Food Insecurity: Not on file  Transportation Needs: Not on file  Physical Activity: Not on file  Stress: Not on file  Social Connections: Not on file     Family History:  The patient's ***family history includes Cancer in his father and mother; Diabetes in his father; Heart disease in his maternal grandfather and paternal grandfather.  ROS:   Please see  the history of present illness. Otherwise, review of systems is positive for ***.  All other systems are reviewed and otherwise negative.    EKGs/Labs/Other Studies Reviewed:    Studies reviewed are outlined and summarized above. Reports included below if pertinent.  2D Echo 02/2020 1. Left ventricular ejection fraction, by estimation, is 60 to 65%. Left  ventricular ejection fraction by 3D volume is 61 %. Left ventricular  ejection fraction by PLAX is 65 %. The left ventricle has normal function.  The left ventricle has no regional  wall motion abnormalities. Left ventricular diastolic parameters are  consistent with Grade II diastolic dysfunction (pseudonormalization).  2. Right ventricular systolic function is normal. The right ventricular  size is normal. There is normal pulmonary artery systolic pressure.  3. The mitral valve is normal in structure. No evidence of mitral valve  regurgitation. No evidence of mitral stenosis.  4. The aortic valve is tricuspid. Aortic valve regurgitation is not  visualized. No aortic stenosis is present.  5. Aortic dilatation noted. 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 There is mild dilatation of the  ascending aorta, measuring 38 mm.  6. The inferior vena cava is normal in size with greater than 50%  respiratory variability, suggesting right atrial pressure of 3 mmHg.   Event monitor 01/2019 NSR Average HR 89 bpm Rare PAC;s/ PVC;s No significant arrhythmias  NST 11/2018  Nuclear stress EF: 37%. Generalized hypokinesis noted. Dilated left ventricle.  There was no ST segment deviation noted during stress. There was no transient ischemic dilatation.  This is an intermediate risk study based upon reduction in ejection fraction. There were no perfusion defects identified suggestive of nonischemic cardiomyopathy.    Candee Furbish, MD Per notes, Dr Johnsie Cancel had reviewed and felt blood flow  looked OK and had recommended f/u with Dr. Curt Bears for inappropriate sinus tach   CT angio r/o PE 10/2018  IMPRESSION: 1. Negative for acute pulmonary embolus. 2. Calcified coronary artery atherosclerosis. Mild cardiomegaly. No pericardial effusion. 3. Minimal pulmonary atelectasis. 4. Hepatic steatosis.     EKG:  EKG is ordered today, personally reviewed, demonstrating ***  Recent Labs: 07/15/2019: Hemoglobin 14.1; Platelets 233 03/25/2020: BUN 15; Creatinine, Ser 0.96; Potassium 4.3; Sodium 142  Recent Lipid Panel    Component Value Date/Time   CHOL 212 (H) 03/11/2020 0922   TRIG 452 (H) 03/11/2020 0922   HDL 37 (L) 03/11/2020 0922   CHOLHDL 5.7 (H) 03/11/2020 0922   CHOLHDL 6.9 11/08/2018 0527   VLDL UNABLE TO CALCULATE IF TRIGLYCERIDE OVER 400 mg/dL 11/08/2018 0527   LDLCALC 99 03/11/2020 0922   LDLDIRECT 78.4 11/08/2018 0527    PHYSICAL EXAM:    VS:  There were no vitals taken for this visit.  BMI: There is no height or weight on file to calculate BMI.  GEN: Well nourished, well developed, in no acute distress HEENT: normocephalic, atraumatic Neck: no JVD, carotid  bruits, or masses Cardiac: ***RRR; no murmurs, rubs, or gallops, no edema  Respiratory:  clear to auscultation bilaterally, normal work of breathing GI: soft, nontender, nondistended, + BS MS: no deformity or atrophy Skin: warm and dry, no rash Neuro:  Alert and Oriented x 3, Strength and sensation are intact, follows commands Psych: euthymic mood, full affect  Wt Readings from Last 3 Encounters:  04/28/20 (!) 306 lb 12.8 oz (139.2 kg)  04/13/20 (!) 302 lb (137 kg)  04/12/20 (!) 305 lb (138.3 kg)     ASSESSMENT & PLAN:   1. ***  Disposition: F/u with ***   Medication Adjustments/Labs and Tests Ordered: Current medicines are reviewed at length with the patient today.  Concerns regarding medicines are outlined above. Medication changes, Labs and Tests ordered today are summarized above and  listed in the Patient Instructions accessible in Encounters.    Signed, Charlie Pitter, PA-C  05/12/2020 9:54 AM    [facility] Location in [place], [place] [address] Ph: [phone]; Fax [phone]

## 2020-05-13 ENCOUNTER — Ambulatory Visit: Payer: BC Managed Care – PPO | Admitting: Physician Assistant

## 2020-05-17 ENCOUNTER — Telehealth: Payer: Self-pay | Admitting: Family Medicine

## 2020-05-17 DIAGNOSIS — G4733 Obstructive sleep apnea (adult) (pediatric): Secondary | ICD-10-CM

## 2020-05-17 NOTE — Telephone Encounter (Signed)
Received disability paperwork via fax, had not discussed filing for disability with patient prior to this and so called to discuss.  He reports he has been doing well since returning to work, just getting back into the groove of things.  He states that he just needs medical records from his initial FMLA leave to be sent to his HR office.  I informed him that as of current I will not be completing any disability paperwork as he has continued to improve clinically with no long-term impairment at this time.  During conversation he also reports that he received a letter in the mail that his CPAP machine was being recalled.  Stated that he had the [facility] respiratory care in should now use a bacterial filter.  Will place a DME order to see if this can be added or if needs to proceed with full new CPAP machine.   Instructed to have his HR office recent over a request for his records, however I will additionally check with our front staff to make sure that this did not already get sent out.  Patriciaann Clan, DO

## 2020-05-18 ENCOUNTER — Telehealth: Payer: Self-pay

## 2020-05-18 NOTE — Telephone Encounter (Signed)
Community message sent to [employer] for CPAP supplies.   Will await response.   Talbot Grumbling, RN

## 2020-05-19 NOTE — Telephone Encounter (Signed)
Below message received from [employer].    Received, Thank you!   Talbot Grumbling, RN

## 2020-05-24 ENCOUNTER — Encounter: Payer: Self-pay | Admitting: Gastroenterology

## 2020-06-10 NOTE — Progress Notes (Incomplete)
Cardiology Office Note   Date:  06/10/2020   ID:  Sujay Grundman, DOB 02-28-1971, MRN [id number]  PCP:  Patriciaann Clan, DO  Cardiologist:  Dr. Johnsie Cancel    No chief complaint on file.     History of Present Illness: Gerrod Maule is a 50 y.o. male who presents for follow up after stress test and EP visit.    He hashx of HTN, morbid obesity (BMI is 44)  o SOB initially felt to have bronchitis,no reported CP, palpitations, no cough, fever or symptoms of illness, neg COVID test,   Admitted to [facility] 11/07/2018 with acute onset of worsened SOB, CP, palpitations. In the ER noted to be in ST 150's treated initially with adenosine that had no effect, then IVF, ASA and NTG, rates improved and symptoms improved as well. CT chest negative for PE, his COVD test here negative, no obvious signs of infection, afebrile, WBC wnl. He also had a TSH that was wnl. CRP and sed rate elevated.  Hgb A1c 6.8.    Echo with LVEF 50-55%, no WMA described, +impaired relaxation, no pericardial effusion. Pt declined stress test secondary to bad past experience, body habitus and tachycardia precludes coronary CT  He was seen by Dr. Curt Bears and placed on dilt 240 mg daily.  Hydrate.   Suspected ST alone.  BB stopped due to weakness and fatigue.  Monitor 01/28/19 with NSR no arrhythmia average HR 89 bpm  Last visit 11/25/18 still felt bad.He had lexiscan myoview neg for ischemia, then had appt with Dr. Curt Bears  And dilt stopped and verapamil ER 360 started and losartan 50 mg daily.  Pt still with SOB so PFTs and pulmonary consult were made.     Patient offered right and left heart cath but declined PFTls reviewed 01/02/19 were normal with minimal small airway dx Getting his CPAP arranged through Dr Lamonte Sakai   He has multiple somatic complaints that all seem to be related to being out of shape and overweight Includes headaches, loss of appetite and muscle aches and pains     ***   Past Medical History:  Diagnosis Date   . Anxiety   . Blurry vision, bilateral 08/26/2019  . Coronary artery calcification   . Diabetes mellitus ([place])   . Dilatation of aorta ([facility])   . Hepatic steatosis   . HTN (hypertension)   . Hypertriglyceridemia   . Inappropriate sinus tachycardia   . Morbid obesity ([place])     Past Surgical History:  Procedure Laterality Date  . WISDOM TOOTH EXTRACTION Bilateral    2000     Current Outpatient Medications  Medication Sig Dispense Refill  . albuterol (VENTOLIN HFA) 108 (90 Base) MCG/ACT inhaler Inhale 1-2 puffs into the lungs every 6 (six) hours as needed for wheezing or shortness of breath.    . hydrOXYzine (ATARAX/VISTARIL) 10 MG tablet Take 1 tablet (10 mg total) by mouth 2 (two) times daily as needed for itching or anxiety. 30 tablet 0  . losartan (COZAAR) 100 MG tablet Take 1 tablet (100 mg total) by mouth daily. 90 tablet 3  . meloxicam (MOBIC) 7.5 MG tablet Take 1 tablet (7.5 mg total) by mouth daily. 10 tablet 0  . rosuvastatin (CRESTOR) 20 MG tablet Take 20 mg by mouth daily.    . sertraline (ZOLOFT) 25 MG tablet Take 1 tablet (25 mg total) by mouth daily. 30 tablet 1  . verapamil (CALAN-SR) 240 MG CR tablet Take 1 tablet (240 mg total) by mouth at  bedtime. 30 tablet 1   No current facility-administered medications for this visit.    Allergies:   Patient has no known allergies.    Social History:  The patient  reports that he has never smoked. He has never used smokeless tobacco. He reports that he does not drink alcohol and does not use drugs.   Family History:  The patient's family history includes Cancer in his father and mother; Diabetes in his father; Heart disease in his maternal grandfather and paternal grandfather.    ROS:  General:no colds or fevers, + weight increase  Skin:no rashes or ulcers HEENT:no blurred vision, no congestion CV:see HPI PUL:see HPI GI:no diarrhea constipation or melena, no indigestion GU:no hematuria, no dysuria, erectile dysfunction  new for pt  MS:no joint pain, no claudication Neuro:no syncope, no lightheadedness Endo:+ diabetes has not been treated, no PCP HgbA1C is 6.8 , no thyroid disease  Wt Readings from Last 3 Encounters:  04/28/20 (!) 139.2 kg  04/13/20 (!) 137 kg  04/12/20 (!) 138.3 kg     PHYSICAL EXAM: VS:  There were no vitals taken for this visit. , BMI There is no height or weight on file to calculate BMI.  Affect appropriate Overweight black male  HEENT: normal Neck supple with no adenopathy JVP normal no bruits no thyromegaly Lungs clear with no wheezing and good diaphragmatic motion Heart:  S1/S2 no murmur, no rub, gallop or click PMI normal Abdomen: benighn, BS positve, no tenderness, no AAA no bruit.  No HSM or HJR Distal pulses intact with no bruits No edema Neuro non-focal Skin warm and dry No muscular weakness    EKG:  EKG is ordered today. The ekg ordered today demonstrates ST at 107 no acute ST changes    Recent Labs: 07/15/2019: Hemoglobin 14.1; Platelets 233 03/25/2020: BUN 15; Creatinine, Ser 0.96; Potassium 4.3; Sodium 142    Lipid Panel    Component Value Date/Time   CHOL 212 (H) 03/11/2020 0922   TRIG 452 (H) 03/11/2020 0922   HDL 37 (L) 03/11/2020 0922   CHOLHDL 5.7 (H) 03/11/2020 0922   CHOLHDL 6.9 11/08/2018 0527   VLDL UNABLE TO CALCULATE IF TRIGLYCERIDE OVER 400 mg/dL 11/08/2018 0527   LDLCALC 99 03/11/2020 0922   LDLDIRECT 78.4 11/08/2018 0527       Other studies Reviewed: Additional studies/ records that were reviewed today include: . Echo 11/07/18 IMPRESSIONS    1. The left ventricle has low normal systolic function, with an ejection fraction of 50-55%. The cavity size was normal. There is mildly increased left ventricular wall thickness. Left ventricular diastolic Doppler parameters are consistent with  impaired relaxation.  2. The right ventricle has normal systolic function. The cavity was normal. There is no increase in right ventricular  wall thickness.  3. The aortic valve was not well visualized.  FINDINGS  Left Ventricle: The left ventricle has low normal systolic function, with an ejection fraction of 50-55%. The cavity size was normal. There is mildly increased left ventricular wall thickness. Left ventricular diastolic Doppler parameters are consistent  with impaired relaxation.  Right Ventricle: The right ventricle has normal systolic function. The cavity was normal. There is no increase in right ventricular wall thickness.  Left Atrium: Left atrial size was normal in size.  Right Atrium: Right atrial size was normal in size. Right atrial pressure is estimated at 10 mmHg.  Interatrial Septum: No atrial level shunt detected by color flow Doppler.  Pericardium: There is no evidence of pericardial effusion.  Mitral Valve: The mitral valve was not well visualized. Mitral valve regurgitation is not visualized by color flow Doppler.  Tricuspid Valve: The tricuspid valve is normal in structure. Tricuspid valve regurgitation was not visualized by color flow Doppler.  Aortic Valve: The aortic valve was not well visualized Aortic valve regurgitation was not visualized by color flow Doppler. There is no evidence of aortic valve stenosis.  Pulmonic Valve: The pulmonic valve was normal in structure. Pulmonic valve regurgitation is not visualized by color flow Doppler.  Venous: The inferior vena cava is normal in size with greater than 50% respiratory variability.  nuc study 11/27/18   Study Highlights    Nuclear stress EF: 37%. Generalized hypokinesis noted. Dilated left ventricle.  There was no ST segment deviation noted during stress. There was no transient ischemic dilatation.  This is an intermediate risk study based upon reduction in ejection fraction. There were no perfusion defects identified suggestive of nonischemic cardiomyopathy.        ASSESSMENT AND PLAN:  1.  Chest pain with neg lexiscan  myoview done 11/27/18  Observe   2.  Dyspnea:  Non cardiac EF 50-55% by echo no valve disease or evidence of pulmonary HTN BNP was normal on 12/19/18  CTA negative for PE on 11/07/18 PFTls ok see below f/u echo in a year   3.  HTN on ACE and diuretic improved f/u primary   4.   CKD 2 need to monitor with ARB Cr is 0.96 03/25/20   5.  DM-2 Discussed low carb diet.  Target hemoglobin A1c is 6.5 or less.  Continue current medications.   6.  Tachycardia improved *** today followed by Dr. Curt Bears   7.  OSA:  Start CPAP per Dr Lamonte Sakai  8. Aorta:  Sinus measures 4.0 cm on TTE 03/01/20 f/u CTA October 2022    Disposition:   FU:   In a year  Echo f/u EF October 2022   Signed, Jenkins Rouge, MD  06/10/2020 9:18 AM    [place], [place], [place] Phone: [phone]; Fax: [phone]

## 2020-06-18 ENCOUNTER — Ambulatory Visit: Payer: BC Managed Care – PPO | Admitting: Cardiovascular Disease

## 2020-06-19 ENCOUNTER — Encounter: Payer: Self-pay | Admitting: Family Medicine

## 2020-06-21 ENCOUNTER — Other Ambulatory Visit: Payer: Self-pay

## 2020-06-21 ENCOUNTER — Ambulatory Visit (AMBULATORY_SURGERY_CENTER): Payer: Self-pay | Admitting: *Deleted

## 2020-06-21 ENCOUNTER — Other Ambulatory Visit: Payer: Self-pay | Admitting: *Deleted

## 2020-06-21 ENCOUNTER — Telehealth: Payer: Self-pay | Admitting: Gastroenterology

## 2020-06-21 ENCOUNTER — Other Ambulatory Visit: Payer: Self-pay | Admitting: Gastroenterology

## 2020-06-21 VITALS — Ht 69.0 in | Wt 310.0 lb

## 2020-06-21 DIAGNOSIS — Z800 Family history of malignant neoplasm of digestive organs: Secondary | ICD-10-CM

## 2020-06-21 MED ORDER — PEG 3350-KCL-NA BICARB-NACL 420 G PO SOLR: 4000.0000 mL | Freq: Once | ORAL | 0 refills | Status: DC

## 2020-06-21 NOTE — Progress Notes (Signed)

## 2020-06-21 NOTE — Telephone Encounter (Signed)
[facility] is requesting a call back from a nurse to see if the pt could be given Gavalyte G instead of what was prescribed NULYTELY

## 2020-06-21 NOTE — Telephone Encounter (Signed)
Called [facility] and explained that we could use the Gavalyte G.

## 2020-06-22 MED ORDER — ROSUVASTATIN CALCIUM 20 MG PO TABS: 20.0000 mg | ORAL_TABLET | Freq: Every day | ORAL | 1 refills | Status: DC

## 2020-06-22 NOTE — Progress Notes (Signed)
Cardiology Office Note   Date:  06/28/2020   ID:  Rodney Lane, DOB Oct 22, 1970, MRN [id number]  PCP:  Rodney Clan, DO Cardiologist:  Rodney Rouge, MD 02/26/2019 Electrphysiologist: Rodney Haw, MD 12/05/2018 Rodney Ferries, PA-C  Rodney Lane, [facility] 04/12/2020  No chief complaint on file.   History of Present Illness: Rodney Lane is a 50 y.o. male with a history of CP, HTN, HLD (triglycerides), ST, borderline DM, GERD, morbid obesity, OSA on CPAP, anxiety/depression  Admitted 10/2018 w/ CP, SOB, palpitations.  EF was normal, CT negative for PE, other testing normal, diltiazem added by Dr. Curt Bears, Myoview negative for ischemia.  Diltiazem changed to verapamil and losartan added, pulmonary saw due to shortness of breath, PFTs showed minimal small airway disease Office visit 04/12/2020, losartan increased to 100 mg daily, upper extremity tingling concerning for nerve impingement.  CTA chest recommended for 02/2021 to follow aortic root dilatation, previous Myoview and monitor results are below  Rodney Lane presents for 1 month f/u  He has appt with Neurologist in February, hopes they will be able to help his back.  He has numbness that starts in his lower back and right hip, and goes into his R thigh, occurs while sitting or standing. Not prolonged, lasts 4-5 min, helped by moving around and rubbing it. No weakness. Numbness does not go below his knee.  He is not having CP very often, it generally occurs when BP is high.  Walks a lot at work, never gets chest pain with exertion.  He works nights, uses machinery and walks a great deal. However, on off days, does not do much.   He is compliant w/ CPAP, denies DOE, LE edema, orthopnea or PND.   Aware HR is high, rarely drinks caffeine, no OTC meds to explain it.  TSH checked in 2020 was normal.  Probs w/ ED, had rx for Cialis, technical problems, never got it filled.  He would like something else.   Crestor costs him  $85/month, he would like something cheaper.  Was on HCTZ in 2020, d/c'd due to Cr rise. Was on metoprolol in 2020, d/c'd secondary to weakness and fatigue   COVID status: vaccinated, did not have COVID Past Medical History:  Diagnosis Date  . Allergy    thru the year   . Anxiety   . Blurry vision, bilateral 08/26/2019  . Coronary artery calcification   . Diabetes mellitus (HCC)    borderline- no meds   . Dilatation of aorta (HCC)   . GERD (gastroesophageal reflux disease)    past hx- not current   . Hepatic steatosis   . HTN (hypertension)   . Hypertriglyceridemia   . Inappropriate sinus tachycardia   . Morbid obesity ([place])   . Sleep apnea    wears cpap nightly     Past Surgical History:  Procedure Laterality Date  . COLONOSCOPY     2 past colons- 1 civilian, 1 military - normal x 2 per pt   . WISDOM TOOTH EXTRACTION Bilateral    2000    Current Outpatient Medications  Medication Sig Dispense Refill  . GAVILYTE-G 236 g solution SMARTSIG:Milliliter(s) By Mouth    . losartan (COZAAR) 100 MG tablet Take 1 tablet (100 mg total) by mouth daily. 90 tablet 3  . rosuvastatin (CRESTOR) 20 MG tablet Take 1 tablet (20 mg total) by mouth daily. 90 tablet 1  . verapamil (CALAN-SR) 240 MG CR tablet Take 1 tablet (240 mg total) by mouth  at bedtime. 30 tablet 1   No current facility-administered medications for this visit.    Allergies:   Patient has no known allergies.    Social History:  The patient  reports that he has never smoked. He has never used smokeless tobacco. He reports that he does not drink alcohol and does not use drugs.   Family History:  The patient's family history includes Cancer in his father and mother; Colon cancer in his father; Diabetes in his father; Heart disease in his maternal grandfather and paternal grandfather.  He indicated that his mother is alive. He indicated that his father is deceased. He indicated that his maternal grandmother is deceased. He  indicated that his maternal grandfather is deceased. He indicated that his paternal grandmother is deceased. He indicated that his paternal grandfather is deceased. He indicated that the status of his neg hx is unknown.    ROS:  Please see the history of present illness. All other systems are reviewed and negative.    PHYSICAL EXAM: VS:  BP (!) 142/90   Pulse (!) 102   Ht 5\' 9"  (1.753 m)   Wt (!) 314 lb 9.6 oz (142.7 kg)   SpO2 97%   BMI 46.46 kg/m  , BMI Body mass index is 46.46 kg/m. GEN: Well nourished, well developed, male in no acute distress HEENT: normal for age  Neck: no JVD, no carotid bruit, no masses Cardiac: RRR; no murmur, no rubs, or gallops Respiratory:  clear to auscultation bilaterally, normal work of breathing GI: soft, nontender, nondistended, + BS MS: no deformity or atrophy; no edema; distal pulses are 2+ in all 4 extremities  Skin: warm and dry, no rash Neuro:  Strength and sensation are intact Psych: euthymic mood, full affect   EKG:  EKG is not ordered today.  ECHO: 03/01/2020 1. Left ventricular ejection fraction, by estimation, is 60 to 65%. Left  ventricular ejection fraction by 3D volume is 61 %. Left ventricular  ejection fraction by PLAX is 65 %. The left ventricle has normal function.  The left ventricle has no regional wall motion abnormalities. Left ventricular diastolic parameters are consistent with Grade II diastolic dysfunction (pseudonormalization).  2. Right ventricular systolic function is normal. The right ventricular  size is normal. There is normal pulmonary artery systolic pressure.  3. The mitral valve is normal in structure. No evidence of mitral valve  regurgitation. No evidence of mitral stenosis.  4. The aortic valve is tricuspid. Aortic valve regurgitation is not  visualized. No aortic stenosis is present.  5. Aortic dilatation noted. 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  There is mild dilatation of the ascending aorta, measuring 38 mm.  6. The inferior vena cava is normal in size with greater than 50%  respiratory variability, suggesting right atrial pressure of 3 mmHg.   MYOVIEW: 11/27/2018  Nuclear stress EF: 37%. Generalized hypokinesis noted. Dilated left ventricle.  There was no ST segment deviation noted during stress. There was no transient ischemic dilatation.  This is an intermediate risk study based upon reduction in ejection fraction. There were no perfusion defects identified suggestive of nonischemic cardiomyopathy.  MONITOR: 01/28/2019 Overall Max 174 bpm 11:24am, 08/21 Min 49 bpm 05:16am, 08/19 Avg 89 bpm Sinus Same HR as Overall  CT PE protocol: 11/07/2018 IMPRESSION: 1. Negative for acute pulmonary embolus. 2. Calcified coronary artery atherosclerosis. Mild cardiomegaly. No pericardial effusion. 3. Minimal pulmonary atelectasis. 4. Hepatic steatosis.  Recent  Labs: 07/15/2019: Hemoglobin 14.1; Platelets 233 03/25/2020: BUN 15; Creatinine, Ser 0.96; Potassium 4.3; Sodium 142  CBC    Component Value Date/Time   WBC 7.7 07/15/2019 1534   RBC 4.63 07/15/2019 1534   HGB 14.1 07/15/2019 1534   HCT 43.4 07/15/2019 1534   PLT 233 07/15/2019 1534   MCV 93.7 07/15/2019 1534   MCH 30.5 07/15/2019 1534   MCHC 32.5 07/15/2019 1534   RDW 12.9 07/15/2019 1534   CMP Latest Ref Rng & Units 03/25/2020 03/11/2020 07/15/2019  Glucose 65 - 99 mg/dL 97 118(H) 112(H)  BUN 6 - 24 mg/dL 15 14 13   Creatinine 0.76 - 1.27 mg/dL 0.96 0.93 1.13  Sodium 134 - 144 mmol/L 142 140 138  Potassium 3.5 - 5.2 mmol/L 4.3 3.9 4.1  Chloride 96 - 106 mmol/L 102 102 102  CO2 20 - 29 mmol/L 26 24 24   Calcium 8.7 - 10.2 mg/dL 10.1 9.7 9.5  Total Protein 6.0 - 8.5 g/dL - - -  Total Bilirubin 0.0 - 1.2 mg/dL - - -  Alkaline Phos 39 - 117 IU/L - - -  AST 0 - 40 IU/L - - -  ALT 0 - 44 IU/L - - -   Lab Results  Component Value Date   TSH 2.400 11/06/2018    Lipid Panel Lab Results  Component Value Date   CHOL 212 (H) 03/11/2020   HDL 37 (L) 03/11/2020   LDLCALC 99 03/11/2020   LDLDIRECT 78.4 11/08/2018   TRIG 452 (H) 03/11/2020   CHOLHDL 5.7 (H) 03/11/2020   Lab Results  Component Value Date   HGBA1C 6.1 03/11/2020     Wt Readings from Last 3 Encounters:  06/28/20 (!) 314 lb 9.6 oz (142.7 kg)  06/21/20 (!) 310 lb (140.6 kg)  04/28/20 (!) 306 lb 12.8 oz (139.2 kg)     Other studies Reviewed: Additional studies/ records that were reviewed today include: Office notes, hospital records and testing.  ASSESSMENT AND PLAN:  1.  Hypertension: -He checks his blood pressure daily.  The lowest 1 is 122/79, the highest is [id number]. -Most diastolic values are mid 0000000 and above -Systolic is closer to being controlled, range 122-155 with most values being in the 130s and 140s. -He is compliant with the losartan 100 mg a day and verapamil 240 mg a day -Did not tolerate metoprolol with weakness and fatigue -Was on HCT 25 mg daily in the past, he stopped it prior to 03/11/2020 PCP visit, just because he ran out -In 2020, creatinine was as high as 1.51, now improved.  Will avoid diuretic for now -Add clonidine 0.1 mg twice daily and see how tolerated. -He is to continue to chart his blood pressures and let us know if he tolerates the medication and if his blood pressure is better controlled. - can uptitrate thru Mychart if needed  2.  Sinus tachycardia: -His heart rate is never low.  His heart rate goes up in the 120s with minimal activity -During at least one of his home blood pressure checks, his heart rate was 138 -Drinks very few caffeinated beverages -Denies over-the-counter medications such as decongestants or weight loss pills -Feels that he stays well-hydrated, drinking mostly water and juice -TSH was normal when last checked -No apparent cause for elevated HR, verapamil helps but heart rate is still not well controlled -No  significant respiratory issues, has been negative for PE in the past -Continue to follow  3.  Hyperlipidemia: -He had calcified atherosclerosis on  his chest CT PE protocol, so goal LDL is less than 70 -Offered nutritional counseling but the patient says he knows what he needs to do. -See if Lipitor 40 mg is less expensive for him then Crestor 20 mg -Check CMET and lipid profile today as he has eaten very little -If the Lipitor is well-tolerated and more affordable, recheck labs in 3 months  4.  Thigh numbness: -He has good distal pulses, do not believe this is from claudication -Explained that his symptoms are more consistent with radicular pain from his back -He is encouraged to keep his appointment with neurology  5.  ED: -Says there were technical issues with getting the Cialis filled so he never did -He was on sildenafil at 1 point, seem to tolerate that well, it was changed to tadalafil for unclear reasons but that was never filled -Prescription given for sildenafil  Current medicines are reviewed at length with the patient today.  The patient has concerns regarding medicines.  The following changes have been made: Add clonidine, change Crestor to Lipitor, add Viagra  Labs/ tests ordered today include:  No orders of the defined types were placed in this encounter.    Disposition:   FU with Rodney Rouge, MD  Signed, Rodney Ferries, PA-C  06/28/2020 1:21 PM    [facility] Phone: [phone]; Fax: [phone]

## 2020-06-28 ENCOUNTER — Ambulatory Visit (INDEPENDENT_AMBULATORY_CARE_PROVIDER_SITE_OTHER): Payer: BC Managed Care – PPO | Admitting: Physician Assistant

## 2020-06-28 ENCOUNTER — Encounter: Payer: Self-pay | Admitting: Diagnostic Neuroimaging

## 2020-06-28 ENCOUNTER — Other Ambulatory Visit (HOSPITAL_COMMUNITY)
Admission: RE | Admit: 2020-06-28 | Discharge: 2020-06-28 | Disposition: A | Discharge status: Home / Self Care | Payer: BC Managed Care – PPO | Source: Ambulatory Visit | Attending: Physician Assistant | Admitting: Physician Assistant

## 2020-06-28 ENCOUNTER — Encounter: Payer: Self-pay | Admitting: Physician Assistant

## 2020-06-28 ENCOUNTER — Other Ambulatory Visit: Payer: Self-pay

## 2020-06-28 ENCOUNTER — Ambulatory Visit: Payer: BC Managed Care – PPO | Admitting: Diagnostic Neuroimaging

## 2020-06-28 VITALS — BP 142/90 | HR 102 | Ht 69.0 in | Wt 314.6 lb

## 2020-06-28 DIAGNOSIS — N529 Male erectile dysfunction, unspecified: Secondary | ICD-10-CM

## 2020-06-28 DIAGNOSIS — E782 Mixed hyperlipidemia: Secondary | ICD-10-CM | POA: Insufficient documentation

## 2020-06-28 DIAGNOSIS — R000 Tachycardia, unspecified: Secondary | ICD-10-CM

## 2020-06-28 DIAGNOSIS — I10 Essential (primary) hypertension: Secondary | ICD-10-CM | POA: Diagnosis not present

## 2020-06-28 LAB — COMPREHENSIVE METABOLIC PANEL
ALT: 36 U/L (ref 0–44)
AST: 27 U/L (ref 15–41)
Albumin: 4.5 g/dL (ref 3.5–5.0)
Alkaline Phosphatase: 47 U/L (ref 38–126)
Anion gap: 12 (ref 5–15)
BUN: 15 mg/dL (ref 6–20)
CO2: 25 mmol/L (ref 22–32)
Calcium: 9.5 mg/dL (ref 8.9–10.3)
Chloride: 102 mmol/L (ref 98–111)
Creatinine, Ser: 1.15 mg/dL (ref 0.61–1.24)
GFR, Estimated: >60 mL/min (ref >60)
Glucose, Bld: 122 mg/dL — ABNORMAL HIGH (ref 70–99)
Potassium: 4.3 mmol/L (ref 3.5–5.1)
Sodium: 139 mmol/L (ref 135–145)
Total Bilirubin: 0.6 mg/dL (ref 0.3–1.2)
Total Protein: 7.8 g/dL (ref 6.5–8.1)

## 2020-06-28 LAB — LIPID PANEL
Cholesterol: 185 mg/dL (ref 0–200)
HDL: 40 mg/dL — ABNORMAL LOW (ref >40)
LDL Cholesterol: UNDETERMINED mg/dL (ref 0–99)
Total CHOL/HDL Ratio: 4.6 RATIO
Triglycerides: 997 mg/dL — ABNORMAL HIGH (ref <150)
VLDL: UNDETERMINED mg/dL (ref 0–40)

## 2020-06-28 MED ORDER — SILDENAFIL CITRATE 20 MG PO TABS: ORAL_TABLET | ORAL | 2 refills | Status: DC

## 2020-06-28 MED ORDER — CLONIDINE HCL 0.1 MG PO TABS: 0.1000 mg | ORAL_TABLET | Freq: Two times a day (BID) | ORAL | 3 refills | Status: DC

## 2020-06-28 MED ORDER — ATORVASTATIN CALCIUM 40 MG PO TABS: 40.0000 mg | ORAL_TABLET | Freq: Every day | ORAL | 3 refills | Status: DC

## 2020-06-28 NOTE — Patient Instructions (Addendum)
Medication Instructions:  STOP Crestor  START Lipitor 40 mg  Sildenafil 20 mg  Clonidine 0.1 mg   *If you need a refill on your cardiac medications before your next appointment, please call your pharmacy*   Lab Work: CMET Lipid Panel  If you have labs (blood work) drawn today and your tests are completely normal, you will receive your results only by: Marland Kitchen MyChart Message (if you have MyChart) OR . A paper copy in the mail If you have any lab test that is abnormal or we need to change your treatment, we will call you to review the results.   Testing/Procedures: None Today   Follow-Up: At [facility], you and your health needs are our priority.  As part of our continuing mission to provide you with exceptional heart care, we have created designated Provider Care Teams.  These Care Teams include your primary Cardiologist (physician) and Advanced Practice Providers (APPs -  Physician Assistants and Nurse Practitioners) who all work together to provide you with the care you need, when you need it.  We recommend signing up for the patient portal called "MyChart".  Sign up information is provided on this After Visit Summary.  MyChart is used to connect with patients for Virtual Visits (Telemedicine).  Patients are able to view lab/test results, encounter notes, upcoming appointments, etc.  Non-urgent messages can be sent to your provider as well.   To learn more about what you can do with MyChart, go to [email or web address].    Your next appointment:   Keep your follow up appointment with Dr. Edmonia James in March.  Other Instructions Keep track of B/P and H/R

## 2020-06-29 ENCOUNTER — Encounter: Payer: Self-pay | Admitting: Gastroenterology

## 2020-06-29 ENCOUNTER — Encounter: Payer: Self-pay | Admitting: Diagnostic Neuroimaging

## 2020-06-29 LAB — LDL CHOLESTEROL, DIRECT: Direct LDL: 70.6 mg/dL (ref 0–99)

## 2020-06-30 ENCOUNTER — Encounter: Payer: Self-pay | Admitting: Family Medicine

## 2020-07-01 ENCOUNTER — Other Ambulatory Visit: Payer: Self-pay | Admitting: Family Medicine

## 2020-07-01 DIAGNOSIS — R000 Tachycardia, unspecified: Secondary | ICD-10-CM

## 2020-07-01 MED ORDER — VERAPAMIL HCL ER 240 MG PO TBCR: 240.0000 mg | EXTENDED_RELEASE_TABLET | Freq: Every day | ORAL | 2 refills | Status: DC

## 2020-07-02 ENCOUNTER — Other Ambulatory Visit: Payer: Self-pay | Admitting: Physician Assistant

## 2020-07-02 DIAGNOSIS — E781 Pure hyperglyceridemia: Secondary | ICD-10-CM

## 2020-07-05 ENCOUNTER — Encounter: Payer: Self-pay | Admitting: Gastroenterology

## 2020-07-05 ENCOUNTER — Ambulatory Visit (AMBULATORY_SURGERY_CENTER): Payer: BC Managed Care – PPO | Admitting: Gastroenterology

## 2020-07-05 ENCOUNTER — Other Ambulatory Visit: Payer: Self-pay

## 2020-07-05 VITALS — BP 140/92 | HR 92 | Temp 98.5°F | Resp 18 | Ht 69.0 in | Wt 310.0 lb

## 2020-07-05 DIAGNOSIS — Z1211 Encounter for screening for malignant neoplasm of colon: Secondary | ICD-10-CM | POA: Diagnosis not present

## 2020-07-05 DIAGNOSIS — D125 Benign neoplasm of sigmoid colon: Secondary | ICD-10-CM

## 2020-07-05 DIAGNOSIS — Z800 Family history of malignant neoplasm of digestive organs: Secondary | ICD-10-CM | POA: Diagnosis not present

## 2020-07-05 DIAGNOSIS — D120 Benign neoplasm of cecum: Secondary | ICD-10-CM | POA: Diagnosis not present

## 2020-07-05 MED ORDER — SODIUM CHLORIDE 0.9 % IV SOLN
500.0000 mL | Freq: Once | INTRAVENOUS | Status: DC
Start: 2020-07-05 — End: 2020-07-05

## 2020-07-05 NOTE — Patient Instructions (Signed)
Handout given for polyps.  YOU HAD AN ENDOSCOPIC PROCEDURE TODAY AT [facility]:   Refer to the procedure report that was given to you for any specific questions about what was found during the examination.  If the procedure report does not answer your questions, please call your gastroenterologist to clarify.  If you requested that your care partner not be given the details of your procedure findings, then the procedure report has been included in a sealed envelope for you to review at your convenience later.  YOU SHOULD EXPECT: Some feelings of bloating in the abdomen. Passage of more gas than usual.  Walking can help get rid of the air that was put into your GI tract during the procedure and reduce the bloating. If you had a lower endoscopy (such as a colonoscopy or flexible sigmoidoscopy) you may notice spotting of blood in your stool or on the toilet paper. If you underwent a bowel prep for your procedure, you may not have a normal bowel movement for a few days.  Please Note:  You might notice some irritation and congestion in your nose or some drainage.  This is from the oxygen used during your procedure.  There is no need for concern and it should clear up in a day or so.  SYMPTOMS TO REPORT IMMEDIATELY:   Following lower endoscopy (colonoscopy or flexible sigmoidoscopy):  Excessive amounts of blood in the stool  Significant tenderness or worsening of abdominal pains  Swelling of the abdomen that is new, acute  Fever of 100F or higher  For urgent or emergent issues, a gastroenterologist can be reached at any hour by calling [phone]. Do not use MyChart messaging for urgent concerns.    DIET:  We do recommend a small meal at first, but then you may proceed to your regular diet.  Drink plenty of fluids but you should avoid alcoholic beverages for 24 hours.  ACTIVITY:  You should plan to take it easy for the rest of today and you should NOT DRIVE or work and no use  of heavy machinery until tomorrow (because of the sedation medicines used during the test).    FOLLOW UP: Our staff will call the number listed on your records Wednesday 2/9 around 715 AM following your procedure to check on you and address any questions or concerns that you may have regarding the information given to you following your procedure. If we do not reach you, we will leave a message.  We will attempt to reach you two times.  During this call, we will ask if you have developed any symptoms of COVID 19. If you develop any symptoms (ie: fever, flu-like symptoms, shortness of breath, cough etc.) before then, please call [phone].  If you test positive for Covid 19 in the 2 weeks post procedure, please call and report this information to [place].    If any biopsies were taken you will be contacted by phone or by letter within the next 1-3 weeks.  Please call us at [phone] if you have not heard about the biopsies in 3 weeks.    SIGNATURES/CONFIDENTIALITY: You and/or your care partner have signed paperwork which will be entered into your electronic medical record.  These signatures attest to the fact that that the information above on your After Visit Summary has been reviewed and is understood.  Full responsibility of the confidentiality of this discharge information lies with you and/or your care-partner.

## 2020-07-05 NOTE — Progress Notes (Signed)
Pt's states no medical or surgical changes since previsit or office visit. VS by CW. 

## 2020-07-05 NOTE — Op Note (Signed)
[facility] Patient Name: Rodney Lane Procedure Date: 07/05/2020 7:56 AM MRN: [id number] Endoscopist: Milus Banister , MD Age: 50 Referring MD:  Date of Birth: [birth year] Gender: Male Account #: [id number] Procedure:                Colonoscopy Indications:              Screening in patient at increased risk: Family                            history of 1st-degree relative with colorectal                            cancer before age 70 years (father diagnosed in his                            22s) Medicines:                Monitored Anesthesia Care Procedure:                Pre-Anesthesia Assessment:                           - Prior to the procedure, a History and Physical                            was performed, and patient medications and                            allergies were reviewed. The patient's tolerance of                            previous anesthesia was also reviewed. The risks                            and benefits of the procedure and the sedation                            options and risks were discussed with the patient.                            All questions were answered, and informed consent                            was obtained. Prior Anticoagulants: The patient has                            taken no previous anticoagulant or antiplatelet                            agents. ASA Grade Assessment: III - A patient with                            severe systemic disease. After reviewing the risks  and benefits, the patient was deemed in                            satisfactory condition to undergo the procedure.                           After obtaining informed consent, the colonoscope                            was passed under direct vision. Throughout the                            procedure, the patient's blood pressure, pulse, and                            oxygen saturations were monitored continuously. The                             Olympus CF-HQ190L [id number]) Colonoscope was                            introduced through the anus and advanced to the the                            cecum, identified by appendiceal orifice and                            ileocecal valve. The colonoscopy was performed                            without difficulty. The patient tolerated the                            procedure well. The quality of the bowel                            preparation was good. The ileocecal valve,                            appendiceal orifice, and rectum were photographed. Scope In: 8:03:50 AM Scope Out: 8:15:20 AM Scope Withdrawal Time: 0 hours 9 minutes 59 seconds  Total Procedure Duration: 0 hours 11 minutes 30 seconds  Findings:                 Four sessile polyps were found in the sigmoid colon                            and cecum. The polyps were 2 to 7 mm in size. These                            polyps were removed with a cold snare. Resection                            and retrieval were complete.  The exam was otherwise without abnormality on                            direct and retroflexion views. Complications:            No immediate complications. Estimated blood loss:                            None. Estimated Blood Loss:     Estimated blood loss: none. Impression:               - Four 2 to 7 mm polyps in the sigmoid colon and in                            the cecum, removed with a cold snare. Resected and                            retrieved.                           - The examination was otherwise normal on direct                            and retroflexion views. Recommendation:           - Patient has a contact number available for                            emergencies. The signs and symptoms of potential                            delayed complications were discussed with the                            patient. Return to normal activities  tomorrow.                            Written discharge instructions were provided to the                            patient.                           - Resume previous diet.                           - Continue present medications.                           - Await pathology results. Milus Banister, MD 07/05/2020 8:20:09 AM This report has been signed electronically.

## 2020-07-05 NOTE — Progress Notes (Signed)
PT taken to PACU. Monitors in place. VSS. Report given to RN. 

## 2020-07-05 NOTE — Progress Notes (Signed)
Called to room to assist during endoscopic procedure.  Patient ID and intended procedure confirmed with present staff. Received instructions for my participation in the procedure from the performing physician.  

## 2020-07-07 ENCOUNTER — Telehealth: Payer: Self-pay

## 2020-07-07 NOTE — Telephone Encounter (Signed)
  Follow up Call-  Call back number 07/05/2020  Post procedure Call Back phone  # [phone]  Permission to leave phone message Yes     Patient questions:  Do you have a fever, pain , or abdominal swelling? No. Pain Score  0 *  Have you tolerated food without any problems? Yes.    Have you been able to return to your normal activities? Yes.    Do you have any questions about your discharge instructions: Diet   No. Medications  No. Follow up visit  No.  Do you have questions or concerns about your Care? No.  Actions: * If pain score is 4 or above: No action needed, pain <4.  1. Have you developed a fever since your procedure? no  2.   Have you had an respiratory symptoms (SOB or cough) since your procedure? no  3.   Have you tested positive for COVID 19 since your procedure no  4.   Have you had any family members/close contacts diagnosed with the COVID 19 since your procedure?  no   If yes to any of these questions please route to Joylene Lamarco, RN and Joella Prince, RN

## 2020-07-13 ENCOUNTER — Encounter: Payer: Self-pay | Admitting: Gastroenterology

## 2020-07-19 ENCOUNTER — Encounter: Payer: Self-pay | Admitting: Diagnostic Neuroimaging

## 2020-07-19 ENCOUNTER — Ambulatory Visit (INDEPENDENT_AMBULATORY_CARE_PROVIDER_SITE_OTHER): Payer: BC Managed Care – PPO | Admitting: Diagnostic Neuroimaging

## 2020-07-19 VITALS — BP 141/80 | HR 96 | Ht 69.0 in | Wt 312.0 lb

## 2020-07-19 DIAGNOSIS — G43109 Migraine with aura, not intractable, without status migrainosus: Secondary | ICD-10-CM

## 2020-07-19 MED ORDER — TOPIRAMATE 50 MG PO TABS: 50.0000 mg | ORAL_TABLET | Freq: Two times a day (BID) | ORAL | 12 refills | Status: DC

## 2020-07-19 MED ORDER — RIZATRIPTAN BENZOATE 10 MG PO TBDP: 10.0000 mg | ORAL_TABLET | ORAL | 11 refills | Status: DC | PRN

## 2020-07-19 NOTE — Progress Notes (Signed)
[facility]  PATIENT: Rodney Lane DOB: 1971-02-12  REFERRING CLINICIAN: Patriciaann Clan, DO HISTORY FROM: patient  REASON FOR VISIT: new consult    HISTORICAL  CHIEF COMPLAINT:  Chief Complaint  Patient presents with  . Bilateral arm tingling, weakness    Rm 7 New Pt  "basically pain, weakness, numbness in whole right side that starts in hip down through leg; history of back issues with spinal injections; no issue when asleep or sitting-mostly with standing"    HISTORY OF PRESENT ILLNESS:   50 year old male with headaches, arm pain, leg pain.  Patient reports occipital and right-sided headaches with dizziness, nausea, photophobia and blurred vision for past 5 years, 3-4 times a month.  Patient has family history of migraine in mother.  He has never been officially diagnosed with migraine.  He takes over-the-counter medications with mild relief.  Patient also having intermittent shooting pains and tingling sensation in right greater than left arms.  Also has right hip pain, numbness in the right thigh, with chronic low back pain since 1993.  Patient also has borderline diabetes, hypertension, hypertriglyceridemia, sleep apnea, obesity, anxiety and depression.    REVIEW OF SYSTEMS: Full 14 system review of systems performed and negative with exception of: as per hPI.  ALLERGIES: No Known Allergies  HOME MEDICATIONS: Outpatient Medications Prior to Visit  Medication Sig Dispense Refill  . atorvastatin (LIPITOR) 40 MG tablet Take 1 tablet (40 mg total) by mouth daily. 90 tablet 3  . cloNIDine (CATAPRES) 0.1 MG tablet Take 1 tablet (0.1 mg total) by mouth 2 (two) times daily. 180 tablet 3  . losartan (COZAAR) 100 MG tablet Take 1 tablet (100 mg total) by mouth daily. 90 tablet 3  . rosuvastatin (CRESTOR) 20 MG tablet Take 1 tablet (20 mg total) by mouth daily. 90 tablet 1  . sertraline (ZOLOFT) 25 MG tablet Take 25 mg by mouth daily.    . sildenafil  (REVATIO) 20 MG tablet Take 3-5 tablets as needed. 60 tablet 2  . verapamil (CALAN-SR) 240 MG CR tablet Take 1 tablet (240 mg total) by mouth at bedtime. 90 tablet 2   No facility-administered medications prior to visit.    PAST MEDICAL HISTORY: Past Medical History:  Diagnosis Date  . Allergy    thru the year   . Anxiety   . Blurry vision, bilateral 08/26/2019  . Coronary artery calcification   . Diabetes mellitus (HCC)    borderline- no meds   . Dilatation of aorta (HCC)   . GERD (gastroesophageal reflux disease)    past hx- not current   . Hepatic steatosis   . HTN (hypertension)   . Hypertriglyceridemia   . Inappropriate sinus tachycardia   . Morbid obesity ([place])   . Sleep apnea    wears cpap nightly     PAST SURGICAL HISTORY: Past Surgical History:  Procedure Laterality Date  . COLONOSCOPY     2 past colons- 1 civilian, 1 military - normal x 2 per pt   . WISDOM TOOTH EXTRACTION Bilateral    2000    FAMILY HISTORY: Family History  Problem Relation Age of Onset  . Cancer Mother   . Cancer Father   . Diabetes Father   . Colon cancer Father        dx'd in his 3's , died in 32's   . Heart disease Maternal Grandfather        diagnosed 68s  . Heart disease Paternal Grandfather  diagnosed 57s  . Colon polyps Neg Hx   . Esophageal cancer Neg Hx   . Rectal cancer Neg Hx   . Stomach cancer Neg Hx     SOCIAL HISTORY: Social History   Socioeconomic History  . Marital status: Single    Spouse name: Not on file  . Number of children: 0  . Years of education: Not on file  . Highest education level: Associate degree: academic program  Occupational History    Comment: Patent attorney  Tobacco Use  . Smoking status: Never Smoker  . Smokeless tobacco: Never Used  Vaping Use  . Vaping Use: Never used  Substance and Sexual Activity  . Alcohol use: Never  . Drug use: Never  . Sexual activity: Not on file  Other Topics Concern  . Not on file  Social  History Narrative  . Not on file   Social Determinants of Health   Financial Resource Strain: Not on file  Food Insecurity: Not on file  Transportation Needs: Not on file  Physical Activity: Not on file  Stress: Not on file  Social Connections: Not on file  Intimate Partner Violence: Not on file     PHYSICAL EXAM  GENERAL EXAM/CONSTITUTIONAL: Vitals:  Vitals:   07/19/20 0925  BP: (!) 141/80  Pulse: 96  Weight: (!) 312 lb (141.5 kg)  Height: 5\' 9"  (1.753 m)   Body mass index is 46.07 kg/m. Wt Readings from Last 3 Encounters:  07/19/20 (!) 312 lb (141.5 kg)  07/05/20 (!) 310 lb (140.6 kg)  06/28/20 (!) 314 lb 9.6 oz (142.7 kg)    Patient is in no distress; well developed, nourished and groomed; neck is supple  CARDIOVASCULAR:  Examination of carotid arteries is normal; no carotid bruits  Regular rate and rhythm, no murmurs  Examination of peripheral vascular system by observation and palpation is normal  EYES:  Ophthalmoscopic exam of optic discs and posterior segments is normal; no papilledema or hemorrhages No exam data present  MUSCULOSKELETAL:  Gait, strength, tone, movements noted in Neurologic exam below  NEUROLOGIC: MENTAL STATUS:  No flowsheet data found.  awake, alert, oriented to person, place and time  recent and remote memory intact  normal attention and concentration  language fluent, comprehension intact, naming intact  fund of knowledge appropriate  CRANIAL NERVE:   2nd - no papilledema on fundoscopic exam  2nd, 3rd, 4th, 6th - pupils equal and reactive to light, visual fields full to confrontation, extraocular muscles intact, no nystagmus  5th - facial sensation symmetric  7th - facial strength symmetric  8th - hearing intact  9th - palate elevates symmetrically, uvula midline  11th - shoulder shrug symmetric  12th - tongue protrusion midline  MOTOR:   normal bulk and tone, full strength in the BUE, BLE  SENSORY:    normal and symmetric to light touch, temperature, vibration  COORDINATION:   finger-nose-finger, fine finger movements normal  REFLEXES:   deep tendon reflexes TRACE and symmetric  GAIT/STATION:   narrow based gait     DIAGNOSTIC DATA (LABS, IMAGING, TESTING) - I reviewed patient records, labs, notes, testing and imaging myself where available.  Lab Results  Component Value Date   WBC 7.7 07/15/2019   HGB 14.1 07/15/2019   HCT 43.4 07/15/2019   MCV 93.7 07/15/2019   PLT 233 07/15/2019      Component Value Date/Time   NA 139 06/28/2020 1417   NA 142 03/25/2020 1000   K 4.3 06/28/2020 1417  CL 102 06/28/2020 1417   CO2 25 06/28/2020 1417   GLUCOSE 122 (H) 06/28/2020 1417   BUN 15 06/28/2020 1417   BUN 15 03/25/2020 1000   CREATININE 1.15 06/28/2020 1417   CALCIUM 9.5 06/28/2020 1417   PROT 7.8 06/28/2020 1417   PROT 8.1 12/19/2018 1058   ALBUMIN 4.5 06/28/2020 1417   ALBUMIN 5.2 (H) 12/19/2018 1058   AST 27 06/28/2020 1417   ALT 36 06/28/2020 1417   ALKPHOS 47 06/28/2020 1417   BILITOT 0.6 06/28/2020 1417   BILITOT 0.6 12/19/2018 1058   GFRNONAA >60 06/28/2020 1417   GFRAA 106 03/25/2020 1000   Lab Results  Component Value Date   CHOL 185 06/28/2020   HDL 40 (L) 06/28/2020   LDLCALC UNABLE TO CALCULATE IF TRIGLYCERIDE OVER 400 mg/dL 06/28/2020   LDLDIRECT 70.6 06/28/2020   TRIG 997 (H) 06/28/2020   CHOLHDL 4.6 06/28/2020   Lab Results  Component Value Date   HGBA1C 6.1 03/11/2020   No results found for: [id number] Lab Results  Component Value Date   TSH 2.400 11/06/2018    11/08/2018 MRI MRA head -Normal    ASSESSMENT AND PLAN  50 y.o. year old male here with:  Dx:  1. Migraine with aura and without status migrainosus, not intractable       PLAN:  MIGRAINE WITH AURA  MIGRAINE TREATMENT PLAN:  MIGRAINE PREVENTION  LIFESTYLE CHANGES -Stop or avoid smoking -Decrease or avoid caffeine / alcohol -Eat and sleep on a regular  schedule -Exercise several times per week - start topiramate 50mg  at bedtime; after 1-2 weeks increase to 50mg  twice a day; drink plenty of water  MIGRAINE RESCUE  - ibuprofen, tylenol as needed - start rizatriptan (Maxalt) 10mg  as needed for breakthrough headache; may repeat x 1 after 2 hours; max 2 tabs per day or 8 per month - consider rimegepant (Nurtec) 75mg  as needed for breakthrough headache; max 8 per month  MILD INTERMITTENT ARM NUMBNESS (cervical radiculopathies vs peripheral neuropathies) - consider MRI cervical spine, EMG/NCS if symptoms worsen and are more consistent  INTERMITTENT RIGHT THIGH NUMBNESS (meralgia paresthetica) - consider weight loss strategies, stretching exercises, PT  Meds ordered this encounter  Medications  . topiramate (TOPAMAX) 50 MG tablet    Sig: Take 1 tablet (50 mg total) by mouth 2 (two) times daily.    Dispense:  60 tablet    Refill:  12  . rizatriptan (MAXALT-MLT) 10 MG disintegrating tablet    Sig: Take 1 tablet (10 mg total) by mouth as needed for migraine. May repeat in 2 hours if needed    Dispense:  9 tablet    Refill:  11   Return in about 6 months (around 01/16/2021) for with NP (Amy Lomax).    Penni Bombard, MD 05/29/7508, 25:85 AM Certified in Neurology, Neurophysiology and Neuroimaging  [facility] [address], [place], [place] [address] [phone]

## 2020-07-19 NOTE — Patient Instructions (Signed)
  MIGRAINE TREATMENT PLAN:  MIGRAINE PREVENTION  LIFESTYLE CHANGES -Stop or avoid smoking -Decrease or avoid caffeine / alcohol -Eat and sleep on a regular schedule -Exercise several times per week - start topiramate 50mg  at bedtime; after 1-2 weeks increase to 50mg  twice a day; drink plenty of water  MIGRAINE RESCUE  - ibuprofen, tylenol as needed - start rizatriptan (Maxalt) 10mg  as needed for breakthrough headache; may repeat x 1 after 2 hours; max 2 tabs per day or 8 per month - consider rimegepant (Nurtec) 75mg  as needed for breakthrough headache; max 8 per month   ARM NUMBNESS (pinched nerves --> cervical radiculopathies vs peripheral neuropathies) - consider MRI cervical spine, EMG/NCS if symptoms worsen and are more consistent   RIGHT THIGH NUMBNESS (pinched nerve --> meralgia paresthetica) - consider weight loss strategies, stretching exercises, PT

## 2020-07-28 ENCOUNTER — Other Ambulatory Visit: Payer: Self-pay

## 2020-07-28 ENCOUNTER — Telehealth: Payer: Self-pay

## 2020-07-28 ENCOUNTER — Telehealth: Payer: Self-pay | Admitting: Pharmacist

## 2020-07-28 DIAGNOSIS — E781 Pure hyperglyceridemia: Secondary | ICD-10-CM

## 2020-07-28 DIAGNOSIS — Z79899 Other long term (current) drug therapy: Secondary | ICD-10-CM

## 2020-07-28 MED ORDER — FENOFIBRATE 145 MG PO TABS: 145.0000 mg | ORAL_TABLET | Freq: Every day | ORAL | 3 refills | Status: DC

## 2020-07-28 MED ORDER — ROSUVASTATIN CALCIUM 40 MG PO TABS: 40.0000 mg | ORAL_TABLET | Freq: Every day | ORAL | 3 refills | Status: DC

## 2020-07-28 MED ORDER — ICOSAPENT ETHYL 1 G PO CAPS: 1.0000 g | ORAL_CAPSULE | Freq: Two times a day (BID) | ORAL | 5 refills | Status: DC

## 2020-07-28 MED ORDER — ICOSAPENT ETHYL 1 G PO CAPS: 2.0000 g | ORAL_CAPSULE | Freq: Two times a day (BID) | ORAL | 3 refills | Status: DC

## 2020-07-28 NOTE — Telephone Encounter (Signed)
Patient was contacted and verbalized understanding. Vascepa 1g BID and referral to Lipid clinic ordered.

## 2020-07-28 NOTE — Telephone Encounter (Signed)
-----   Message from Josue Hector, MD sent at 07/28/2020  4:25 PM EST ----- Start vascepa 1 gr bid and f/u in lipid clinic  ----- Message ----- From: Reola Mosher Sent: 07/28/2020   2:21 PM EST To: Josue Hector, MD  His triglycerides are extremely high. Need suggestions on meds. Refer to lipid clinic?

## 2020-07-28 NOTE — Progress Notes (Signed)
Vascepa 1g BID Referral to Lipid clinic.

## 2020-07-28 NOTE — Telephone Encounter (Signed)
Leeroy Bock, RPH-CPP  07/28/2020 4:54 PM EST Back to Top     Called pt to discuss lab results. He currently has both atorvastatin 40mg  and rosuvastatin 20mg  daily on his med list. Pt takes he has been taking both medications. He confirmed labs were drawn fasting. He does not drink alcohol and his intake of carbs/sugar are moderate. Discussed risk of pancreatitis with TG this elevated.  Advised patient to: stop atorvastatin, increase rosuvastatin to 40mg  daily, start fenofibrate 145mg  daily, and start Vascepa 2g BID. Will need labs rechecked in about 3 months (follows in [place]).   Berlinda Last, [facility]  07/28/2020 4:46 PM EST      Patient was contacted and verbalized understanding. Vascepa 1g BID and referral to Lipid clinic ordered.    Evelene Croon Barrett, PA-C  07/28/2020 2:21 PM EST      His triglycerides are extremely high. Need suggestions on meds. Refer to lipid clinic?

## 2020-07-29 ENCOUNTER — Telehealth: Payer: Self-pay | Admitting: Pharmacist

## 2020-07-29 NOTE — Telephone Encounter (Signed)
Called in [place] card yesterday to pharmacy. Pharmacy stated his copay was $219 for a 3 month supply and that after adding in his copay card, the cost did not drop at all. Provided pharmacy with processing # to call since the copay card should bring his cost down to $9.  Called back today for an update. Was advised they still have not been able to bring the cost down and that they did not reach out to the processor. They asked for this phone # again which I have provided. Will call again tomorrow for an update.

## 2020-07-30 MED ORDER — OMEGA-3-ACID ETHYL ESTERS 1 G PO CAPS: 2.0000 g | ORAL_CAPSULE | Freq: Two times a day (BID) | ORAL | 3 refills | Status: DC

## 2020-07-30 NOTE — Telephone Encounter (Signed)
Called [facility] again. Spoke with a different tech who was more helpful. Copay for Vascepa through just his insurance is $377 and after copay card is $219 - copay card has a max amount they'll pay each month.  Sent over rx for Lovaza instead to see if this is more affordable for pt.

## 2020-07-30 NOTE — Telephone Encounter (Signed)
Generic Lovaza still $90 for a 1 month supply. Called pt to see if he has a [address] [address] near him since [employer] there is around $28. He does not, closest one is in [place]. Asks if he can take OTC fish oil instead which is cheaper for him. Due to cost restriction of Lovaza and Vascepa, ok with pt taking OTC fish oil since his TG are ~1,000. Advised him to look for 1,000mg  capsules and to take 2 twice daily. He verbalized understanding.

## 2020-08-06 NOTE — Progress Notes (Signed)
Cardiology Office Note   Date:  08/16/2020   ID:  Rodney Lane, DOB [birth year], MRN [id number]  PCP:  Rodney Clan, DO  Cardiologist:  Rodney Lane    No chief complaint on file.     History of Present Illness: Rodney Lane is a 50 y.o. male who presents for follow up lipids   Chest pain, HTN, obesity OSA on CPAP  Admitted to [facility] 11/07/2018 with acute onset of worsened SOB, CP, palpitations. In the ER noted to be in ST 150's treated initially with adenosine that had no effect, then IVF, ASA and NTG, rates improved and symptoms improved as well. CT chest negative for PE, his COVD test here negative, no obvious signs of infection, afebrile, WBC wnl. He also had a TSH that was wnl. CRP and sed rate elevated.  Hgb A1c 6.8.    Echo with LVEF 50-55%, no WMA described, +impaired relaxation, no pericardial effusion. Pt declined stress test secondary to bad past experience, body habitus and tachycardia precludes coronary CT  He was seen by [address]ney [address] and placed on dilt 240 mg daily.  Hydrate.   Suspected ST alone.  BB stopped due to weakness and fatigue.    11/25/18 still felt bad.He had lexiscan myoview neg for ischemia, then had appt with [address]  And dilt stopped and verapamil ER 360 started and losartan 50 mg daily.  Pt still with SOB so PFTs and pulmonary consult were made.   PFTls reviewed 01/02/19 were normal with minimal small airway dx Getting his CPAP arranged through [address] [address]   He has multiple somatic complaints that all seem to be related to being out of shape and overweight with chronic back pain and right sided sciatica   06/28/20 Clonidine started for BP in addition to losartan and verapamil. Diuretic not given due to history of Cr 1.5   Working at [employer] in [place]. Discussed utility of bariatric surgical evaluation    Past Medical History:  Diagnosis Date  . Allergy    thru the year   . Anxiety   . Blurry vision, bilateral 08/26/2019  . Coronary artery  calcification   . Diabetes mellitus (HCC)    borderline- no meds   . Dilatation of aorta (HCC)   . GERD (gastroesophageal reflux disease)    past hx- not current   . Hepatic steatosis   . HTN (hypertension)   . Hypertriglyceridemia   . Inappropriate sinus tachycardia   . Morbid obesity ([place])   . Sleep apnea    wears cpap nightly     Past Surgical History:  Procedure Laterality Date  . COLONOSCOPY     2 past colons- 1 civilian, 1 military - normal x 2 per pt   . WISDOM TOOTH EXTRACTION Bilateral    2000     Current Outpatient Medications  Medication Sig Dispense Refill  . cloNIDine (CATAPRES) 0.1 MG tablet Take 1 tablet (0.1 mg total) by mouth 2 (two) times daily. 180 tablet 3  . losartan (COZAAR) 100 MG tablet Take 1 tablet (100 mg total) by mouth daily. 90 tablet 3  . Omega-3 Fatty Acids (FISH OIL) 1000 MG CAPS Take 2 capsules by mouth 2 (two) times daily.    . rizatriptan (MAXALT-MLT) 10 MG disintegrating tablet Take 1 tablet (10 mg total) by mouth as needed for migraine. May repeat in 2 hours if needed 9 tablet 11  . rosuvastatin (CRESTOR) 40 MG tablet Take 1 tablet (40 mg total) by mouth daily.  90 tablet 3  . sertraline (ZOLOFT) 25 MG tablet Take 25 mg by mouth daily.    . sildenafil (REVATIO) 20 MG tablet Take 3-5 tablets as needed. 60 tablet 2  . topiramate (TOPAMAX) 50 MG tablet Take 1 tablet (50 mg total) by mouth 2 (two) times daily. 60 tablet 12  . verapamil (CALAN-SR) 240 MG CR tablet Take 1 tablet (240 mg total) by mouth at bedtime. 90 tablet 2   No current facility-administered medications for this visit.    Allergies:   Patient has no known allergies.    Social History:  The patient  reports that he has never smoked. He has never used smokeless tobacco. He reports that he does not drink alcohol and does not use drugs.   Family History:  The patient's family history includes Cancer in his father and mother; Colon cancer in his father; Diabetes in his father;  Heart disease in his maternal grandfather and paternal grandfather.    ROS:  General:no colds or fevers, + weight increase  Skin:no rashes or ulcers HEENT:no blurred vision, no congestion CV:see HPI PUL:see HPI GI:no diarrhea constipation or melena, no indigestion GU:no hematuria, no dysuria, erectile dysfunction new for pt  MS:no joint pain, no claudication Neuro:no syncope, no lightheadedness Endo:+ diabetes has not been treated, no PCP HgbA1C is 6.8 , no thyroid disease  Wt Readings from Last 3 Encounters:  08/16/20 (!) 141.5 kg  07/19/20 (!) 141.5 kg  07/05/20 (!) 140.6 kg     PHYSICAL EXAM: VS:  BP 126/82   Pulse 91   Ht 5\' 9"  (1.753 m)   Wt (!) 141.5 kg   SpO2 98%   BMI 46.07 kg/m  , BMI Body mass index is 46.07 kg/m.  Affect appropriate Overweight black male  HEENT: normal Neck supple with no adenopathy JVP normal no bruits no thyromegaly Lungs clear with no wheezing and good diaphragmatic motion Heart:  S1/S2 no murmur, no rub, gallop or click PMI normal Abdomen: benighn, BS positve, no tenderness, no AAA no bruit.  No HSM or HJR Distal pulses intact with no bruits No edema Neuro non-focal Skin warm and dry No muscular weakness    EKG:  EKG is ordered today. The ekg ordered today demonstrates ST at 107 no acute ST changes    Recent Labs: 06/28/2020: ALT 36; BUN 15; Creatinine, Ser 1.15; Potassium 4.3; Sodium 139    Lipid Panel    Component Value Date/Time   CHOL 185 06/28/2020 1417   CHOL 212 (H) 03/11/2020 0922   TRIG 997 (H) 06/28/2020 1417   HDL 40 (L) 06/28/2020 1417   HDL 37 (L) 03/11/2020 0922   CHOLHDL 4.6 06/28/2020 1417   VLDL UNABLE TO CALCULATE IF TRIGLYCERIDE OVER 400 mg/dL 06/28/2020 1417   LDLCALC UNABLE TO CALCULATE IF TRIGLYCERIDE OVER 400 mg/dL 06/28/2020 1417   LDLCALC 99 03/11/2020 0922   LDLDIRECT 70.6 06/28/2020 1417       Other studies Reviewed: Additional studies/ records that were reviewed today include: . Echo  11/07/18 IMPRESSIONS    1. The left ventricle has low normal systolic function, with an ejection fraction of 50-55%. The cavity size was normal. There is mildly increased left ventricular wall thickness. Left ventricular diastolic Doppler parameters are consistent with  impaired relaxation.  2. The right ventricle has normal systolic function. The cavity was normal. There is no increase in right ventricular wall thickness.  3. The aortic valve was not well visualized.  FINDINGS  Left Ventricle: The  left ventricle has low normal systolic function, with an ejection fraction of 50-55%. The cavity size was normal. There is mildly increased left ventricular wall thickness. Left ventricular diastolic Doppler parameters are consistent  with impaired relaxation.  Right Ventricle: The right ventricle has normal systolic function. The cavity was normal. There is no increase in right ventricular wall thickness.  Left Atrium: Left atrial size was normal in size.  Right Atrium: Right atrial size was normal in size. Right atrial pressure is estimated at 10 mmHg.  Interatrial Septum: No atrial level shunt detected by color flow Doppler.  Pericardium: There is no evidence of pericardial effusion.  Mitral Valve: The mitral valve was not well visualized. Mitral valve regurgitation is not visualized by color flow Doppler.  Tricuspid Valve: The tricuspid valve is normal in structure. Tricuspid valve regurgitation was not visualized by color flow Doppler.  Aortic Valve: The aortic valve was not well visualized Aortic valve regurgitation was not visualized by color flow Doppler. There is no evidence of aortic valve stenosis.  Pulmonic Valve: The pulmonic valve was normal in structure. Pulmonic valve regurgitation is not visualized by color flow Doppler.  Venous: The inferior vena cava is normal in size with greater than 50% respiratory variability.  nuc study 11/27/18   Study  Highlights    Nuclear stress EF: 37%. Generalized hypokinesis noted. Dilated left ventricle.  There was no ST segment deviation noted during stress. There was no transient ischemic dilatation.  This is an intermediate risk study based upon reduction in ejection fraction. There were no perfusion defects identified suggestive of nonischemic cardiomyopathy.        ASSESSMENT AND PLAN:  1.  Chest pain with neg lexiscan myoview done 11/27/18  Observe   2.  Dyspnea:  Non cardiac EF 60-65% by echo 03/01/20 no valve disease or evidence of pulmonary HTN BNP was normal on 12/19/18  CTA negative for PE on 11/07/18 PFTls    3.  HTN on ARB, calcium blocker and clonidine improved   4.   CKD 2 need to monitor with ARB Cr is 1.15 06/28/20   5.  DM-2 Discussed low carb diet.  Target hemoglobin A1c is 6.5 or less.  Continue current medications.   6.  Tachycardia improved 85 today followed by [address]   7.  OSA:  Start CPAP per [address]  8. Lipids: vascepa and lovaza cost prohibitive on fish oil , tricor and crestor LDL 70 f/u triglycerides April 2022 they were as high as 997 off Rx due to cost 06/28/20   Lipids April 2022 F/U lipid clinic post FU with me in a year   Signed, Jenkins Rouge, MD  08/16/2020 8:13 AM    [facility] [place], [address] [place], [place] Phone: [phone]; Fax: [phone]

## 2020-08-16 ENCOUNTER — Encounter: Payer: Self-pay | Admitting: Cardiovascular Disease

## 2020-08-16 ENCOUNTER — Other Ambulatory Visit: Payer: Self-pay

## 2020-08-16 ENCOUNTER — Ambulatory Visit (INDEPENDENT_AMBULATORY_CARE_PROVIDER_SITE_OTHER): Payer: BC Managed Care – PPO | Admitting: Cardiovascular Disease

## 2020-08-16 DIAGNOSIS — E782 Mixed hyperlipidemia: Secondary | ICD-10-CM | POA: Diagnosis not present

## 2020-08-16 DIAGNOSIS — I10 Essential (primary) hypertension: Secondary | ICD-10-CM

## 2020-08-16 DIAGNOSIS — R0609 Other forms of dyspnea: Secondary | ICD-10-CM

## 2020-08-16 DIAGNOSIS — R0600 Dyspnea, unspecified: Secondary | ICD-10-CM

## 2020-08-16 NOTE — Patient Instructions (Signed)
Medication Instructions:  *If you need a refill on your cardiac medications before your next appointment, please call your pharmacy*  Lab Work: Your physician recommends that you return for lab work in: April for lipid and liver panel.  If you have labs (blood work) drawn today and your tests are completely normal, you will receive your results only by: Marland Kitchen MyChart Message (if you have MyChart) OR . A paper copy in the mail If you have any lab test that is abnormal or we need to change your treatment, we will call you to review the results.  Testing/Procedures: None ordered.    Follow-Up: At [facility], you and your health needs are our priority.  As part of our continuing mission to provide you with exceptional heart care, we have created designated Provider Care Teams.  These Care Teams include your primary Cardiologist (physician) and Advanced Practice Providers (APPs -  Physician Assistants and Nurse Practitioners) who all work together to provide you with the care you need, when you need it.  We recommend signing up for the patient portal called "MyChart".  Sign up information is provided on this After Visit Summary.  MyChart is used to connect with patients for Virtual Visits (Telemedicine).  Patients are able to view lab/test results, encounter notes, upcoming appointments, etc.  Non-urgent messages can be sent to your provider as well.   To learn more about what you can do with MyChart, go to [email or web address].    Your next appointment:   1 year(s)  The format for your next appointment:   In Person  Provider:   You may see Jenkins Rouge, MD or one of the following Advanced Practice Providers on your designated Care Team:    Kathyrn Drown, NP  Your physician recommends that you schedule a follow-up appointment in the lipid clinic.

## 2020-08-17 ENCOUNTER — Ambulatory Visit: Payer: BC Managed Care – PPO | Admitting: Gastroenterology

## 2020-08-30 ENCOUNTER — Other Ambulatory Visit: Payer: BC Managed Care – PPO

## 2020-09-06 ENCOUNTER — Other Ambulatory Visit: Payer: BC Managed Care – PPO | Admitting: *Deleted

## 2020-09-06 ENCOUNTER — Other Ambulatory Visit: Payer: Self-pay

## 2020-09-06 ENCOUNTER — Telehealth: Payer: Self-pay | Admitting: Pharmacist

## 2020-09-06 ENCOUNTER — Ambulatory Visit (INDEPENDENT_AMBULATORY_CARE_PROVIDER_SITE_OTHER): Payer: BC Managed Care – PPO

## 2020-09-06 DIAGNOSIS — E781 Pure hyperglyceridemia: Secondary | ICD-10-CM

## 2020-09-06 DIAGNOSIS — E782 Mixed hyperlipidemia: Secondary | ICD-10-CM

## 2020-09-06 LAB — HEPATIC FUNCTION PANEL
ALT: 37 IU/L (ref 0–44)
AST: 23 IU/L (ref 0–40)
Albumin: 4.4 g/dL (ref 4.0–5.0)
Alkaline Phosphatase: 58 IU/L (ref 44–121)
Bilirubin Total: <0.2 mg/dL (ref 0.0–1.2)
Bilirubin, Direct: <0.1 mg/dL (ref 0.00–0.40)
Total Protein: 7.2 g/dL (ref 6.0–8.5)

## 2020-09-06 LAB — LIPID PANEL
Chol/HDL Ratio: 6.6 ratio — ABNORMAL HIGH (ref 0.0–5.0)
Cholesterol, Total: 204 mg/dL — ABNORMAL HIGH (ref 100–199)
HDL: 31 mg/dL — ABNORMAL LOW (ref >39)
LDL Chol Calc (NIH): 87 mg/dL (ref 0–99)
Triglycerides: 528 mg/dL — ABNORMAL HIGH (ref 0–149)
VLDL Cholesterol Cal: 86 mg/dL — ABNORMAL HIGH (ref 5–40)

## 2020-09-06 MED ORDER — FENOFIBRATE 145 MG PO TABS: 145.0000 mg | ORAL_TABLET | Freq: Every day | ORAL | 3 refills | Status: DC

## 2020-09-06 MED ORDER — VASCEPA 1 G PO CAPS: 2.0000 g | ORAL_CAPSULE | Freq: Two times a day (BID) | ORAL | 3 refills | Status: DC

## 2020-09-06 NOTE — Telephone Encounter (Signed)
Lipids back today - TG 528 which is actually 50% better than labs from 2 months ago. Pt not on any lipid lowering therapy because he didn't provide his pharmacy with his updated insurance info so all of his medication copays have been cost prohibitive.  He is aware of results and to start rosuvastatin 40mg  daily, fenofibrate 145mg  daily, and Vascepa 2g BID. Lifestyle modifications also discussed at PharmD visit today. Will recheck lipids in 2-3 months to assess efficacy.

## 2020-09-06 NOTE — Progress Notes (Signed)
Patient ID: Rodney Lane                 DOB: 11-18-1970                    MRN: [id number]     HPI: Marshall Kampf is a 50 y.o. male patient referred to lipid clinic by Dr. Johnsie Cancel. PMH is significant for hypertriglyceridemia, HTN, CAD (calcified coronary artery atherosclerosis on chest CTA 11/07/18), chest pain, obesity, diabetes (A1c 6.0, not on meds), and OSA on CPAP.   Today, patient presents to lipid clinic alone and in good spirits. He denies any muscle pain or weakness. Reports tolerating his current medications well. Patient expressed concerns for significant medication cost issues which have impacted his ability to obtain his medications. On further inquiry, it appears he has not been taking many of his medications, including atorvastatin and fenofibrate. After speaking with his outpatient pharmacy, it was discovered that they did not have his current insurance card on file, thus his copays were quite high for all of his medications. His most recent labs from 06/28/20 show triglycerides have significantly increased to 997. He has not yet begun exercising but stated he has used [employer] in the past which worked for him. We discussed ways to incorporate exercise into his week, either in or out of a gym setting. He is still working to improve his diet and we dicussed a heart healthy diet for patients with elevated triglycerides.   Current Medications: rosuvastatin 40 mg daily, Fish Oil 2000 mg BID, fenofibrate 145 mg daily  Intolerances: None Risk Factors: HTN, CAD, obesity  LDL goal: <70, TG <150   Diet:  Breakfast - cereal + whole milk, sometimes sausage Mcmuffin, juice (orange or pineapple), coffee  Lunch - [place] deli meat, grilled chicken, green beans (can or frozen), some potatoes Dinner - beef, chicken, fish, potatoes, green beans, collards,  Snacks - tries not to snack but does occasionally indulge in desserts, fruit cups  - Mostly grills, uses an air fryer, doesn't fry in oil  -  Occasionally drinks soda but mostly a lot of good  Exercise: On feet all day for work, used to [employer] but difficult with work schedule.   Labs:  06/28/20: TC 185, TG 997, HDL 40, dLDL 70.6  Past Medical History:  Diagnosis Date  . Allergy    thru the year   . Anxiety   . Blurry vision, bilateral 08/26/2019  . Coronary artery calcification   . Diabetes mellitus (HCC)    borderline- no meds   . Dilatation of aorta (HCC)   . GERD (gastroesophageal reflux disease)    past hx- not current   . Hepatic steatosis   . HTN (hypertension)   . Hypertriglyceridemia   . Inappropriate sinus tachycardia   . Morbid obesity ([place])   . Sleep apnea    wears cpap nightly     Current Outpatient Medications on File Prior to Visit  Medication Sig Dispense Refill  . cloNIDine (CATAPRES) 0.1 MG tablet Take 1 tablet (0.1 mg total) by mouth 2 (two) times daily. 180 tablet 3  . losartan (COZAAR) 100 MG tablet Take 1 tablet (100 mg total) by mouth daily. 90 tablet 3  . Omega-3 Fatty Acids (FISH OIL) 1000 MG CAPS Take 2 capsules by mouth 2 (two) times daily.    . rizatriptan (MAXALT-MLT) 10 MG disintegrating tablet Take 1 tablet (10 mg total) by mouth as needed for migraine. May repeat in  2 hours if needed 9 tablet 11  . rosuvastatin (CRESTOR) 40 MG tablet Take 1 tablet (40 mg total) by mouth daily. 90 tablet 3  . sertraline (ZOLOFT) 25 MG tablet Take 25 mg by mouth daily.    . sildenafil (REVATIO) 20 MG tablet Take 3-5 tablets as needed. 60 tablet 2  . topiramate (TOPAMAX) 50 MG tablet Take 1 tablet (50 mg total) by mouth 2 (two) times daily. 60 tablet 12  . verapamil (CALAN-SR) 240 MG CR tablet Take 1 tablet (240 mg total) by mouth at bedtime. 90 tablet 2   No current facility-administered medications on file prior to visit.    No Known Allergies  Assessment/Plan:  1. Hypertriglyceridemia - Patient's triglycerides remain above goal (<150) at 997. Given his medication nonadherence, his  repeat labs today are anticipated to remain elevated. Unfortunately his pharmacy did not have his active insurance on file so all of his meds have been cost prohibitive. This has been sorted out. Pt to start rosuvastatin 40mg  daily, fenofibrate 145mg  daily, and Vascepa 2g BID (previously cost prohibitive, also due to pharmacy not having his insurance on file). Educated patient on ways to improve diet and exercise, and provided educational material. Will recheck lipid panel today and contact the patient to schedule further follow-up.   Claudina Lick, PharmD PGY1 Acute Care Pharmacy Resident

## 2020-09-06 NOTE — Patient Instructions (Addendum)
Thank you for visiting [place] today!   Great job with managing your blood pressure!   Continue to work on improving diet and exercise as able.  Medications: - Rosuvastatin 40 mg (1 tab) daily - Fenofibrate 145 mg (1 tab) daily  - Fish Oil 2000 mg (2 capsules) twice a day  We will call you with the results of your labs and schedule further follow-up.  Please give [place] a call if you have any questions or concerns!

## 2020-09-14 ENCOUNTER — Encounter: Payer: Self-pay | Admitting: Family Medicine

## 2020-09-16 ENCOUNTER — Other Ambulatory Visit: Payer: Self-pay | Admitting: Family Medicine

## 2020-09-16 DIAGNOSIS — J302 Other seasonal allergic rhinitis: Secondary | ICD-10-CM

## 2020-09-16 MED ORDER — FLUTICASONE PROPIONATE 50 MCG/ACT NA SUSP: 2.0000 | Freq: Every day | NASAL | 6 refills | Status: DC

## 2020-10-01 ENCOUNTER — Encounter: Payer: Self-pay | Admitting: Family Medicine

## 2020-10-29 ENCOUNTER — Encounter: Payer: Self-pay | Admitting: Family Medicine

## 2020-10-29 ENCOUNTER — Ambulatory Visit (INDEPENDENT_AMBULATORY_CARE_PROVIDER_SITE_OTHER): Payer: BC Managed Care – PPO | Admitting: Family Medicine

## 2020-10-29 ENCOUNTER — Other Ambulatory Visit: Payer: Self-pay

## 2020-10-29 DIAGNOSIS — E781 Pure hyperglyceridemia: Secondary | ICD-10-CM | POA: Diagnosis not present

## 2020-10-29 DIAGNOSIS — R200 Anesthesia of skin: Secondary | ICD-10-CM

## 2020-10-29 DIAGNOSIS — M545 Low back pain, unspecified: Secondary | ICD-10-CM

## 2020-10-29 DIAGNOSIS — E119 Type 2 diabetes mellitus without complications: Secondary | ICD-10-CM

## 2020-10-29 DIAGNOSIS — I10 Essential (primary) hypertension: Secondary | ICD-10-CM

## 2020-10-29 DIAGNOSIS — G8929 Other chronic pain: Secondary | ICD-10-CM

## 2020-10-29 NOTE — Patient Instructions (Signed)
It was wonderful to see you.  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  With the rest of your history, it is less likely that this pain is coming from your back.  It is important that you continue to work towards her goals of following a more well-balanced diet and being active when he cannot an effort to achieve weight loss.  Please avoid sitting for prolonged periods of time or bending over as this will irritate the area.  Try to have your pants at a higher region and avoid wearing belts as do not impact..  You can keep using the topical pain creams and Tylenol as needed up to 4000 mg daily.  Ice/heat should be fine as well if you get any benefit.  I will fill out FMLA, however this may take quite some time to improve.

## 2020-10-29 NOTE — Progress Notes (Signed)
SUBJECTIVE:   CHIEF COMPLAINT / HPI: "Pinched nerve"  Rodney Lane is a 50 year old gentleman presenting to discuss the following:  Anterior leg pain: Several month history, thinks since about 05/2020.  "Weird sensation" in front right thigh, sometimes on the side of his leg. Can feel like burning/numb-like.  Comes and goes randomly it seems, can happen with walking or sitting. Never comes on when he is laying down.  Will last a few minutes at a time and resolves on its own.  Chronic back pain since 1993, back pain has been unchanged with this, has not increased in severity or frequency. Previous history of degenerative disc around L4-L5. Used to get spinal injections (said 4 injections on each side of his back and then epidural), unsure who he used to see.  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 No preceding injury or trauma to the area. He has been using Voltaren gel which helps but not for long in addition to Tylenol. Denies any associated muscular weakness, sensory only.  His work requested that he have FMLA for intermittent necessity if his leg is bothering him more than usual during that day.   PERTINENT  PMH / PSH: Hypertension, prediabetes, OSA, tachycardia, hypertriglyceridemia  OBJECTIVE:   BP (!) 135/95   Pulse 74   Ht 5\' 9"  (1.753 m)   Wt (!) 313 lb (142 kg)   SpO2 95%   BMI 46.22 kg/m   General: Alert, NAD HEENT: NCAT, MMM Lungs: No increased WOB  Ext: Warm, dry Lumbar spine: - Inspection: no gross deformity or asymmetry, swelling or ecchymosis - Palpation: No TTP over the spinous processes, however with some tenderness over right quadratus musculature.  No TTP over SI joints or right piriformis.  Nontender to palpation of anterior thigh. - ROM: full active ROM of the lumbar spine in flexion and extension without pain - Strength: 5/5 strength of lower extremity in L4-S1 nerve root distributions b/l;  normal gait - Neuro: sensation intact in the L4-S1 nerve root distribution b/l, 2+ L4 reflexes - Special testing: Negative straight leg raise Physical Exam Musculoskeletal:       Legs:     Comments: Region of "weird sensation"     ASSESSMENT/PLAN:   Numbness of right anterior thigh Intermittent abnormal sensation mainly with prolonged sitting or with walking, overall sounds suspicious for meralgia paresthetica.  Known chronic low back pain predominantly around L4-L5 that has been unchanged, would not fit with this dermatomal pattern and less suspicion for higher lumbar radiculopathy.  Provided reassurance and educated on pathophysiology.  Encouraged towards weight loss as ultimate long-term treatment, can use ice/heat/Voltaren gel/Tylenol.  Offload compression on anterior thigh as much as possible.  Offered weight loss support (ie. Dietician etc), however he would like to try modifying his diet and activity by himself first.  Will provide FMLA.  HTN (hypertension) Much improved with reasonable control today.  Continue verapamil, losartan, and clonidine.  Patient reports he does not currently need any refills.  Hypertriglyceridemia Continue fenofibrate.  Patient self discontinued Vascepa and using OTC fish oil supplementation, following up with cardiology.   Diabetes mellitus without complication ([place]) Diet controlled with last A1c 6.1 in 02/2020.  Unfortunately ended up not having A1c collected today, will have this done on follow-up visit.  Chronic low back pain Unchanged.  Offered referral to orthopedic/neurosurgery for consideration of repeating spinal injections, however he declined at this time.  States he has  been able to manage his pain for the past several years without any further injections and will reconsider if worsening.    Follow-up in a few weeks/1 month or sooner if needed.  ED precautions discussed.  Patriciaann Clan, [place]

## 2020-11-01 ENCOUNTER — Encounter: Payer: Self-pay | Admitting: Family Medicine

## 2020-11-01 DIAGNOSIS — R200 Anesthesia of skin: Secondary | ICD-10-CM | POA: Insufficient documentation

## 2020-11-01 DIAGNOSIS — M545 Low back pain, unspecified: Secondary | ICD-10-CM | POA: Insufficient documentation

## 2020-11-01 NOTE — Assessment & Plan Note (Signed)
Much improved with reasonable control today.  Continue verapamil, losartan, and clonidine.  Patient reports he does not currently need any refills.

## 2020-11-01 NOTE — Assessment & Plan Note (Signed)
Unchanged.  Offered referral to orthopedic/neurosurgery for consideration of repeating spinal injections, however he declined at this time.  States he has been able to manage his pain for the past several years without any further injections and will reconsider if worsening.

## 2020-11-01 NOTE — Assessment & Plan Note (Addendum)
Intermittent abnormal sensation mainly with prolonged sitting or with walking, overall sounds suspicious for meralgia paresthetica.  Known chronic low back pain predominantly around L4-L5 that has been unchanged, would not fit with this dermatomal pattern and less suspicion for higher lumbar radiculopathy.  Provided reassurance and educated on pathophysiology.  Encouraged towards weight loss as ultimate long-term treatment, can use ice/heat/Voltaren gel/Tylenol.  Offload compression on anterior thigh as much as possible.  Offered weight loss support (ie. Dietician etc), however Rodney Lane would like to try modifying his diet and activity by himself first.  Will provide FMLA.

## 2020-11-01 NOTE — Assessment & Plan Note (Signed)
Diet controlled with last A1c 6.1 in 02/2020.  Unfortunately ended up not having A1c collected today, will have this done on follow-up visit.

## 2020-11-01 NOTE — Assessment & Plan Note (Signed)
Continue fenofibrate.  Patient self discontinued Vascepa and using OTC fish oil supplementation, following up with cardiology.

## 2020-11-22 ENCOUNTER — Other Ambulatory Visit: Payer: BC Managed Care – PPO

## 2020-11-23 ENCOUNTER — Telehealth: Payer: Self-pay | Admitting: Pharmacist

## 2020-11-23 NOTE — Telephone Encounter (Signed)
Pt missed lab appt to recheck lipids since he started rosuvastatin 40mg  daily, fenofibrate 145mg  daily, and Vascepa 2g BID.  Left message for pt to call back and reschedule lab work.

## 2020-11-24 NOTE — Telephone Encounter (Signed)
2nd message left for pt.

## 2020-11-25 NOTE — Telephone Encounter (Signed)
3rd message left for pt. 

## 2020-12-01 NOTE — Telephone Encounter (Signed)
Left message for patient to call back.   Please schedule patient for lab work when he returns call.

## 2020-12-01 NOTE — Telephone Encounter (Signed)
Pt returning a call.

## 2020-12-01 NOTE — Telephone Encounter (Signed)
4th and final message left for pt. Will wait for him to call clinic to reschedule labs at this time.

## 2020-12-13 ENCOUNTER — Other Ambulatory Visit: Payer: BC Managed Care – PPO | Admitting: *Deleted

## 2020-12-13 ENCOUNTER — Other Ambulatory Visit: Payer: Self-pay

## 2020-12-13 DIAGNOSIS — E781 Pure hyperglyceridemia: Secondary | ICD-10-CM

## 2020-12-13 LAB — HEPATIC FUNCTION PANEL
ALT: 32 IU/L (ref 0–44)
AST: 25 IU/L (ref 0–40)
Albumin: 5.1 g/dL — ABNORMAL HIGH (ref 3.8–4.9)
Alkaline Phosphatase: 41 IU/L — ABNORMAL LOW (ref 44–121)
Bilirubin Total: 0.4 mg/dL (ref 0.0–1.2)
Bilirubin, Direct: 0.12 mg/dL (ref 0.00–0.40)
Total Protein: 7.9 g/dL (ref 6.0–8.5)

## 2020-12-13 LAB — LIPID PANEL
Chol/HDL Ratio: 4.3 ratio (ref 0.0–5.0)
Cholesterol, Total: 165 mg/dL (ref 100–199)
HDL: 38 mg/dL — ABNORMAL LOW (ref >39)
LDL Chol Calc (NIH): 74 mg/dL (ref 0–99)
Triglycerides: 333 mg/dL — ABNORMAL HIGH (ref 0–149)
VLDL Cholesterol Cal: 53 mg/dL — ABNORMAL HIGH (ref 5–40)

## 2020-12-13 LAB — LDL CHOLESTEROL, DIRECT: LDL Direct: 90 mg/dL (ref 0–99)

## 2021-01-19 NOTE — Progress Notes (Deleted)
No chief complaint on file.    HISTORY OF PRESENT ILLNESS: 01/19/21 ALL:  Rodney Lane is a 50 y.o. male here today for follow up for migraines. He was started on topiramate '50mg'$  BID and rizatriptan as needed.    HISTORY (copied from Dr Gladstone Lighter previous note)  50 year old male with headaches, arm pain, leg pain.   Patient reports occipital and right-sided headaches with dizziness, nausea, photophobia and blurred vision for past 5 years, 3-4 times a month.  Patient has family history of migraine in mother.  He has never been officially diagnosed with migraine.  He takes over-the-counter medications with mild relief.   Patient also having intermittent shooting pains and tingling sensation in right greater than left arms.  Also has right hip pain, numbness in the right thigh, with chronic low back pain since 1993.   Patient also has borderline diabetes, hypertension, hypertriglyceridemia, sleep apnea, obesity, anxiety and depression.   REVIEW OF SYSTEMS: Out of a complete 14 system review of symptoms, the patient complains only of the following symptoms, and all other reviewed systems are negative.   ALLERGIES: No Known Allergies   HOME MEDICATIONS: Outpatient Medications Prior to Visit  Medication Sig Dispense Refill   cloNIDine (CATAPRES) 0.1 MG tablet Take 1 tablet (0.1 mg total) by mouth 2 (two) times daily. 180 tablet 3   fenofibrate (TRICOR) 145 MG tablet Take 1 tablet (145 mg total) by mouth daily. 90 tablet 3   fluticasone (FLONASE) 50 MCG/ACT nasal spray Place 2 sprays into both nostrils daily. 16 g 6   losartan (COZAAR) 100 MG tablet Take 1 tablet (100 mg total) by mouth daily. 90 tablet 3   rizatriptan (MAXALT-MLT) 10 MG disintegrating tablet Take 1 tablet (10 mg total) by mouth as needed for migraine. May repeat in 2 hours if needed (Patient not taking: Reported on 09/06/2020) 9 tablet 11   rosuvastatin (CRESTOR) 40 MG tablet Take 1 tablet (40 mg total) by mouth  daily. 90 tablet 3   sertraline (ZOLOFT) 25 MG tablet Take 25 mg by mouth daily.     sildenafil (REVATIO) 20 MG tablet Take 3-5 tablets as needed. 60 tablet 2   topiramate (TOPAMAX) 50 MG tablet Take 1 tablet (50 mg total) by mouth 2 (two) times daily. (Patient not taking: Reported on 09/06/2020) 60 tablet 12   VASCEPA 1 g capsule Take 2 capsules (2 g total) by mouth 2 (two) times daily. 360 capsule 3   verapamil (CALAN-SR) 240 MG CR tablet Take 1 tablet (240 mg total) by mouth at bedtime. 90 tablet 2   No facility-administered medications prior to visit.     PAST MEDICAL HISTORY: Past Medical History:  Diagnosis Date   Allergy    thru the year    Anxiety    Blurry vision, bilateral 08/26/2019   Coronary artery calcification    Diabetes mellitus (HCC)    borderline- no meds    Dilatation of aorta (HCC)    GERD (gastroesophageal reflux disease)    past hx- not current    Hepatic steatosis    HTN (hypertension)    Hypertriglyceridemia    Inappropriate sinus tachycardia    Morbid obesity ([place])    Sleep apnea    wears cpap nightly      PAST SURGICAL HISTORY: Past Surgical History:  Procedure Laterality Date   COLONOSCOPY     2 past colons- 1 civilian, 1 military - normal x 2 per pt    WISDOM TOOTH EXTRACTION  Bilateral    2000     FAMILY HISTORY: Family History  Problem Relation Age of Onset   Cancer Mother    Cancer Father    Diabetes Father    Colon cancer Father        dx'd in his 14's , died in 44's    Heart disease Maternal Grandfather        diagnosed 84s   Heart disease Paternal Grandfather        diagnosed 40s   Colon polyps Neg Hx    Esophageal cancer Neg Hx    Rectal cancer Neg Hx    Stomach cancer Neg Hx      SOCIAL HISTORY: Social History   Socioeconomic History   Marital status: Single    Spouse name: Not on file   Number of children: 0   Years of education: Not on file   Highest education level: Associate degree: academic program   Occupational History    Comment: Patent attorney  Tobacco Use   Smoking status: Never   Smokeless tobacco: Never  Vaping Use   Vaping Use: Never used  Substance and Sexual Activity   Alcohol use: Never   Drug use: Never   Sexual activity: Not on file  Other Topics Concern   Not on file  Social History Narrative   Not on file   Social Determinants of Health   Financial Resource Strain: Not on file  Food Insecurity: Not on file  Transportation Needs: Not on file  Physical Activity: Not on file  Stress: Not on file  Social Connections: Not on file  Intimate Partner Violence: Not on file     PHYSICAL EXAM  There were no vitals filed for this visit. There is no height or weight on file to calculate BMI.   Generalized: Well developed, in no acute distress  Cardiology: normal rate and rhythm, no murmur auscultated  Respiratory: clear to auscultation bilaterally    Neurological examination  Mentation: Alert oriented to time, place, history taking. Follows all commands speech and language fluent Cranial nerve II-XII: Pupils were equal round reactive to light. Extraocular movements were full, visual field were full on confrontational test. Facial sensation and strength were normal. Uvula tongue midline. Head turning and shoulder shrug  were normal and symmetric. Motor: The motor testing reveals 5 over 5 strength of all 4 extremities. Good symmetric motor tone is noted throughout.  Sensory: Sensory testing is intact to soft touch on all 4 extremities. No evidence of extinction is noted.  Coordination: Cerebellar testing reveals good finger-nose-finger and heel-to-shin bilaterally.  Gait and station: Gait is normal. Tandem gait is normal. Romberg is negative. No drift is seen.  Reflexes: Deep tendon reflexes are symmetric and normal bilaterally.    DIAGNOSTIC DATA (LABS, IMAGING, TESTING) - I reviewed patient records, labs, notes, testing and imaging myself where  available.  Lab Results  Component Value Date   WBC 7.7 07/15/2019   HGB 14.1 07/15/2019   HCT 43.4 07/15/2019   MCV 93.7 07/15/2019   PLT 233 07/15/2019      Component Value Date/Time   NA 139 06/28/2020 1417   NA 142 03/25/2020 1000   K 4.3 06/28/2020 1417   CL 102 06/28/2020 1417   CO2 25 06/28/2020 1417   GLUCOSE 122 (H) 06/28/2020 1417   BUN 15 06/28/2020 1417   BUN 15 03/25/2020 1000   CREATININE 1.15 06/28/2020 1417   CALCIUM 9.5 06/28/2020 1417   PROT 7.9 12/13/2020  1222   ALBUMIN 5.1 (H) 12/13/2020 1222   AST 25 12/13/2020 1222   ALT 32 12/13/2020 1222   ALKPHOS 41 (L) 12/13/2020 1222   BILITOT 0.4 12/13/2020 1222   GFRNONAA >60 06/28/2020 1417   GFRAA 106 03/25/2020 1000   Lab Results  Component Value Date   CHOL 165 12/13/2020   HDL 38 (L) 12/13/2020   LDLCALC 74 12/13/2020   LDLDIRECT 90 12/13/2020   TRIG 333 (H) 12/13/2020   CHOLHDL 4.3 12/13/2020   Lab Results  Component Value Date   HGBA1C 6.1 03/11/2020   No results found for: [id number] Lab Results  Component Value Date   TSH 2.400 11/06/2018    No flowsheet data found.   No flowsheet data found.   ASSESSMENT AND PLAN  50 y.o. year old male  has a past medical history of Allergy, Anxiety, Blurry vision, bilateral (08/26/2019), Coronary artery calcification, Diabetes mellitus ([place]), Dilatation of aorta (HCC), GERD (gastroesophageal reflux disease), Hepatic steatosis, HTN (hypertension), Hypertriglyceridemia, Inappropriate sinus tachycardia, Morbid obesity ([place]), and Sleep apnea. here with    No diagnosis found.   No orders of the defined types were placed in this encounter.    No orders of the defined types were placed in this encounter.     Debbora Presto, MSN, FNP-C 01/19/2021, 3:42 PM  [facility] [address], [place], [place] [address] [phone]

## 2021-01-24 ENCOUNTER — Ambulatory Visit: Payer: BC Managed Care – PPO | Admitting: Family Medicine

## 2021-01-24 DIAGNOSIS — G43109 Migraine with aura, not intractable, without status migrainosus: Secondary | ICD-10-CM

## 2021-02-28 ENCOUNTER — Other Ambulatory Visit: Payer: BC Managed Care – PPO | Admitting: *Deleted

## 2021-02-28 ENCOUNTER — Other Ambulatory Visit: Payer: Self-pay

## 2021-02-28 DIAGNOSIS — Z79899 Other long term (current) drug therapy: Secondary | ICD-10-CM

## 2021-02-28 LAB — COMPREHENSIVE METABOLIC PANEL
ALT: 45 U/L — ABNORMAL HIGH (ref 0–44)
AST: 32 U/L (ref 15–41)
Albumin: 4.4 g/dL (ref 3.5–5.0)
Alkaline Phosphatase: 39 U/L (ref 38–126)
Anion gap: 7 (ref 5–15)
BUN: 11 mg/dL (ref 6–20)
CO2: 27 mmol/L (ref 22–32)
Calcium: 10 mg/dL (ref 8.9–10.3)
Chloride: 105 mmol/L (ref 98–111)
Creatinine, Ser: 1.08 mg/dL (ref 0.61–1.24)
GFR, Estimated: >60 mL/min (ref >60)
Glucose, Bld: 107 mg/dL — ABNORMAL HIGH (ref 70–99)
Potassium: 4.2 mmol/L (ref 3.5–5.1)
Sodium: 139 mmol/L (ref 135–145)
Total Bilirubin: 0.3 mg/dL (ref 0.3–1.2)
Total Protein: 7.3 g/dL (ref 6.5–8.1)

## 2021-03-01 ENCOUNTER — Ambulatory Visit (INDEPENDENT_AMBULATORY_CARE_PROVIDER_SITE_OTHER)
Admission: RE | Admit: 2021-03-01 | Discharge: 2021-03-01 | Disposition: A | Discharge status: Home / Self Care | Payer: BC Managed Care – PPO | Source: Ambulatory Visit | Attending: Cardiovascular Disease | Admitting: Cardiovascular Disease

## 2021-03-01 DIAGNOSIS — I77810 Thoracic aortic ectasia: Secondary | ICD-10-CM

## 2021-03-01 IMAGING — CT CT ANGIO CHEST
2 of 8 series · 16 of 46 positions shown · IV contrast (OMNIPAQUE 350)
Comparison: Chest CT-[DATE]

CLINICAL DATA: Evaluate aortic root dilatation.

EXAM:
CT ANGIOGRAPHY CHEST WITH CONTRAST
TECHNIQUE: Multidetector CT imaging of the chest was performed using the
standard protocol during bolus administration of intravenous
contrast. Multiplanar CT image reconstructions and MIPs were
obtained to evaluate the vascular anatomy.
CONTRAST:  100mL OMNIPAQUE IOHEXOL 350 MG/ML SOLN

[Series 4: aorta 3.0 bf37 2 · axial · 0.83mm/px · z∈[-324,-40]mm · 13 of 111 slices shown]
[im 8/111  lung]
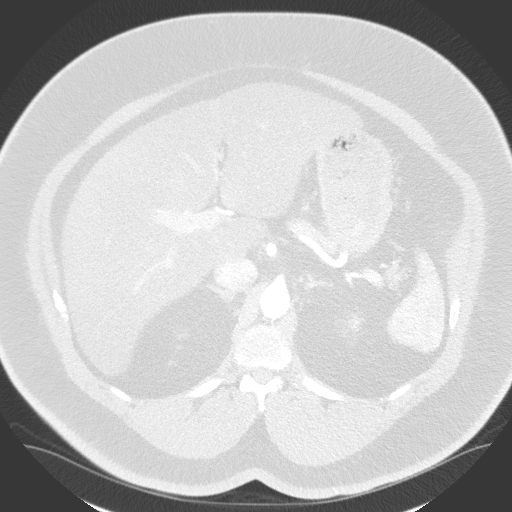
[im 16/111  soft-tissue]
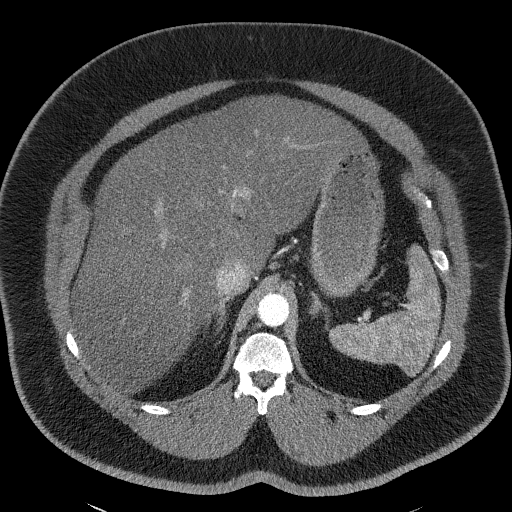
[im 24/111  lung]
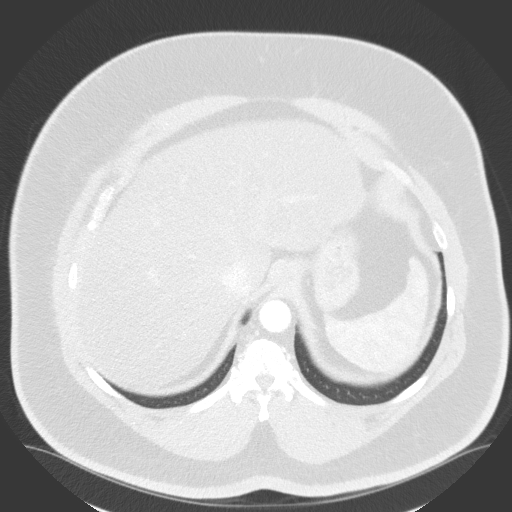
[im 32/111  soft-tissue]
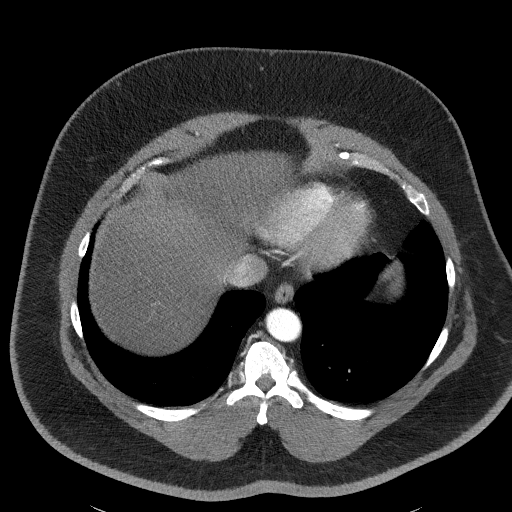
[im 40/111  lung]
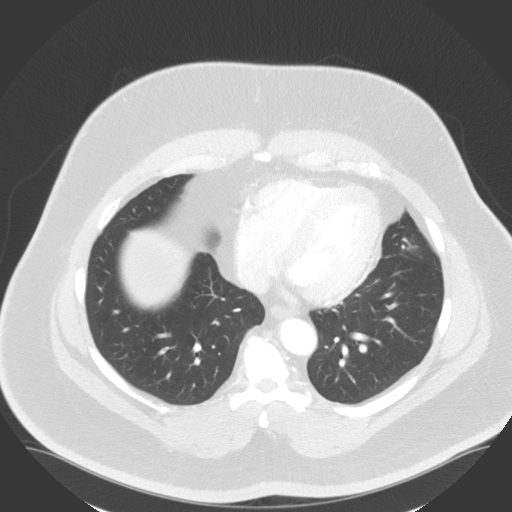
[im 48/111  soft-tissue]
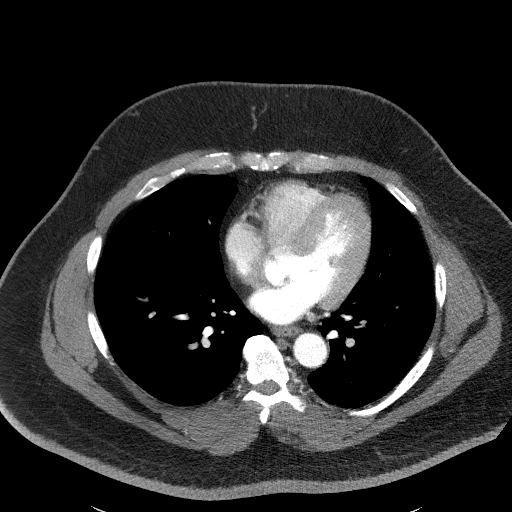
[im 56/111  lung]
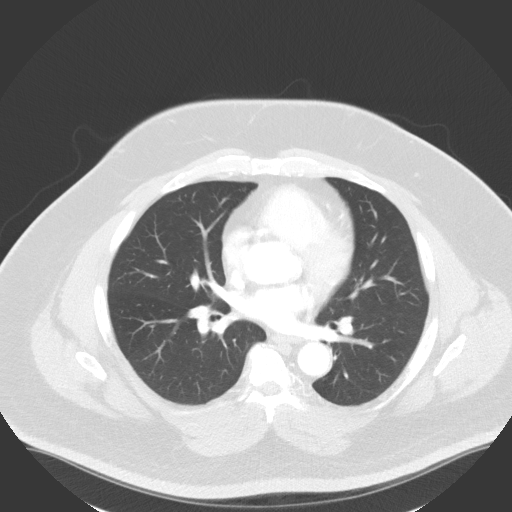
[im 63/111  soft-tissue]
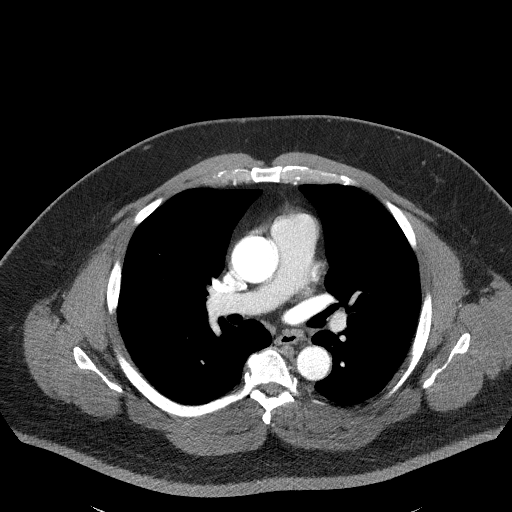
[im 71/111  lung]
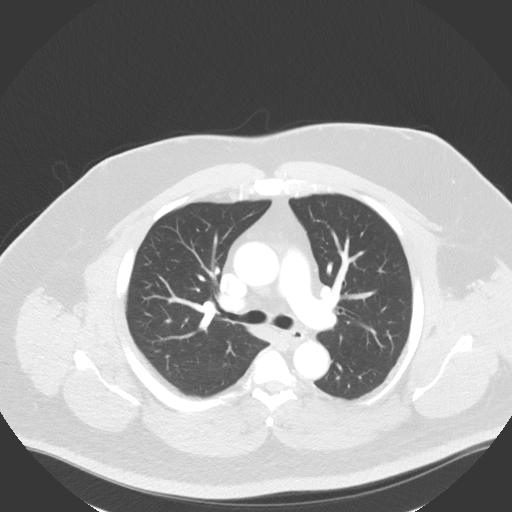
[im 79/111  soft-tissue]
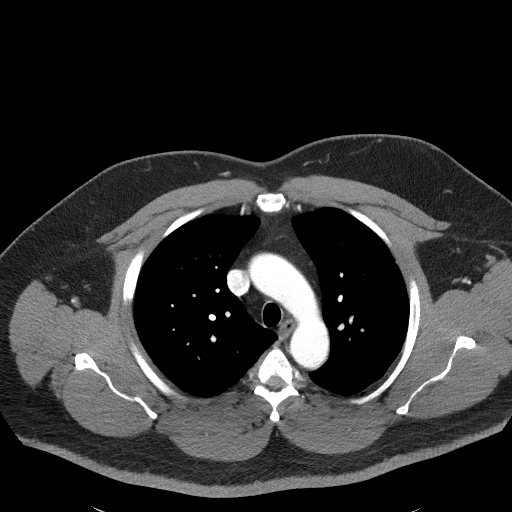
[im 87/111  lung]
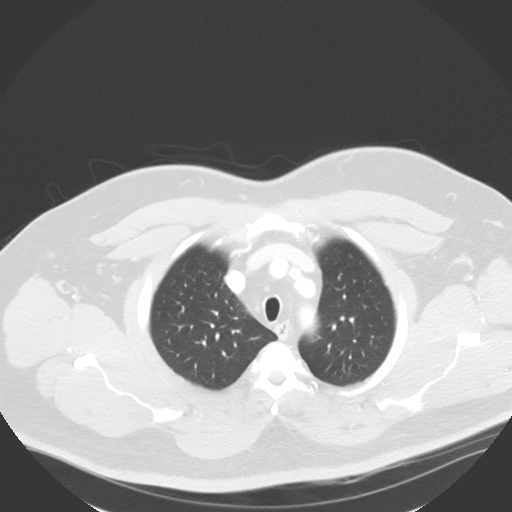
[im 95/111  soft-tissue]
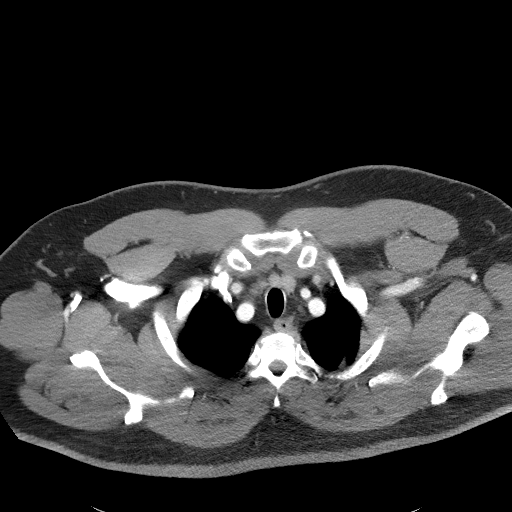
[im 103/111  lung]
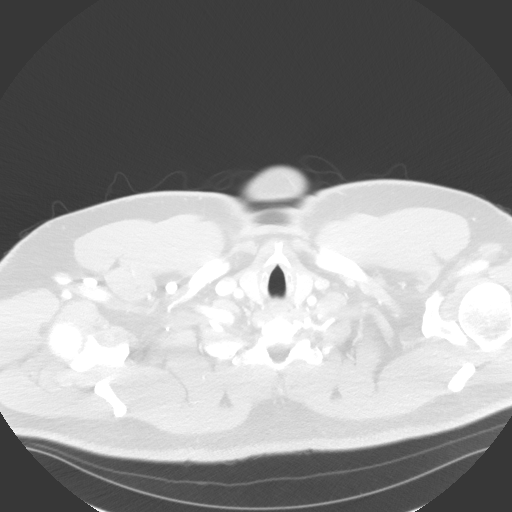

[Series 7: coronals · coronal · 0.67mm/px · 3 of 154 slices shown]
[im 39/154  soft-tissue]
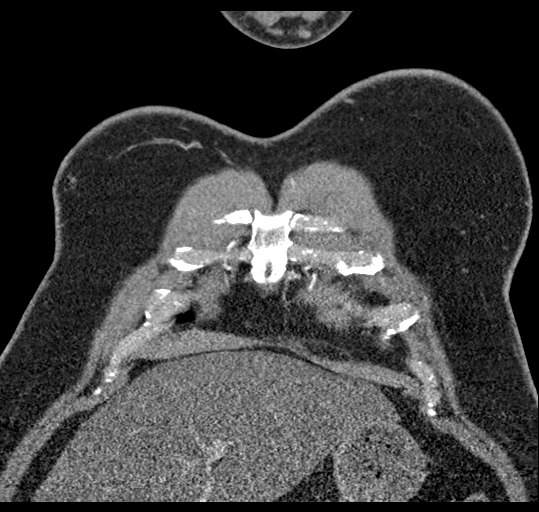
[im 77/154  soft-tissue]
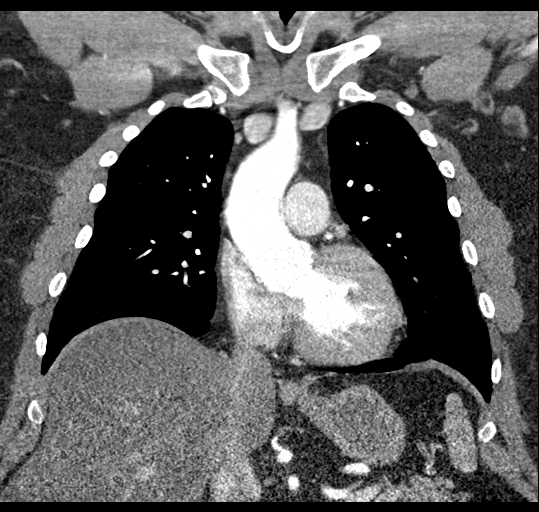
[im 115/154  soft-tissue]
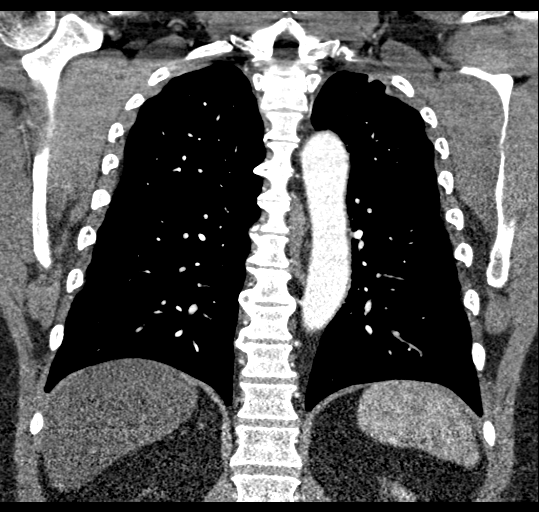

[16 of 46 positions shown; findings below may reference images not displayed]

FINDINGS: Vascular Findings:

Vascular Findings:

Mild fusiform ectasia of the ascending thoracic aorta with
measurements as follows. The thoracic aorta tapers to a normal
caliber at the level of the aortic arch. There is a minimal amount
atherosclerotic plaque involving the undersurface of the aortic
arch, not resulting in hemodynamically significant stenosis. No
evidence of thoracic aortic dissection or periaortic stranding on
this nongated examination.

Conventional configuration of the aortic arch. The branch vessels of
the aortic arch appear patent throughout their imaged courses.

Normal heart size. No pericardial effusion though a small amount of
fluid is seen with the pericardial recess. Coronary artery
calcifications.

Although this examination was not tailored for the evaluation the
pulmonary arteries, there are no discrete filling defects within the
central pulmonary arterial tree to suggest central pulmonary
embolism. Normal caliber of the main pulmonary artery.

-------------------------------------------------------------

Thoracic aortic measurements:

AORTIC ROOT: 45 mm as measured in greatest oblique short axis
coronal diameter (coronal image 80, series 7), unchanged compared to
the [DATE] examination

SINOTUBULAR JUNCTION: 36 mm as measured in greatest oblique short
axis coronal dimension.

PROXIMAL ASCENDING THORACIC AORTA: 39 mm as measured in greatest
oblique short axis axial dimension at the level of the main
pulmonary artery (axial image 49, series 4) and approximately 38 mm
in greatest oblique short axis coronal diameter (coronal image 77,
series 7), unchanged compared to the [DATE] examination.

AORTIC ARCH: 29 mm as measured in greatest oblique short axis
sagittal dimension.

PROXIMAL DESCENDING THORACIC AORTA: 29 mm as measured in greatest
oblique short axis axial dimension at the level of the main
pulmonary artery.

DISTAL DESCENDING THORACIC AORTA: 27 mm as measured in greatest
oblique short axis axial dimension at the level of the diaphragmatic
hiatus.

Review of the MIP images confirms the above findings.

----------------------------------------------------------------------------------

Nonvascular Findings:

Mediastinum/Lymph Nodes: No bulky mediastinal, hilar or axillary
lymphadenopathy. Submental and submandibular chain lymph nodes are
not enlarged by size criteria.

Lungs/Pleura: No focal airspace opacities. No pleural effusion or
pneumothorax. The central pulmonary airways appear widely patent.

No discrete pulmonary nodules.

Upper abdomen: Limited early arterial phase evaluation of the upper
abdomen demonstrates diffuse decreased attenuation hepatic
parenchyma suggestive of hepatic steatosis.

Musculoskeletal: No acute or aggressive osseous abnormalities.
Stigmata of dish within the mid and caudal aspects of the thoracic
spine. Regional soft tissues appear normal. Normal appearance of the
thyroid gland.
IMPRESSION: 1. Stable mild uncomplicated ectasia of the ascending thoracic aorta
measuring 39 mm in diameter, unchanged compared to the [DATE]
examination.
2. Coronary artery calcifications. Aortic Atherosclerosis
([G3]-[G3]).
3. Suspected hepatic steatosis.  Correlation with LFTs is advised.

## 2021-03-01 MED ORDER — IOHEXOL 350 MG/ML SOLN
100.0000 mL | Freq: Once | INTRAVENOUS | Status: AC | PRN
  Administered 2021-03-01: 100 mL via INTRAVENOUS

## 2021-03-04 ENCOUNTER — Ambulatory Visit: Payer: BC Managed Care – PPO | Admitting: Family Medicine

## 2021-03-06 NOTE — Patient Instructions (Signed)
It was wonderful to see you today.  Please bring ALL of your medications with you to every visit.   Today we talked about:  -You received your Flu vaccine today. You are also due for a Shinglex vaccine, I have sent it to your pharmacy.   -Your Hemoglobin A1c was 6.6 today, which is below the goal of 7. -Continue your efforts towards weight loss. Below is the information on our dietician. Please contact her if you decide this is something you want to pursue, I think it would help. For weight management: Call Dr. Jenne Campus (our nutritionist) to set up an appointment. Her phone number is: [phone]. -I am starting you on a new medication called Metoprolol. It is a twice a day medication. Please return in 2 weeks for a follow up on your blood pressure or call our nurse line to report your readings.   Thank you for choosing [facility].   Please call [phone] with any questions about today's appointment.  Please be sure to schedule follow up at the front  desk before you leave today.   Sharion Settler, DO PGY-2 Family Medicine

## 2021-03-06 NOTE — Progress Notes (Signed)
SUBJECTIVE:   CHIEF COMPLAINT / HPI:   Follow up Leg and Back Pain Tayshaun Kroh is an 50 y.o. male who presents to clinic today to discuss ongoing leg and back pain. 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  He also states that his neurologist told him he had a pinched nerve in his back, he is unsure which level. He was previously seen for this in June and encouraged towards weight loss for ultimate long-term treatment. Also encouraged conservative measures with ice/heat/Voltaren gel/Tylenol. It is most bothersome when he stands and moves around. Improves when he lays down. For the pain he uses Tyelnol, Icy hot, Aspercreme, soaks in tub, and has tried a brace, though "it was not designed for big people". Benefits from 1-2 days off from work as needed weekly to help get him off of his feet.   Right Shoulder Blade Pain Noticed it last Thursday. It is intermittent. He denies any injuries. States he sleeps on the left-side. Has been using Tylenol, Icy/Hot. Sometimes radiates to his central chest. No shortness of breath.   Hypertension On Verapamil 240 mg, Losartan 100 mg, and clonidine 0.1 mg daily. Previously did not tolerate BB due to weakness/fatigue and diuretic previously not started due elevated creatinine. Home BP's have been ranging 147-829 systolic over 56-213, HR 08-657.  He occasionally has occipital headaches, and notices temporary blurred vision that clears on its own.  He denies any swelling, shortness of breath, chest pain. He follows with a cardiologist but states that we manage his blood pressure.  Type 2 DM Last Hgb A1c in Oct 2021 was 6.1. He is diet-controlled. Is on an ARB and high-intensity statin. He is overdue for Hgb A1c and foot examination. He states he had his eye exam in June.   PERTINENT  PMH / PSH:  Past Medical History:  Diagnosis Date   Allergy    thru the year    Anxiety    Blurry vision, bilateral 08/26/2019    Coronary artery calcification    Diabetes mellitus (HCC)    borderline- no meds    Dilatation of aorta (HCC)    GERD (gastroesophageal reflux disease)    past hx- not current    Hepatic steatosis    HTN (hypertension)    Hypertriglyceridemia    Inappropriate sinus tachycardia    Morbid obesity (HCC)    Sleep apnea    wears cpap nightly     OBJECTIVE:   BP (!) 144/80   Pulse (!) 101   Ht 5\' 9"  (1.753 m)   Wt (!) 315 lb 12.8 oz (143.2 kg)   SpO2 96%   BMI 46.64 kg/m    General: NAD, pleasant, able to participate in exam Cardiac: RRR, no murmurs. Respiratory: CTAB, normal effort, No wheezes, rales or rhonchi Abdomen: Obese abdomen MSK:   Inspection: Dry skin throughout back but without any deformities, swelling or erythema.  Palpation: Tenderness to the left of T7 around inferior left scapula  ROM: Decreased active ROM of back, hamstrings and quads b/l. Normal ROM of b/l arms in flexion and extension. Decreased active ROM of external rotation and adduction b/l Special Tests: Negative straight leg test b/l, +Thomas test b/l Extremities: no edema or cyanosis. Skin: warm and dry, onychomycosis of left toes, thickened skin on back Psych: Normal affect and mood  ASSESSMENT/PLAN:   Chronic low back pain Ongoing for last several years.  Notes that he had  recent imaging done this summer that showed a pinched nerve, however, I am unable to review this.  Examination notable for decreased AROM 2/2 tight muscles and overall decreased flexibility. Negative straight leg tests. We discussed the importance of weight loss to help his overall health.  I offered him referral to our dietitian, however he states he would like to think about this.  I offered him referral to physical therapy, however, he would like to think about this.  I do believe he would benefit from strength exercises, increase flexibility, and weight loss.  No need for repeat imaging at this time.  Patient to contact us if he  would like to pursue physical therapy.  Numbness of right anterior thigh Previously has seen neurology for this, thought to be due to a meralgia paresthetica.  We discussed not wearing tight pants/belts.  We discussed weight loss as above. Patient was provided with contact information for dietitian, should he decide to pursue this.   HTN (hypertension) Appears to be compliant with his medications of losartan 100 mg, clonidine 0.1 mg, verapamil 240 mg.  He brings in blood pressure log today which is significant for elevated blood pressures and elevated heart rates, ranging 536U to 440H systolic, 47Q to 259 diastolic, heart rate 56L to 113. Again, we discussed how weight loss could improve his BP. Given his continued elevated BP's and HR, I'm starting him on Metoprolol 25 mg BID.  Per chart review, appears he was previously started on beta-blockers but cannot tolerate due to fatigue. We discussed this and shared decision making to re-trial as I do think it could be beneficial.  Discussed follow-up in 2 weeks for blood pressure recheck.  OSA (obstructive sleep apnea) Wear CPAP nightly. Should continue this.  Diabetes mellitus without complication (HCC) Hemoglobin A1c 6.6 today, at goal.  He is diet controlled, not on any medications.  Could consider starting GLP-1 for both diabetes and weight loss.  Diabetic foot exam today only notable for thickened toes of his left foot and callused feet.  Sensation is intact.  He is up-to-date on his eye exam and on a high intensity statin.  CAD (coronary artery disease) Coronary artery calcification seen on CT angio performed this month.  He is on a high intensity statin but not on aspirin.  Sent a prescription for 81 mg of aspirin to be taken daily.  Muscle strain Likely has a muscle strain causing pain by his right scapula.  There were no examination findings that would be concerning for bony etiology.  He had tenderness in the muscle belly of infraspinatus.  Declined PT referral. Can continue conservative treatment with Tylenol/Motrin, voltaren gel. Encouraged stretches and movement.   Health maintenance examination Received flu vaccine and COVID booster today.    Sharion Settler, [place]

## 2021-03-08 ENCOUNTER — Ambulatory Visit (INDEPENDENT_AMBULATORY_CARE_PROVIDER_SITE_OTHER): Payer: BC Managed Care – PPO

## 2021-03-08 ENCOUNTER — Ambulatory Visit (INDEPENDENT_AMBULATORY_CARE_PROVIDER_SITE_OTHER): Payer: BC Managed Care – PPO | Admitting: Family Medicine

## 2021-03-08 ENCOUNTER — Other Ambulatory Visit: Payer: Self-pay

## 2021-03-08 VITALS — BP 144/80 | HR 101 | Ht 69.0 in | Wt 315.8 lb

## 2021-03-08 DIAGNOSIS — E119 Type 2 diabetes mellitus without complications: Secondary | ICD-10-CM

## 2021-03-08 DIAGNOSIS — T148XXA Other injury of unspecified body region, initial encounter: Secondary | ICD-10-CM

## 2021-03-08 DIAGNOSIS — G4733 Obstructive sleep apnea (adult) (pediatric): Secondary | ICD-10-CM

## 2021-03-08 DIAGNOSIS — I2510 Atherosclerotic heart disease of native coronary artery without angina pectoris: Secondary | ICD-10-CM | POA: Insufficient documentation

## 2021-03-08 DIAGNOSIS — I2584 Coronary atherosclerosis due to calcified coronary lesion: Secondary | ICD-10-CM

## 2021-03-08 DIAGNOSIS — Z0000 Encounter for general adult medical examination without abnormal findings: Secondary | ICD-10-CM | POA: Diagnosis not present

## 2021-03-08 DIAGNOSIS — G8929 Other chronic pain: Secondary | ICD-10-CM

## 2021-03-08 DIAGNOSIS — M545 Low back pain, unspecified: Secondary | ICD-10-CM

## 2021-03-08 DIAGNOSIS — Z23 Encounter for immunization: Secondary | ICD-10-CM

## 2021-03-08 DIAGNOSIS — R200 Anesthesia of skin: Secondary | ICD-10-CM

## 2021-03-08 DIAGNOSIS — I10 Essential (primary) hypertension: Secondary | ICD-10-CM

## 2021-03-08 LAB — POCT GLYCOSYLATED HEMOGLOBIN (HGB A1C): HbA1c, POC (controlled diabetic range): 6.6 % (ref 0.0–7.0)

## 2021-03-08 MED ORDER — ZOSTER VAC RECOMB ADJUVANTED 50 MCG/0.5ML IM SUSR: 0.5000 mL | Freq: Once | INTRAMUSCULAR | 0 refills | Status: AC

## 2021-03-08 MED ORDER — METOPROLOL TARTRATE 25 MG PO TABS: 25.0000 mg | ORAL_TABLET | Freq: Two times a day (BID) | ORAL | 1 refills | Status: DC

## 2021-03-08 MED ORDER — ASPIRIN EC 81 MG PO TBEC: 81.0000 mg | DELAYED_RELEASE_TABLET | Freq: Every day | ORAL | 11 refills | Status: DC

## 2021-03-08 NOTE — Assessment & Plan Note (Signed)
Received flu vaccine and COVID booster today.

## 2021-03-08 NOTE — Assessment & Plan Note (Signed)
Coronary artery calcification seen on CT angio performed this month.  He is on a high intensity statin but not on aspirin.  Sent a prescription for 81 mg of aspirin to be taken daily.

## 2021-03-08 NOTE — Assessment & Plan Note (Signed)
Ongoing for last several years.  Notes that he had recent imaging done this summer that showed a pinched nerve, however, I am unable to review this.  Examination notable for decreased AROM 2/2 tight muscles and overall decreased flexibility. Negative straight leg tests. We discussed the importance of weight loss to help his overall health.  I offered him referral to our dietitian, however he states he would like to think about this.  I offered him referral to physical therapy, however, he would like to think about this.  I do believe he would benefit from strength exercises, increase flexibility, and weight loss.  No need for repeat imaging at this time.  Patient to contact us if he would like to pursue physical therapy.

## 2021-03-08 NOTE — Assessment & Plan Note (Signed)
Likely has a muscle strain causing pain by his right scapula.  There were no examination findings that would be concerning for bony etiology.  He had tenderness in the muscle belly of infraspinatus. Declined PT referral. Can continue conservative treatment with Tylenol/Motrin, voltaren gel. Encouraged stretches and movement.

## 2021-03-08 NOTE — Assessment & Plan Note (Signed)
Appears to be compliant with his medications of losartan 100 mg, clonidine 0.1 mg, verapamil 240 mg.  He brings in blood pressure log today which is significant for elevated blood pressures and elevated heart rates, ranging 090B to 014F systolic, 96L to 249 diastolic, heart rate 32U to 113. Again, we discussed how weight loss could improve his BP. Given his continued elevated BP's and HR, I'm starting him on Metoprolol 25 mg BID.  Per chart review, appears he was previously started on beta-blockers but cannot tolerate due to fatigue. We discussed this and shared decision making to re-trial as I do think it could be beneficial.  Discussed follow-up in 2 weeks for blood pressure recheck.

## 2021-03-08 NOTE — Assessment & Plan Note (Signed)
Hemoglobin A1c 6.6 today, at goal.  He is diet controlled, not on any medications.  Could consider starting GLP-1 for both diabetes and weight loss.  Diabetic foot exam today only notable for thickened toes of his left foot and callused feet.  Sensation is intact.  He is up-to-date on his eye exam and on a high intensity statin.

## 2021-03-08 NOTE — Assessment & Plan Note (Signed)
Wear CPAP nightly. Should continue this.

## 2021-03-08 NOTE — Assessment & Plan Note (Signed)
Previously has seen neurology for this, thought to be due to a meralgia paresthetica.  We discussed not wearing tight pants/belts.  We discussed weight loss as above. Patient was provided with contact information for dietitian, should he decide to pursue this.

## 2021-03-23 ENCOUNTER — Other Ambulatory Visit: Payer: Self-pay | Admitting: General Practice

## 2021-03-25 ENCOUNTER — Other Ambulatory Visit: Payer: Self-pay | Admitting: Family Medicine

## 2021-03-25 DIAGNOSIS — R000 Tachycardia, unspecified: Secondary | ICD-10-CM

## 2021-03-28 NOTE — Patient Instructions (Signed)
Below is our plan:  We will stop topiramate. Decrease dose to 1 tablet daily for 7 days then 1 tablet every other day for 7 days. Start gabapentin 300mg  30 minutes before you go to sleep. Follow up with PCP as directed. Consider imaging and PT if needed.   Please make sure you are staying well hydrated. I recommend 50-60 ounces daily. Well balanced diet and regular exercise encouraged. Consistent sleep schedule with 6-8 hours recommended.   Please continue follow up with care team as directed.   Follow up with me in 6 months   You may receive a survey regarding today's visit. I encourage you to leave honest feed back as I do use this information to improve patient care. Thank you for seeing me today!

## 2021-03-28 NOTE — Progress Notes (Signed)
Chief Complaint  Patient presents with   Follow-up    Rm 11, alone. Here for migraine f/u. Pt reports migraines are about the same. Pt continues to have pn and blurriness in eyes, only lasting a short period of time. Pt repots having shock waves on right side of body. Started top of the head to the bottom part of R knee, has only happened once.      HISTORY OF PRESENT ILLNESS:  03/29/21 ALL:  Rodney Lane is a 50 y.o. male here today for follow up for migraines. He was started on topiramate 50mg  twice daily. He does not feel that this has helped. He continues to have 4-8 headache days per month with 1-2 migrainous headaches. Rizatriptan has not helped. He has experienced more shock like waves throughout body. Mostly on right side. He was advised to follow up with PCP for concerns of right arm and right thigh numbness. He has not had a chance to check in with PCP. He was given a number to call for PT but has not reached out. He has tried to loosen belt. Pain is worse when at work and standing up, better when lying down. He is using CPAP most every day. He works nights.    HISTORY (copied from Dr Gladstone Lighter previous note)  50 year old male with headaches, arm pain, leg pain.   Patient reports occipital and right-sided headaches with dizziness, nausea, photophobia and blurred vision for past 5 years, 3-4 times a month.  Patient has family history of migraine in mother.  He has never been officially diagnosed with migraine.  He takes over-the-counter medications with mild relief.   Patient also having intermittent shooting pains and tingling sensation in right greater than left arms.  Also has right hip pain, numbness in the right thigh, with chronic low back pain since 1993.   Patient also has borderline diabetes, hypertension, hypertriglyceridemia, sleep apnea, obesity, anxiety and depression.   REVIEW OF SYSTEMS: Out of a complete 14 system review of symptoms, the patient complains only  of the following symptoms, headaches, numbness, pain and all other reviewed systems are negative.   ALLERGIES: No Known Allergies   HOME MEDICATIONS: Outpatient Medications Prior to Visit  Medication Sig Dispense Refill   aspirin EC 81 MG tablet Take 1 tablet (81 mg total) by mouth daily. Swallow whole. 30 tablet 11   cloNIDine (CATAPRES) 0.1 MG tablet Take 1 tablet (0.1 mg total) by mouth 2 (two) times daily. 180 tablet 3   fenofibrate (TRICOR) 145 MG tablet Take 1 tablet (145 mg total) by mouth daily. 90 tablet 3   fluticasone (FLONASE) 50 MCG/ACT nasal spray Place 2 sprays into both nostrils daily. 16 g 6   losartan (COZAAR) 100 MG tablet TAKE 1 TABLET BY MOUTH EVERY DAY 30 tablet 11   metoprolol tartrate (LOPRESSOR) 25 MG tablet Take 1 tablet (25 mg total) by mouth 2 (two) times daily. 60 tablet 1   rizatriptan (MAXALT-MLT) 10 MG disintegrating tablet Take 1 tablet (10 mg total) by mouth as needed for migraine. May repeat in 2 hours if needed 9 tablet 11   rosuvastatin (CRESTOR) 40 MG tablet Take 1 tablet (40 mg total) by mouth daily. 90 tablet 3   sertraline (ZOLOFT) 25 MG tablet Take 25 mg by mouth daily.     sildenafil (REVATIO) 20 MG tablet Take 3-5 tablets as needed. 60 tablet 2   topiramate (TOPAMAX) 50 MG tablet Take 1 tablet (50 mg total) by mouth 2 (  two) times daily. 60 tablet 12   VASCEPA 1 g capsule Take 2 capsules (2 g total) by mouth 2 (two) times daily. 360 capsule 3   verapamil (CALAN-SR) 240 MG CR tablet Take 1 tablet (240 mg total) by mouth at bedtime. 90 tablet 2   No facility-administered medications prior to visit.     PAST MEDICAL HISTORY: Past Medical History:  Diagnosis Date   Allergy    thru the year    Anxiety    Blurry vision, bilateral 08/26/2019   Coronary artery calcification    Diabetes mellitus (HCC)    borderline- no meds    Dilatation of aorta (HCC)    GERD (gastroesophageal reflux disease)    past hx- not current    Hepatic steatosis     HTN (hypertension)    Hypertriglyceridemia    Inappropriate sinus tachycardia    Morbid obesity (HCC)    Sleep apnea    wears cpap nightly      PAST SURGICAL HISTORY: Past Surgical History:  Procedure Laterality Date   COLONOSCOPY     2 past colons- 1 civilian, 1 military - normal x 2 per pt    WISDOM TOOTH EXTRACTION Bilateral    2000     FAMILY HISTORY: Family History  Problem Relation Age of Onset   Cancer Mother    Cancer Father    Diabetes Father    Colon cancer Father        dx'd in his 25's , died in 26's    Heart disease Maternal Grandfather        diagnosed 74s   Heart disease Paternal Grandfather        diagnosed 82s   Colon polyps Neg Hx    Esophageal cancer Neg Hx    Rectal cancer Neg Hx    Stomach cancer Neg Hx      SOCIAL HISTORY: Social History   Socioeconomic History   Marital status: Single    Spouse name: Not on file   Number of children: 0   Years of education: Not on file   Highest education level: Associate degree: academic program  Occupational History    Comment: Patent attorney  Tobacco Use   Smoking status: Never   Smokeless tobacco: Never  Vaping Use   Vaping Use: Never used  Substance and Sexual Activity   Alcohol use: Never   Drug use: Never   Sexual activity: Not on file  Other Topics Concern   Not on file  Social History Narrative   Not on file   Social Determinants of Health   Financial Resource Strain: Not on file  Food Insecurity: Not on file  Transportation Needs: Not on file  Physical Activity: Not on file  Stress: Not on file  Social Connections: Not on file  Intimate Partner Violence: Not on file     PHYSICAL EXAM  Vitals:   03/29/21 0808  BP: 139/88  Pulse: 82  Weight: (!) 316 lb (143.3 kg)  Height: 5\' 9"  (1.753 m)   Body mass index is 46.67 kg/m.  Generalized: Well developed, in no acute distress  Cardiology: normal rate and rhythm, no murmur auscultated  Respiratory: clear to  auscultation bilaterally    Neurological examination  Mentation: Alert oriented to time, place, history taking. Follows all commands speech and language fluent Cranial nerve II-XII: Pupils were equal round reactive to light. Extraocular movements were full, visual field were full on confrontational test. Facial sensation and strength were normal.  Head turning and shoulder shrug  were normal and symmetric. Motor: The motor testing reveals 5 over 5 strength of all 4 extremities. Good symmetric motor tone is noted throughout.  Sensory: Sensory testing is intact to soft touch on all 4 extremities. No evidence of extinction is noted.  Gait and station: Gait is normal.    DIAGNOSTIC DATA (LABS, IMAGING, TESTING) - I reviewed patient records, labs, notes, testing and imaging myself where available.  Lab Results  Component Value Date   WBC 7.7 07/15/2019   HGB 14.1 07/15/2019   HCT 43.4 07/15/2019   MCV 93.7 07/15/2019   PLT 233 07/15/2019      Component Value Date/Time   NA 139 02/28/2021 1520   NA 142 03/25/2020 1000   K 4.2 02/28/2021 1520   CL 105 02/28/2021 1520   CO2 27 02/28/2021 1520   GLUCOSE 107 (H) 02/28/2021 1520   BUN 11 02/28/2021 1520   BUN 15 03/25/2020 1000   CREATININE 1.08 02/28/2021 1520   CALCIUM 10.0 02/28/2021 1520   PROT 7.3 02/28/2021 1520   PROT 7.9 12/13/2020 1222   ALBUMIN 4.4 02/28/2021 1520   ALBUMIN 5.1 (H) 12/13/2020 1222   AST 32 02/28/2021 1520   ALT 45 (H) 02/28/2021 1520   ALKPHOS 39 02/28/2021 1520   BILITOT 0.3 02/28/2021 1520   BILITOT 0.4 12/13/2020 1222   GFRNONAA >60 02/28/2021 1520   GFRAA 106 03/25/2020 1000   Lab Results  Component Value Date   CHOL 165 12/13/2020   HDL 38 (L) 12/13/2020   LDLCALC 74 12/13/2020   LDLDIRECT 90 12/13/2020   TRIG 333 (H) 12/13/2020   CHOLHDL 4.3 12/13/2020   Lab Results  Component Value Date   HGBA1C 6.6 03/08/2021   No results found for: [id number] Lab Results  Component Value Date   TSH  2.400 11/06/2018    No flowsheet data found.   No flowsheet data found.   ASSESSMENT AND PLAN  50 y.o. year old male  has a past medical history of Allergy, Anxiety, Blurry vision, bilateral (08/26/2019), Coronary artery calcification, Diabetes mellitus ([place]), Dilatation of aorta (HCC), GERD (gastroesophageal reflux disease), Hepatic steatosis, HTN (hypertension), Hypertriglyceridemia, Inappropriate sinus tachycardia, Morbid obesity ([place]), and Sleep apnea. here with    Migraine with aura and without status migrainosus, not intractable  Meralgia paresthetica of right side  Alic does not feel that topiramate has helped with headaches. We will discontinue. I will start gabapentin 300mg  at bedtime. He was advised on potential side effects. He was encouraged to follow up with PCP for concerns of right sided pain. PCP recommended PT but he has not called to schedule. He was advised to consider reaching out to PT. Healthy lifestyle habits encouraged. He will follow up in 6 months, sooner if needed.    No orders of the defined types were placed in this encounter.    No orders of the defined types were placed in this encounter.     Debbora Presto, MSN, FNP-C 03/29/2021, 8:46 AM  [facility] [address], [place], [place] [address] [phone]

## 2021-03-29 ENCOUNTER — Ambulatory Visit: Payer: BC Managed Care – PPO | Admitting: Family Medicine

## 2021-03-29 ENCOUNTER — Encounter: Payer: Self-pay | Admitting: Family Medicine

## 2021-03-29 VITALS — BP 139/88 | HR 82 | Ht 69.0 in | Wt 316.0 lb

## 2021-03-29 DIAGNOSIS — G5711 Meralgia paresthetica, right lower limb: Secondary | ICD-10-CM

## 2021-03-29 DIAGNOSIS — G43109 Migraine with aura, not intractable, without status migrainosus: Secondary | ICD-10-CM | POA: Diagnosis not present

## 2021-03-29 MED ORDER — GABAPENTIN 300 MG PO CAPS: 300.0000 mg | ORAL_CAPSULE | Freq: Every day | ORAL | 1 refills | Status: DC

## 2021-05-05 ENCOUNTER — Other Ambulatory Visit: Payer: Self-pay | Admitting: Family Medicine

## 2021-05-26 ENCOUNTER — Encounter: Payer: Self-pay | Admitting: Family Medicine

## 2021-05-31 ENCOUNTER — Telehealth: Payer: Self-pay

## 2021-05-31 NOTE — Telephone Encounter (Signed)
Patient calls nurse line checking the status of FMLA forms. Patient reports he faxed these last week x2. I do not see anything in PCP box or in chart.   Will forward to PCP to see if these have been received. Also, I advised patient to upload on mychart in the event we still have not received.

## 2021-06-06 NOTE — Telephone Encounter (Signed)
Placed FMLA forms in PCP box for review. Patient has a PCP apt scheduled for 1/24.

## 2021-06-20 ENCOUNTER — Other Ambulatory Visit: Payer: Self-pay | Admitting: Physician Assistant

## 2021-06-21 ENCOUNTER — Other Ambulatory Visit: Payer: Self-pay

## 2021-06-21 ENCOUNTER — Ambulatory Visit: Payer: BC Managed Care – PPO | Admitting: Family Medicine

## 2021-06-21 ENCOUNTER — Encounter: Payer: Self-pay | Admitting: Family Medicine

## 2021-06-21 VITALS — BP 144/85 | HR 108 | Ht 69.0 in | Wt 315.0 lb

## 2021-06-21 DIAGNOSIS — Z6841 Body Mass Index (BMI) 40.0 and over, adult: Secondary | ICD-10-CM

## 2021-06-21 DIAGNOSIS — G43809 Other migraine, not intractable, without status migrainosus: Secondary | ICD-10-CM

## 2021-06-21 DIAGNOSIS — M5441 Lumbago with sciatica, right side: Secondary | ICD-10-CM

## 2021-06-21 DIAGNOSIS — I10 Essential (primary) hypertension: Secondary | ICD-10-CM | POA: Diagnosis not present

## 2021-06-21 DIAGNOSIS — G8929 Other chronic pain: Secondary | ICD-10-CM

## 2021-06-21 MED ORDER — SEMAGLUTIDE(0.25 OR 0.5MG/DOS) 2 MG/1.5ML ~~LOC~~ SOPN: 0.2500 mg | PEN_INJECTOR | SUBCUTANEOUS | 0 refills | Status: DC

## 2021-06-21 NOTE — Progress Notes (Signed)
° ° °  SUBJECTIVE:   CHIEF COMPLAINT / HPI:    Rodney Lane is a 51 year old who presents for follow up on HTN and nerve pain.   HTN: BP today 144/85. Currently taking Verapamil 240 mg, Losartan 100 mg, clonidine 0.1 mg daily, and Metoprolol 25mg  BID. Denies symptoms today but does state when blood pressure elevated he does have chest discomfort, headache, blurry vision   Eats grilled and baked food, states he does not have much of an appetite. Does endorse eating vegetables, does drink sprite and ginger ale and juice  Denies getting much physical activity in  Migraines: can just be sitting down when his head starts hurting, primarily in the back of his head, sometimes on certain sides. Endorses 2 headaches a week. Worsened a little bit with light,   Nerve pain: sciatic pain stopped, but now concentrated in right hip and numbs right thigh. States sitting down helps. Has tried icey hot, tylenol, biofreeze, states it helps for a couple of minutes and then returns. States this is occurring every day.   He requests FMLA due to his pain-allows him to take the days off when he isnt feeling well    OBJECTIVE:   BP (!) 144/85    Pulse (!) 108    Ht 5\' 9"  (1.753 m)    Wt (!) 315 lb (142.9 kg)    BMI 46.52 kg/m    Physical exam General: well appearing, NAD Cardiovascular: RRR, no murmurs Lungs: CTAB. Normal WOB Abdomen: soft, non-distended, non-tender Skin: warm, dry. No edema  ASSESSMENT/PLAN:   No problem-specific Assessment & Plan notes found for this encounter.   HTN BP 144/85. Asymptomatic. Currently taking Verapamil 240 mg, Losartan 100 mg, clonidine 0.1 mg daily, Metoprolol 25mg  BID. He brought in his blood pressure log from home which was primarily in the normotensive range, averaging about 130/80. Will not make any adjustments to his medication at this time.   Obesity BMI 46.52. Patient endorses trying to lose weight with eating more balanced meals, though he is not able to get  much exercise due to his chronic MSK pain. Starting patient on Ozempic .25mg  weekly injections. Side effects discussed. Will follow up in 4 weeks and increase dose if tolerating.   Chronic low back pain   Sciatica  Currently taking 300mg  at bedtime. Obesity contributing to pain and discussed with patient and has been discussed in previous visits. Since we started Ozempic will wait to make adjustments to his neuropathic medication. Can later consider increasing Gabapentin or adding Cymbalta or a different agent.   Migraines, chronic Endorses 2 headaches a week either on the back of his head or unilateral, worsened with light. Does endorse relief with Rizatriptan 10mg . Advised to continue medication, and discussed strict return precautions   [place]

## 2021-06-21 NOTE — Patient Instructions (Signed)
It was great seeing you today!  Today you were seen for follow-up on your blood pressure and nerve pain.  Given your blood pressures have been pretty good at home, we are not going to change anything at this time.  We will however start Ozempic 0.25 mg injection weekly to help with your weight loss which is ultimately also help with your blood pressure and nerve pain.  Please check-out at the front desk before leaving the clinic. I'd like to see you back in 1 month for follow-up to check on the medication, but if you need to be seen earlier than that for any new issues we're happy to fit you in, just give [place] a call!  Visit Reminders: - Stop by the pharmacy to pick up your prescriptions  - Continue to work on your healthy eating habits and incorporating exercise into your daily life.   Feel free to call with any questions or concerns at any time, at [phone].   Take care,  Dr. Shary Key [facility]

## 2021-06-23 ENCOUNTER — Other Ambulatory Visit (HOSPITAL_COMMUNITY): Payer: Self-pay

## 2021-06-23 ENCOUNTER — Telehealth: Payer: Self-pay

## 2021-06-23 NOTE — Telephone Encounter (Signed)
A Prior Authorization was initiated for this patients OZEMPIC through CoverMyMeds.   ATTACHED CHART NOTES FROM 06/21/21  Key: [id number]

## 2021-06-28 ENCOUNTER — Other Ambulatory Visit (HOSPITAL_COMMUNITY): Payer: Self-pay

## 2021-06-28 NOTE — Telephone Encounter (Signed)
Prior Auth for patients medication OZEMPIC denied by [facility] via CoverMyMeds.   Reason: Pt has not tried & failed insulin or metformin containing product, pt has no documented allergy or intolerance to insulin or metformin products, pt is not at risk for heart disease, heart failure, or kidney disease.  CoverMyMeds Key: [id number]

## 2021-06-28 NOTE — Telephone Encounter (Signed)
NEW prior auth submitted via covermymeds for OZEMPIC. Pt has 2 insurances. Primary insurance [place] is covering pt medication (copay is >$1000). Pt's secondary insurance, [place], is requiring a PA.  CoverMyMeds Key: [id number]

## 2021-07-04 ENCOUNTER — Other Ambulatory Visit (HOSPITAL_COMMUNITY): Payer: Self-pay

## 2021-07-04 NOTE — Telephone Encounter (Signed)
Prior Auth for patients medication OZEMPIC approved by [employer] [place] from 06/28/21 to 06/28/22.  Key:  [id number]  Patients pharmacy notified.

## 2021-07-13 ENCOUNTER — Other Ambulatory Visit: Payer: Self-pay | Admitting: Family Medicine

## 2021-07-21 ENCOUNTER — Other Ambulatory Visit: Payer: Self-pay | Admitting: Physician Assistant

## 2021-08-08 ENCOUNTER — Ambulatory Visit (INDEPENDENT_AMBULATORY_CARE_PROVIDER_SITE_OTHER): Payer: BC Managed Care – PPO | Admitting: Family Medicine

## 2021-08-08 ENCOUNTER — Other Ambulatory Visit: Payer: Self-pay

## 2021-08-08 ENCOUNTER — Encounter: Payer: Self-pay | Admitting: Family Medicine

## 2021-08-08 VITALS — BP 138/95 | HR 93 | Wt 315.2 lb

## 2021-08-08 DIAGNOSIS — G8929 Other chronic pain: Secondary | ICD-10-CM

## 2021-08-08 DIAGNOSIS — G43809 Other migraine, not intractable, without status migrainosus: Secondary | ICD-10-CM

## 2021-08-08 DIAGNOSIS — M5441 Lumbago with sciatica, right side: Secondary | ICD-10-CM

## 2021-08-08 MED ORDER — TOPIRAMATE 50 MG PO TABS: 50.0000 mg | ORAL_TABLET | Freq: Two times a day (BID) | ORAL | 0 refills | Status: DC

## 2021-08-08 MED ORDER — RIZATRIPTAN BENZOATE 10 MG PO TBDP: 10.0000 mg | ORAL_TABLET | ORAL | 11 refills | Status: DC | PRN

## 2021-08-08 NOTE — Patient Instructions (Signed)
It was great seeing you today! ? ?For your migraines I recommend restarting Topamax to prevent the migraines from occurring, and I am also refilling your rizatriptan which can help when the migraines actually occur.  I still recommend following up with neurology at your May appointment. ? ?For your back pain, we started another medication called Cymbalta 30 mg daily to help with that nerve pain.  I also referred you to sports medicine clinic, they should call you back in the next couple of weeks to get an appointment scheduled. ? ?Visit Reminders: ?- Stop by the pharmacy to pick up your prescriptions  ?- Continue to work on your healthy eating habits and incorporating exercise into your daily life.  ? ?Feel free to call with any questions or concerns at any time, at [phone]. ?  ?Take care,  ?Dr. Shary Key ?[place]  ?

## 2021-08-08 NOTE — Progress Notes (Unsigned)
° ° °  SUBJECTIVE:   CHIEF COMPLAINT / HPI:   States blood pressure was high 2 weeks ago but is thrown of bytoday's numbers.   State still having migraines from eyes gong down to jaw. States this occurs at least 2-3 times a week. Left work Friday, turned off lights which was helpful and sat in the tub. States he has tried excedrin migraine and it helps for a few min. Deneis nausea or throwing up, mainly just pain around eye sockets.   Last aic 6.6 5 months ago. BMI 46.55. Insurance denied Ozempic   Also still with nerve pain shoots down to right mid thigh and up to right shoulder blade at least twice a day. Worse with standing, walking, sitting. Does not hurt when laying down Still taking Gabapentin at bedtime and does have relief.  Declines trying PT  120-135/79-93   F/u ozempic   PERTINENT  PMH / PSH: ***  OBJECTIVE:   BP (!) 138/95    Pulse 93    Wt (!) 315 lb 3.2 oz (143 kg)    SpO2 98%    BMI 46.55 kg/m   ***  ASSESSMENT/PLAN:   No problem-specific Assessment & Plan notes found for this encounter.   Sciatica Will add on Cymbalta   Check a1c   [place]

## 2021-08-11 ENCOUNTER — Encounter: Payer: Self-pay | Admitting: Family Medicine

## 2021-08-15 ENCOUNTER — Ambulatory Visit (INDEPENDENT_AMBULATORY_CARE_PROVIDER_SITE_OTHER): Payer: BC Managed Care – PPO | Admitting: Family Medicine

## 2021-08-15 ENCOUNTER — Telehealth: Payer: Self-pay | Admitting: Family Medicine

## 2021-08-15 ENCOUNTER — Ambulatory Visit
Admission: RE | Admit: 2021-08-15 | Discharge: 2021-08-15 | Disposition: A | Discharge status: Home / Self Care | Payer: BC Managed Care – PPO | Source: Ambulatory Visit | Attending: Family Medicine | Admitting: Family Medicine

## 2021-08-15 VITALS — BP 120/76 | Ht 69.0 in | Wt 315.0 lb

## 2021-08-15 DIAGNOSIS — M5416 Radiculopathy, lumbar region: Secondary | ICD-10-CM | POA: Diagnosis not present

## 2021-08-15 IMAGING — CR DG LUMBAR SPINE 2-3V
3 series · 3 of 3 positions shown · non-contrast
Comparison: None.

CLINICAL DATA: High right buttock pain wrapping around to right
groin and down right thigh, occasional tingling/numbness. No known
recent injury.

EXAM:
LUMBAR SPINE - 2-3 VIEW

[t l-spine a.p. *]
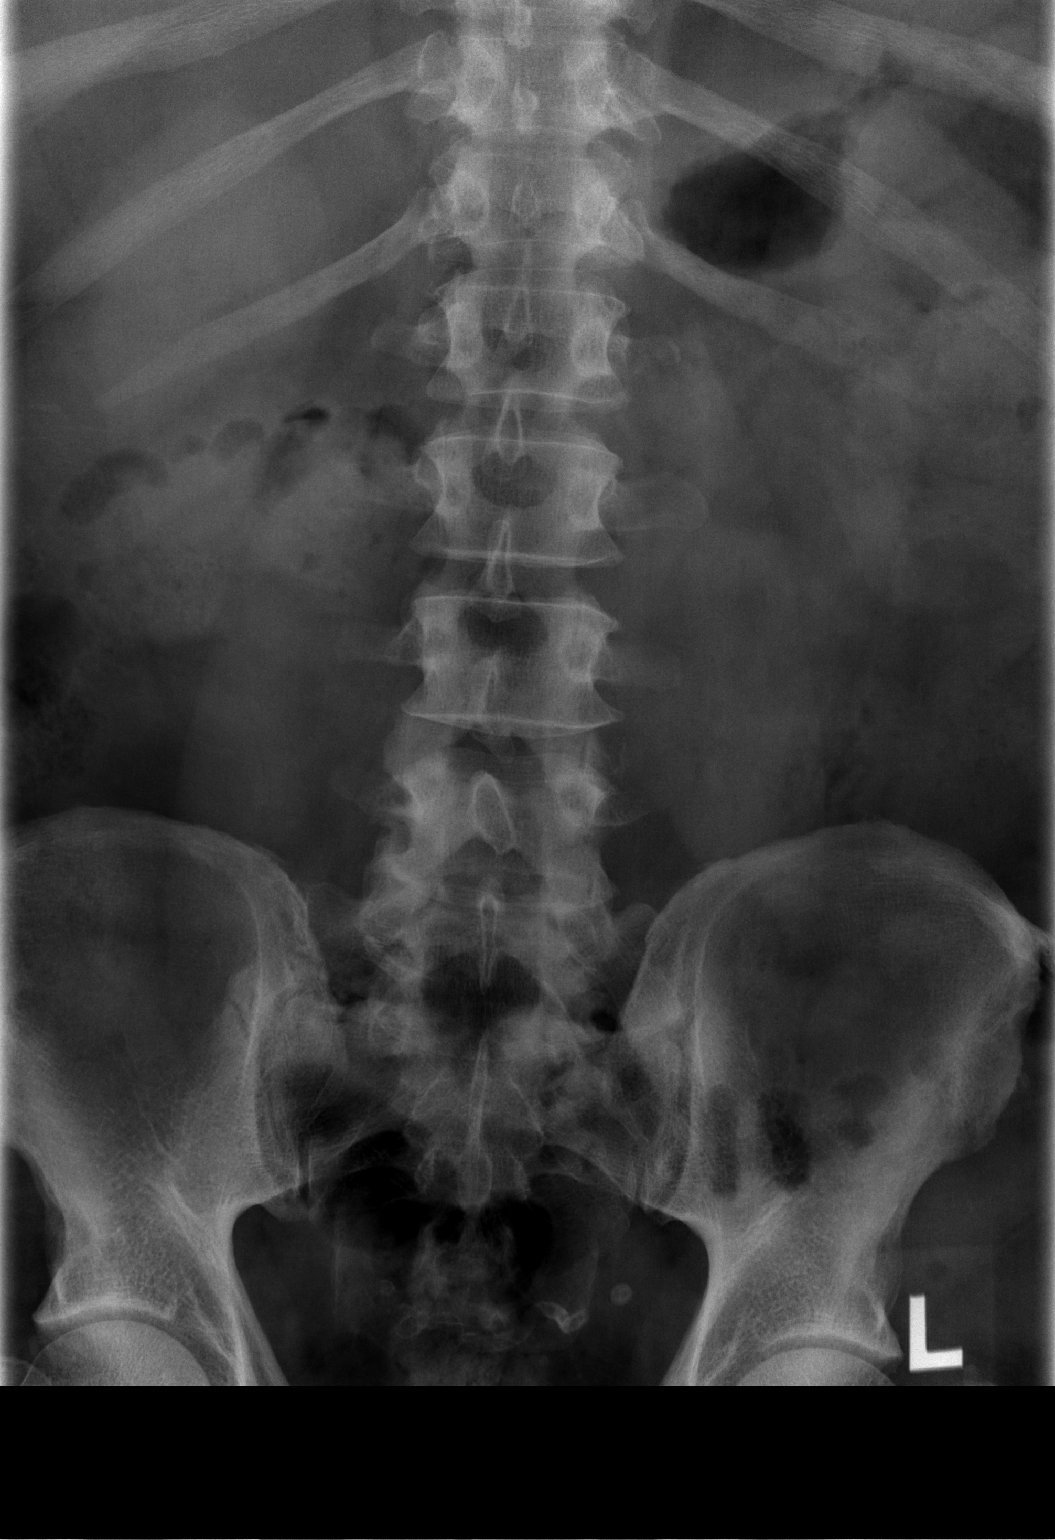

[t l-spine lat *]
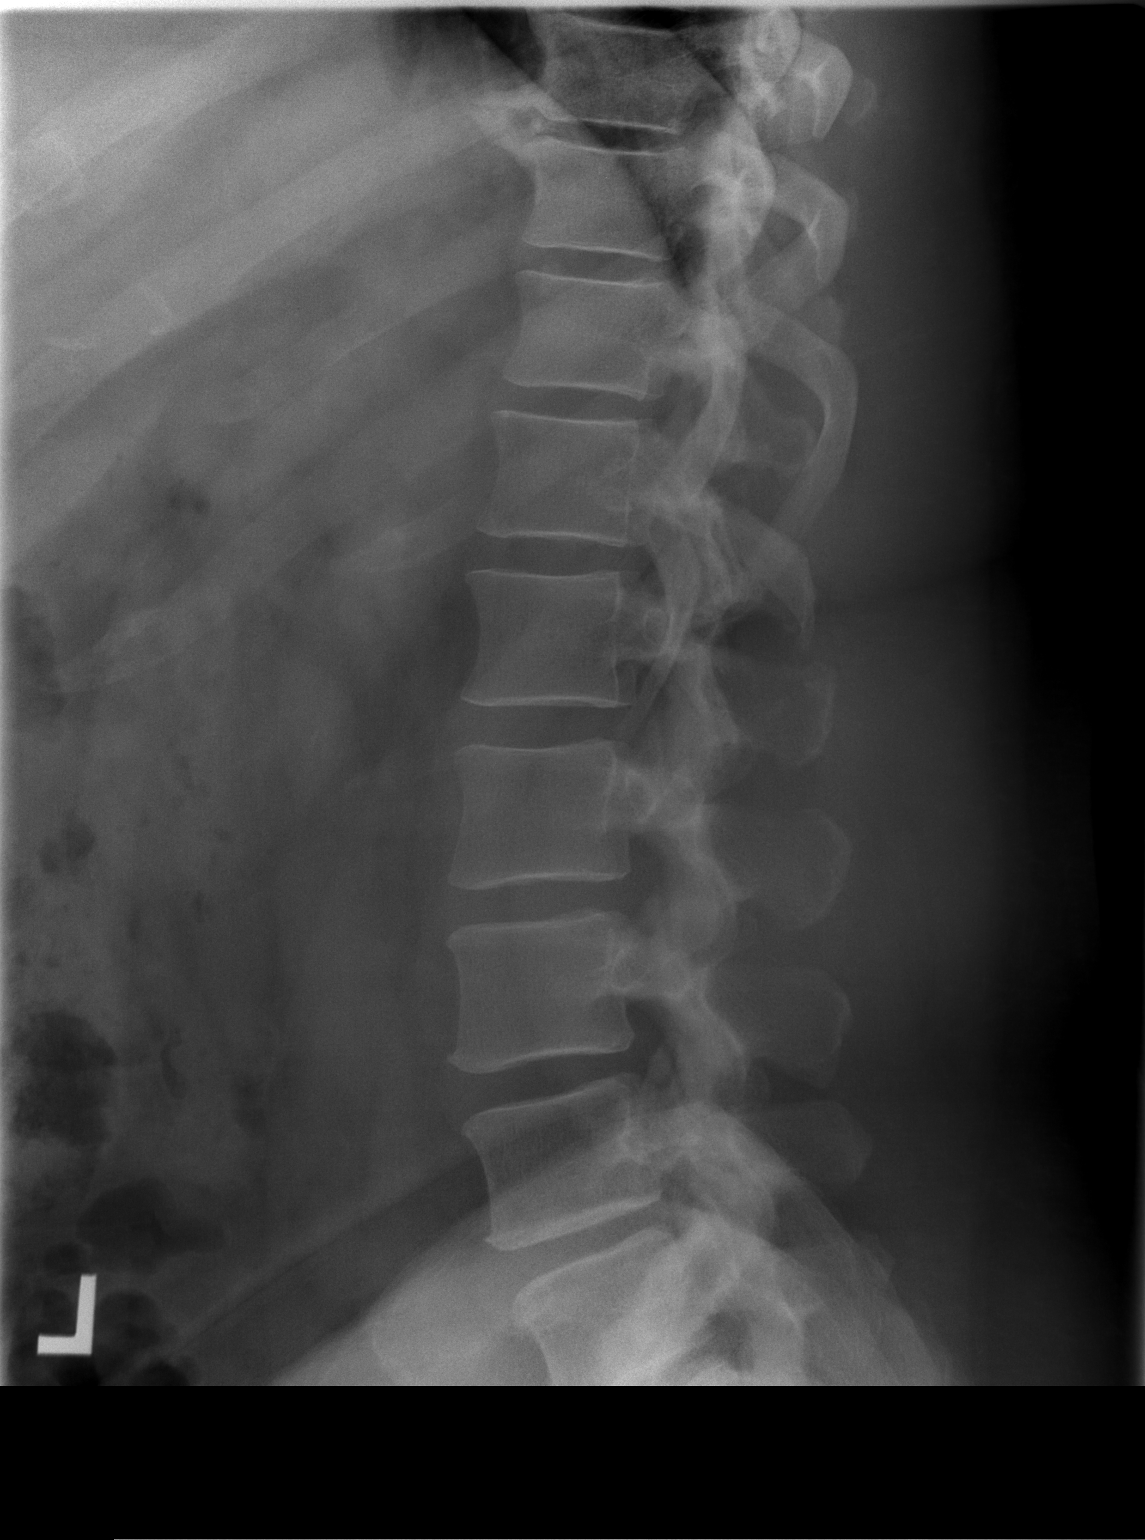

[t l-spine l5-s1 spot]
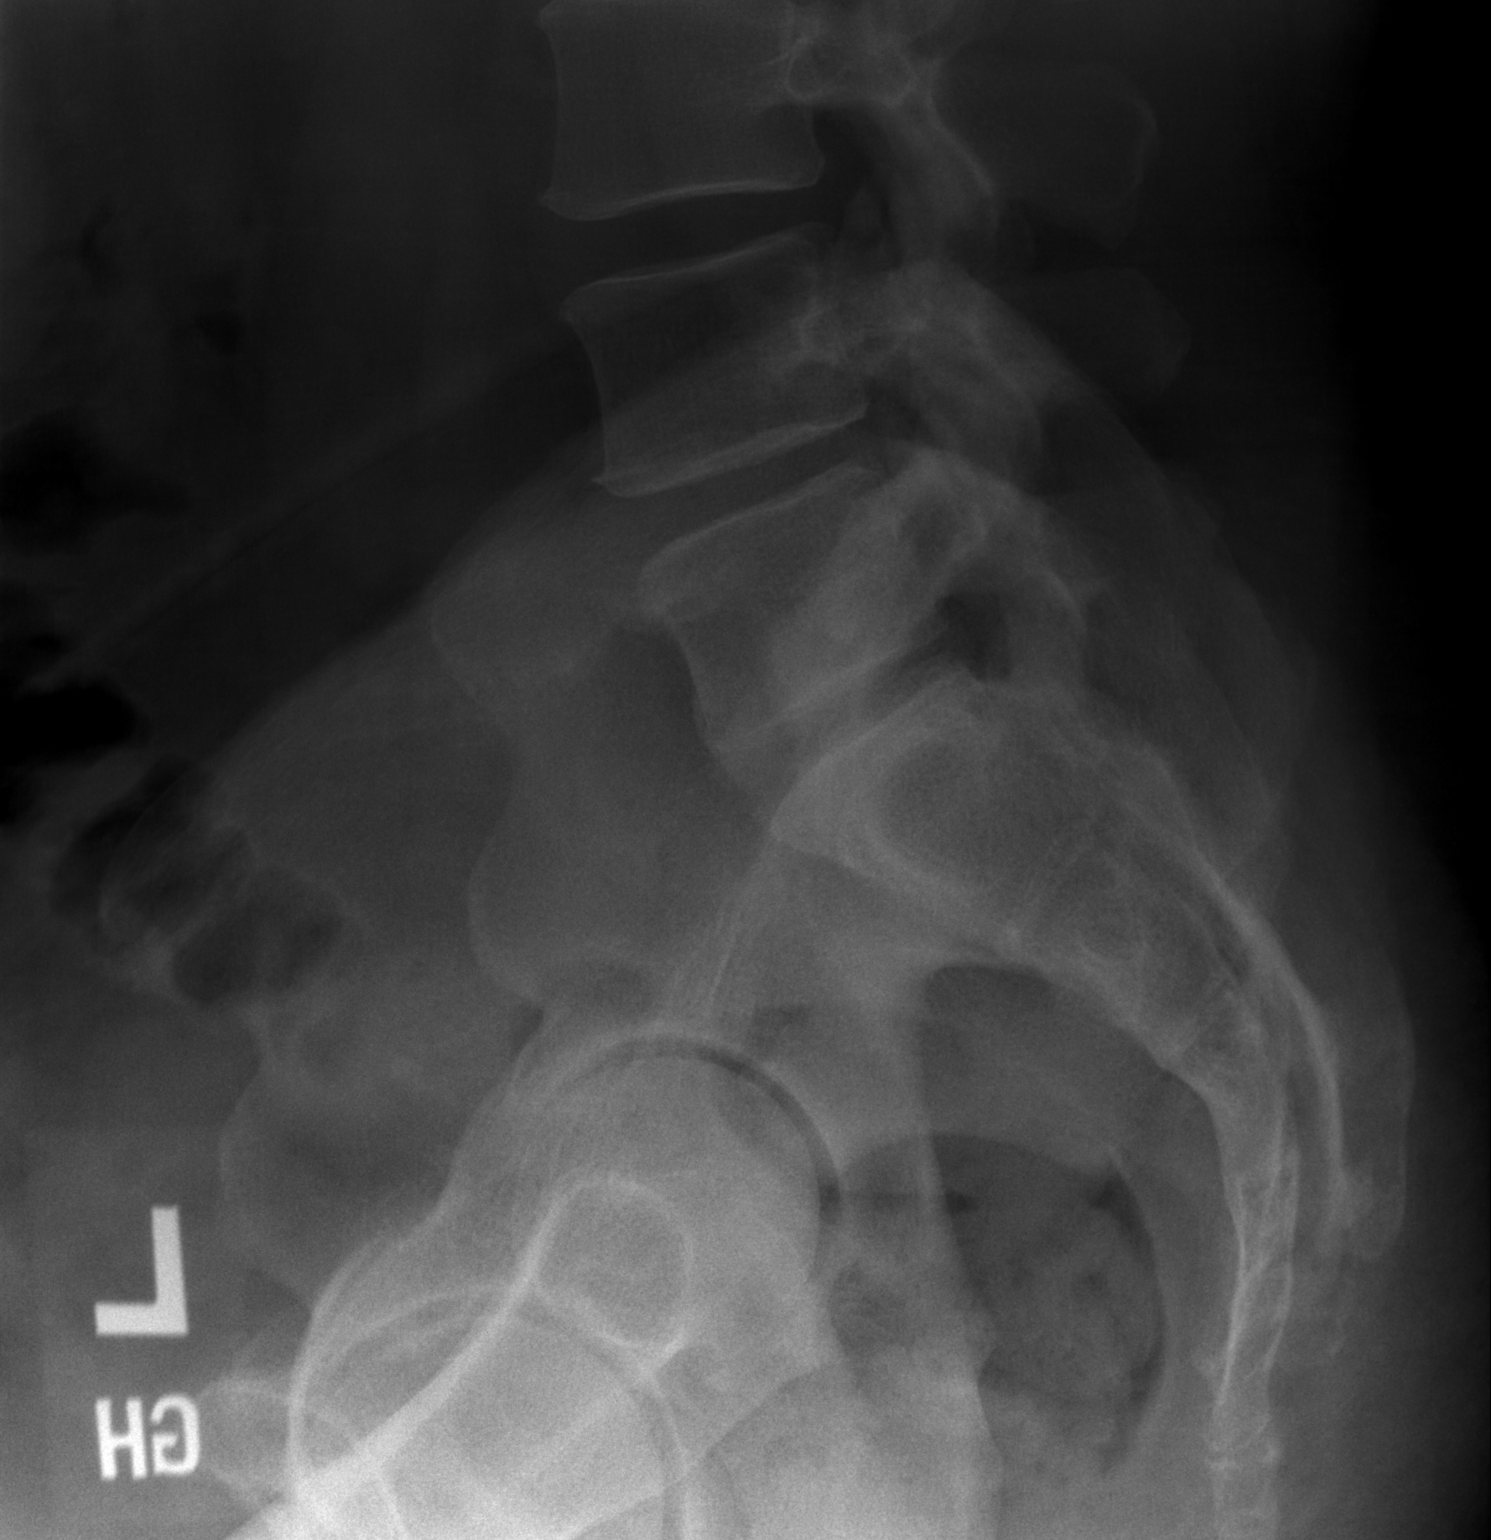

[3 of 3 positions shown; findings below may reference images not displayed]

FINDINGS: The lumbar alignment is normal. The vertebra are normal in heights
without evidence of fractures. There is mild spondylosis.

There is mild disc space loss at L3-4, L4-5 and L5-S1. Above L3
there are normal disc heights.

Mild facet joint spurring change noted lowest 2 levels and there may
be at least partial ankylosis across the L5-S1 disc space. L5 is
transitional with left hemisacralization.

The SI joints appear patent. Regional soft tissues are unremarkable.
IMPRESSION: 1. Degenerative changes without evidence of fractures.
2. Transitional L5 with left hemisacralization and possible at least
partial L5-S1 ankylosis.

## 2021-08-15 NOTE — Telephone Encounter (Signed)
Patient left at front desk for [facility].  Verified that patient section of form has been completed.  Last DOS/WCC with PCP was 08/08/20.  Placed form in red team folder to be completed by clinical staff.  Rodney Lane  ?

## 2021-08-15 NOTE — Patient Instructions (Signed)
You have lumbar radiculopathy (a pinched nerve in your low back). ?Take tylenol for baseline pain relief (1-2 extra strength tabs 3x/day) ?Increase the gabapentin to '300mg'$  twice a day. ?We can consider prednisone dose pack x 6 days but this would not fix the problem. ?Avoid aleve and ibuprofen with your blood pressure/blood pressure medicine. ?Stay as active as possible. ?Physical therapy has been shown to be helpful as well. ?Strengthening of low back muscles, abdominal musculature are key for long term pain relief. ?Get x-rays after you leave today - we will go ahead with an MRI of your lumbar spine assuming those are normal or only show arthritis. ? ?

## 2021-08-16 NOTE — Progress Notes (Signed)
PCP: Shary Key, DO ? ?Subjective:  ? ?HPI: ?Patient is a 51 y.o. male here for low back pain. ? ?Patient denies known injury. ?He's had right sided low back pain with radiation into right leg for a few months now. ?Seen by PCP on  10/29/20, 03/08/21 and 06/21/21, seen neurology as well. ?Pain was initially down to ankle, now feels this to knee. ?Associated numbness and burning. ?Improves with lying down. ?He has been taking gabapentin '300mg'$  at bedtime, tylenol, using topical biofreeze and aspercreme with only mild benefit. ?Previously diagnosed with degenerative disc disease and had epidural injections but been several years. ?No bowel/bladder dysfunction. ? ?Past Medical History:  ?Diagnosis Date  ? Allergy   ? thru the year   ? Anxiety   ? Blurry vision, bilateral 08/26/2019  ? Coronary artery calcification   ? Diabetes mellitus ([place])   ? borderline- no meds   ? Dilatation of aorta (HCC)   ? GERD (gastroesophageal reflux disease)   ? past hx- not current   ? Hepatic steatosis   ? HTN (hypertension)   ? Hypertriglyceridemia   ? Inappropriate sinus tachycardia   ? Morbid obesity ([place])   ? Sleep apnea   ? wears cpap nightly   ? ? ?Current Outpatient Medications on File Prior to Visit  ?Medication Sig Dispense Refill  ? aspirin EC 81 MG tablet Take 1 tablet (81 mg total) by mouth daily. Swallow whole. 30 tablet 11  ? cloNIDine (CATAPRES) 0.1 MG tablet Take 1 tablet (0.1 mg total) by mouth 2 (two) times daily. Please make yearly appt with Dr. Johnsie Cancel for March 2023 for future refills. Thank you 1st attempt 60 tablet 1  ? fenofibrate (TRICOR) 145 MG tablet Take 1 tablet (145 mg total) by mouth daily. 90 tablet 3  ? fluticasone (FLONASE) 50 MCG/ACT nasal spray Place 2 sprays into both nostrils daily. 16 g 6  ? gabapentin (NEURONTIN) 300 MG capsule Take 1 capsule (300 mg total) by mouth at bedtime. 90 capsule 1  ? losartan (COZAAR) 100 MG tablet TAKE 1 TABLET BY MOUTH EVERY DAY 30 tablet 11  ? metoprolol tartrate  (LOPRESSOR) 25 MG tablet TAKE 1 TABLET BY MOUTH TWICE A DAY 60 tablet 1  ? rizatriptan (MAXALT-MLT) 10 MG disintegrating tablet Take 1 tablet (10 mg total) by mouth as needed for migraine. May repeat in 2 hours if needed 9 tablet 11  ? rosuvastatin (CRESTOR) 40 MG tablet Take 1 tablet (40 mg total) by mouth daily. 90 tablet 3  ? Semaglutide,0.25 or 0.'5MG'$ /DOS, 2 MG/1.5ML SOPN Inject 0.25 mg into the skin once a week. 0.25 mg once weekly for 4 weeks then increase to 0.5 mg weekly for at least 4 weeks,max 1 mg 1.5 mL 0  ? sertraline (ZOLOFT) 25 MG tablet Take 25 mg by mouth daily.    ? sildenafil (REVATIO) 20 MG tablet Take 3-5 tablets as needed. 60 tablet 2  ? topiramate (TOPAMAX) 50 MG tablet Take 1 tablet (50 mg total) by mouth 2 (two) times daily. 60 tablet 0  ? VASCEPA 1 g capsule Take 2 capsules (2 g total) by mouth 2 (two) times daily. 360 capsule 3  ? verapamil (CALAN-SR) 240 MG CR tablet TAKE 1 TABLET BY MOUTH AT BEDTIME. 30 tablet 8  ? ?No current facility-administered medications on file prior to visit.  ? ? ?Past Surgical History:  ?Procedure Laterality Date  ? COLONOSCOPY    ? 2 past colons- 1 civilian, 1 military - normal  x 2 per pt   ? WISDOM TOOTH EXTRACTION Bilateral   ? 2000  ? ? ?No Known Allergies ? ?BP 120/76   Ht '5\' 9"'$  (1.753 m)   Wt (!) 315 lb (142.9 kg)   BMI 46.52 kg/m?  ? ?No flowsheet data found. ? ?No flowsheet data found. ? ?    ?Objective:  ?Physical Exam: ? ?Gen: NAD, comfortable in exam room ? ?Back: ?No gross deformity, scoliosis. ?TTP right paraspinal lumbar region.  No midline or bony TTP. ?FROM. ?Strength LEs 5/5 all muscle groups except right ankle dorsiflexion 4/5.   ?1+ MSRs in patellar and trace achilles tendons, equal bilaterally. ?Positive SLR on right, negative left. ?Sensation intact to light touch bilaterally. ?  ?Assessment & Plan:  ?1. Chronic low back pain with right sided sciatica - consistent with lumbar radiculopathy.  He has been seen by neurology and PCP for this.   Taking gabapentin, tylenol, using biofreeze and aspercreme.  He remotely had epidural injections which were helpful.  Being counseled by PCP for weight loss.  Will go ahead with MRI (x-rays first) and likely repeat epidural as next step.  Consider oral steroid pack.  Advised to stay away from NSAIDs with his hypertension.   ?

## 2021-08-16 NOTE — Telephone Encounter (Signed)
Reviewed form and placed in PCP's box for completion.  .Emberlynn Riggan R [place] Code, CMA  

## 2021-08-18 ENCOUNTER — Other Ambulatory Visit: Payer: Self-pay

## 2021-08-18 DIAGNOSIS — M5416 Radiculopathy, lumbar region: Secondary | ICD-10-CM

## 2021-08-20 ENCOUNTER — Other Ambulatory Visit: Payer: Self-pay | Admitting: Cardiovascular Disease

## 2021-08-25 NOTE — Telephone Encounter (Signed)
Form faxed and a copy was made for batch scanning.  ?

## 2021-08-26 ENCOUNTER — Other Ambulatory Visit: Payer: BC Managed Care – PPO

## 2021-08-27 ENCOUNTER — Ambulatory Visit
Admission: RE | Admit: 2021-08-27 | Discharge: 2021-08-27 | Disposition: A | Discharge status: Home / Self Care | Payer: BC Managed Care – PPO | Source: Ambulatory Visit | Attending: Family Medicine | Admitting: Family Medicine

## 2021-08-27 DIAGNOSIS — M5416 Radiculopathy, lumbar region: Secondary | ICD-10-CM

## 2021-08-27 IMAGING — MR MR LUMBAR SPINE W/O CM
4 of 5 series · 19 of 48 positions shown · non-contrast
Comparison: X-ray [DATE]

CLINICAL DATA: Low back pain with bilateral radiculopathy for 6
months

EXAM:
MRI LUMBAR SPINE WITHOUT CONTRAST
TECHNIQUE: Multiplanar, multisequence MR imaging of the lumbar spine was
performed. No intravenous contrast was administered.

[Series 6: T2 · sagittal · 4.0mm · 0.73mm/px · 6 of 15 slices shown (1 of 2)]
[im 1/15]
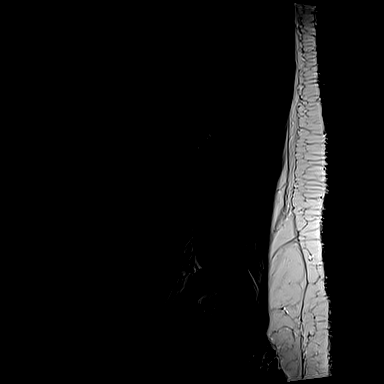
[im 3/15]
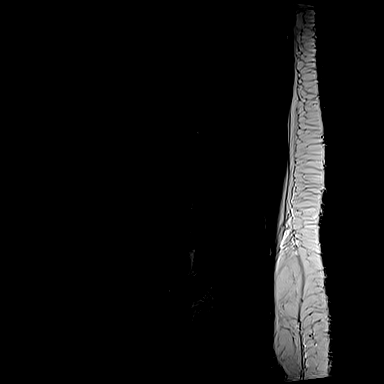
[im 6/15]
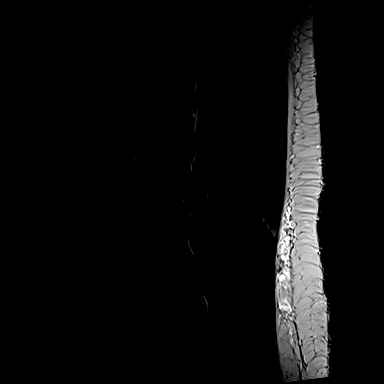
[im 9/15]
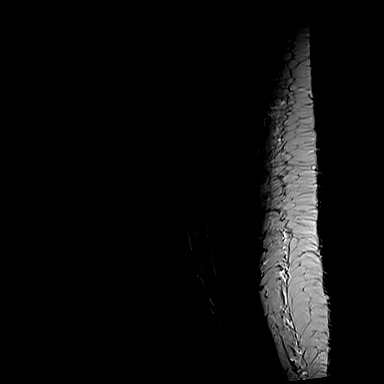
[im 12/15]
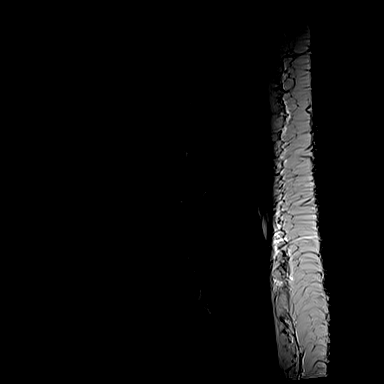
[im 15/15]
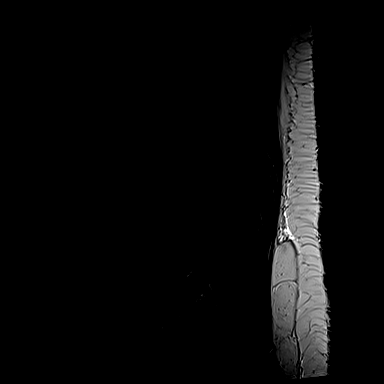

[Series 7: T1 · sagittal · 4.0mm · 0.73mm/px · 3 of 15 slices shown (1 of 2)]
[im 3/15]
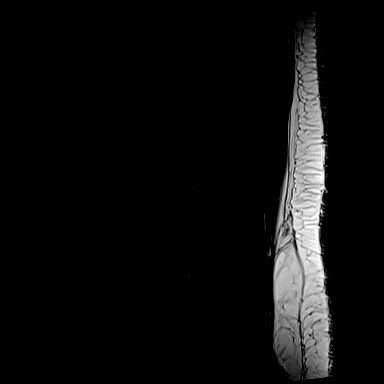
[im 9/15]
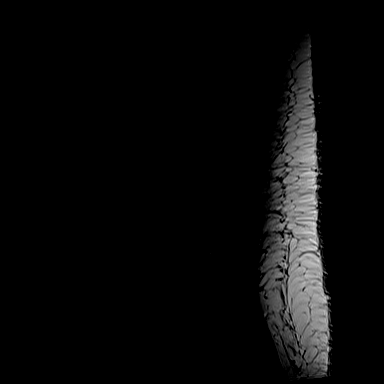
[im 15/15]
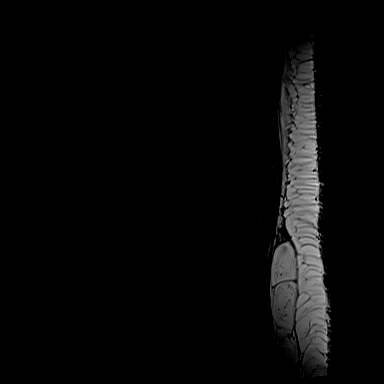

[Series 11: T1 · axial · 4.0mm · 0.28mm/px · z∈[+9,+168]mm · 3 of 40 slices shown (2 of 2)]
[im 6/40]
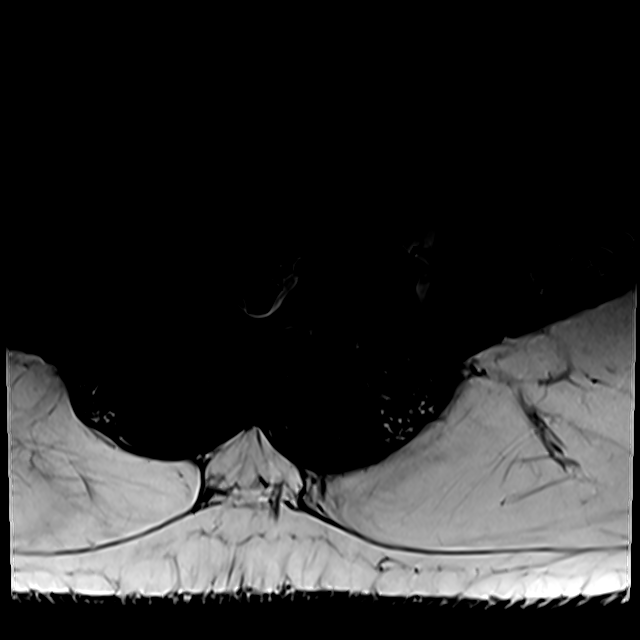
[im 20/40]
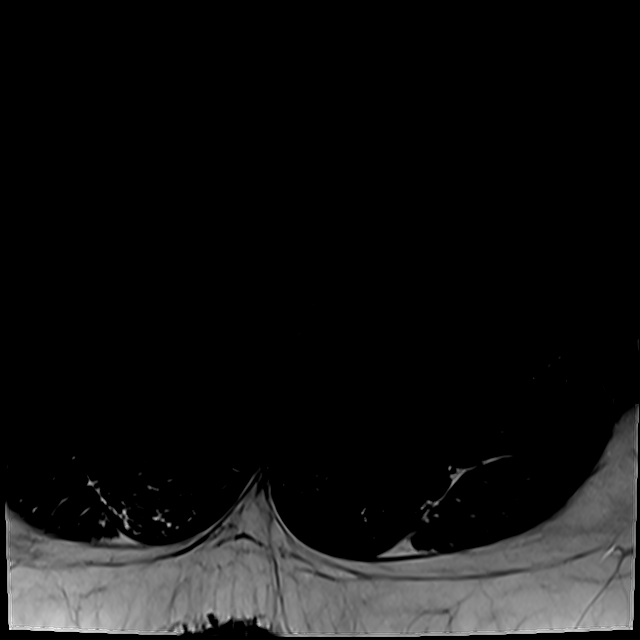
[im 34/40]
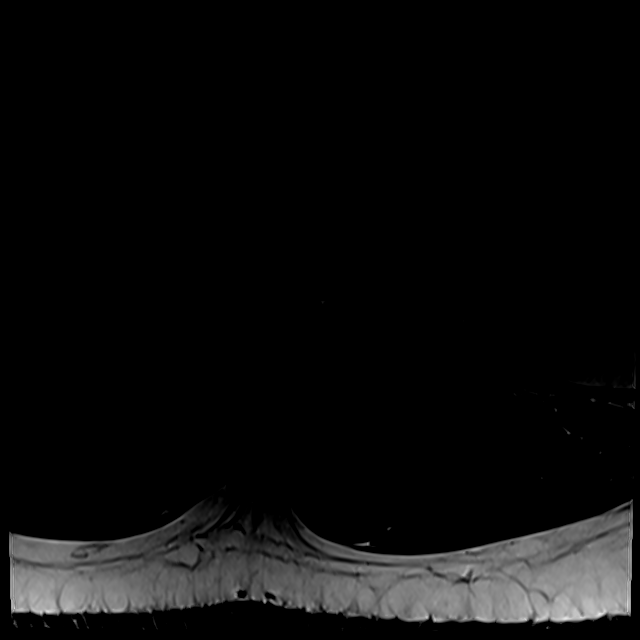

[Series 14: T2 · axial · 4.0mm · 0.28mm/px · z∈[-16,+168]mm · 7 of 40 slices shown (2 of 2)]
[im 1/40]
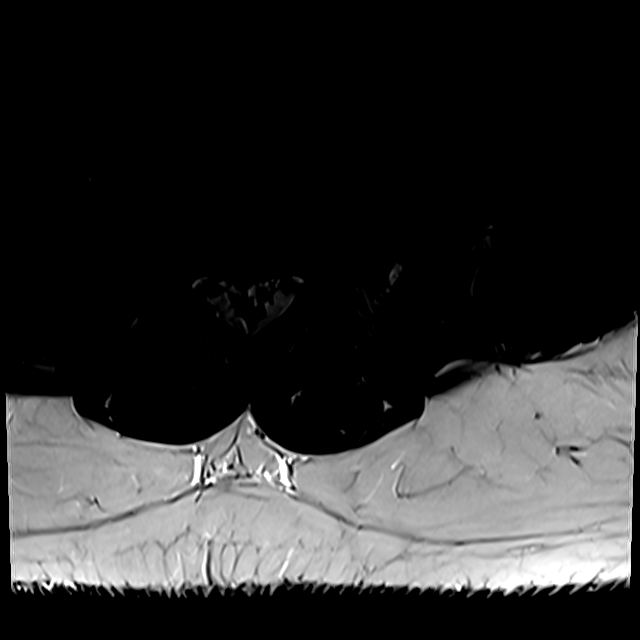
[im 6/40]
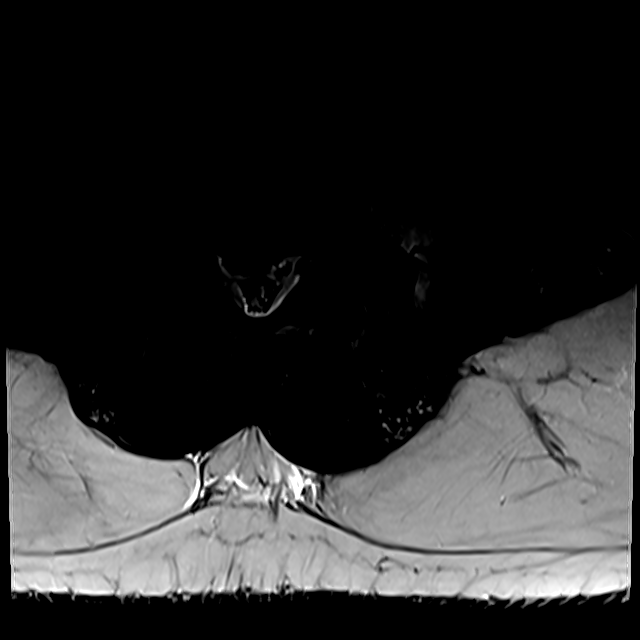
[im 12/40]
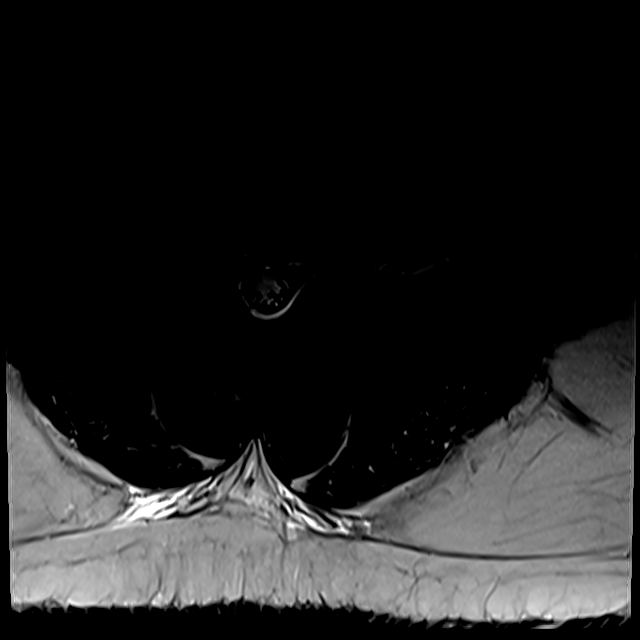
[im 17/40]
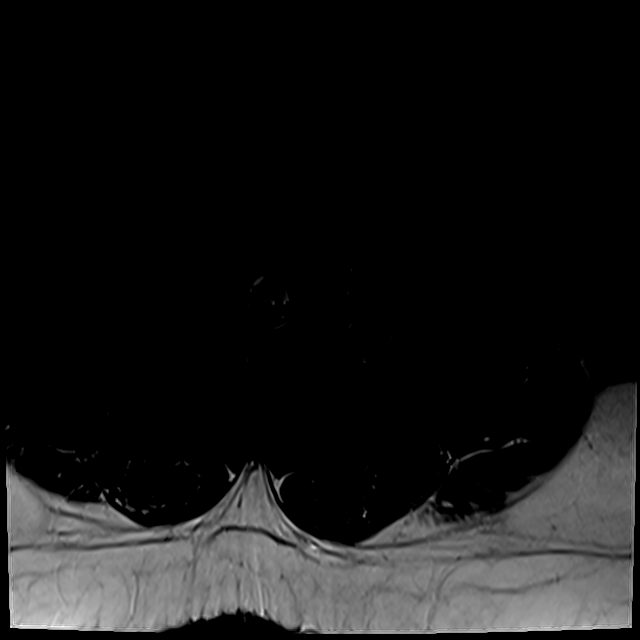
[im 20/40]
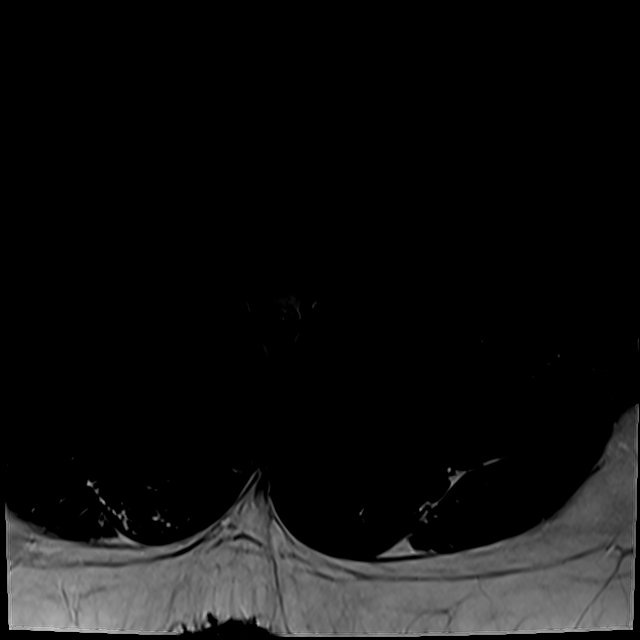
[im 23/40]
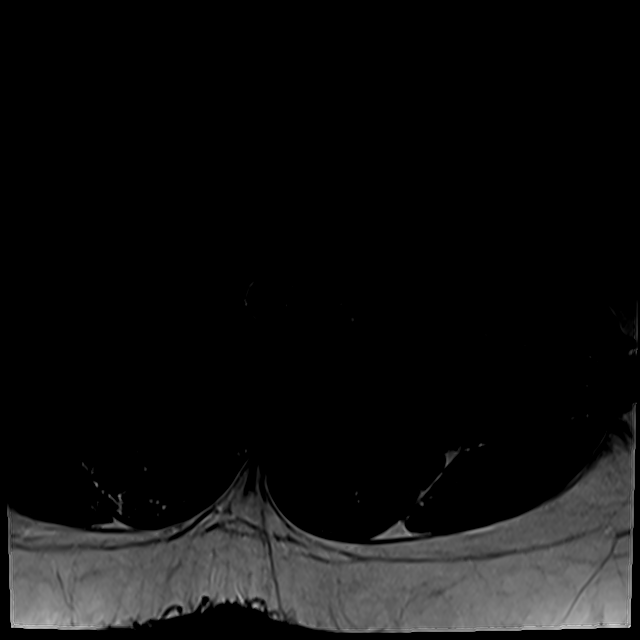
[im 34/40]
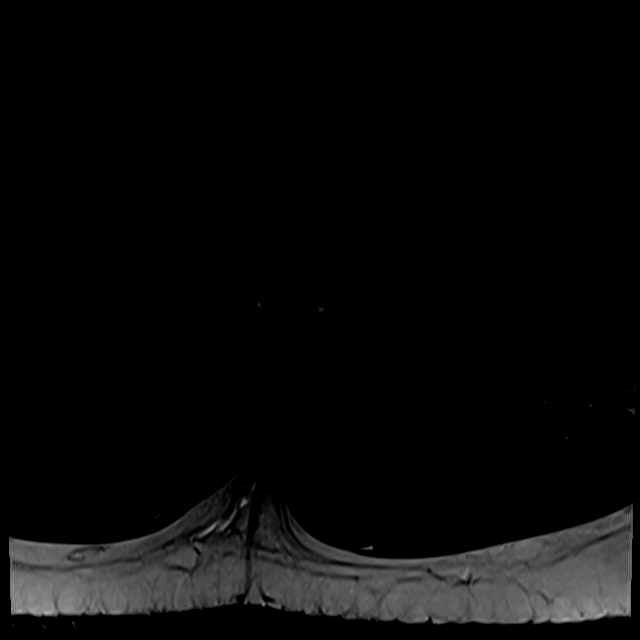

[19 of 48 positions shown; findings below may reference images not displayed]

FINDINGS: Segmentation:  Standard.

Alignment:  Physiologic.

Vertebrae: No fracture, evidence of discitis, or bone lesion. Mild
discogenic endplate marrow changes anteriorly at L4-5.

Conus medullaris and cauda equina: Conus extends to the L1-2 level.
Conus and cauda equina appear normal.

Paraspinal and other soft tissues: Negative.

Disc levels:

T12-L1 through L3-L4: Unremarkable.

L4-L5: Disc height loss with shallow left paracentral disc
protrusion. Mild facet hypertrophy with trace right facet joint
effusion. Mild mass effect upon the descending left S1 nerve root.
Borderline-mild bilateral foraminal stenosis. No canal stenosis.

L5-S1: Unremarkable.
IMPRESSION: 1. Degenerative disc disease of L4-L5 mild bilateral foraminal and
left subarticular recess stenosis.
2. Otherwise unremarkable lumbar spine MRI. No canal stenosis at any
level.

## 2021-08-31 MED ORDER — PREDNISONE 10 MG PO TABS: ORAL_TABLET | ORAL | 0 refills | Status: DC

## 2021-08-31 NOTE — Addendum Note (Signed)
Addended by: Dene Gentry on: 08/31/2021 03:19 PM ? ? Modules accepted: Orders ? ?

## 2021-09-04 ENCOUNTER — Other Ambulatory Visit: Payer: Self-pay | Admitting: Family Medicine

## 2021-09-06 ENCOUNTER — Other Ambulatory Visit: Payer: Self-pay | Admitting: Family Medicine

## 2021-09-08 ENCOUNTER — Encounter: Payer: Self-pay | Admitting: Family Medicine

## 2021-09-15 MED ORDER — PREDNISONE 10 MG PO TABS: ORAL_TABLET | ORAL | 0 refills | Status: DC

## 2021-09-20 ENCOUNTER — Other Ambulatory Visit: Payer: Self-pay | Admitting: Family Medicine

## 2021-09-20 ENCOUNTER — Other Ambulatory Visit: Payer: Self-pay | Admitting: Cardiovascular Disease

## 2021-09-21 ENCOUNTER — Other Ambulatory Visit: Payer: Self-pay | Admitting: Cardiovascular Disease

## 2021-09-22 NOTE — Patient Instructions (Signed)
Below is our plan: ? ?We will continue gabapentin '300mg'$  at bedtime. Please let me know if you are taking topiramate. Dr Arby Barrette filled topiramate '50mg'$  twice daily. Continue rizatriptan as needed and verapamil as directed by Dr Arby Barrette for blood pressure management. Please try not to take migraine medicaitons OTC on a regular basis.   ? ?Please make sure you are staying well hydrated. I recommend 50-60 ounces daily. Well balanced diet and regular exercise encouraged. Consistent sleep schedule with 6-8 hours recommended.  ? ?Please continue follow up with care team as directed.  ? ?Follow up with me in 1 year, call sooner if needed  ? ?You may receive a survey regarding today's visit. I encourage you to leave honest feed back as I do use this information to improve patient care. Thank you for seeing me today!  ? ? ?

## 2021-09-22 NOTE — Progress Notes (Signed)
? ? ?Chief Complaint  ?Patient presents with  ? Follow-up  ?  Rm 10, alone. Here for 6 month migraine f/u. Pt reports his sports medicine doctor suggested he take 2x day. 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 No significant change, has good days and bad days.   ? ? ? ?HISTORY OF PRESENT ILLNESS: ? ?09/26/21 ALL:  ?Rodney Lane returns for follow up for migraines. We discontinued topiramate and started gabapentin 300QHS at last visit 03/2021. He switched to daytime as he is working nights. He feels that gabapentin did seem to help some. He reports having two headache days a month. Both are migrainous. No tension headaches. He does feel rizatriptan has helped. He is taking OTC analgesics 4-8 times a month. He is followed regularly by Dr Arby Barrette, PCP. He continues verapamil '240mg'$  daily. BP is well managed. PCP recently refilled topiramate but he is not certain he is taking it.  ? ?03/29/2021 ALL:  ?Rodney Lane is a 51 y.o. male here today for follow up for migraines. He was started on topiramate '50mg'$  twice daily. He does not feel that this has helped. He continues to have 4-8 headache days per month with 1-2 migrainous headaches. Rizatriptan has not helped. He has experienced more shock like waves throughout body. Mostly on right side. He was advised to follow up with PCP for concerns of right arm and right thigh numbness. He has not had a chance to check in with PCP. He was given a number to call for PT but has not reached out. He has tried to loosen belt. Pain is worse when at work and standing up, better when lying down. He is using CPAP most every day. He works nights.  ? ? ?HISTORY (copied from Dr Gladstone Lighter previous note) ? ?51 year old male with headaches, arm pain, leg pain. ?  ?Patient reports occipital and right-sided headaches with dizziness, nausea, photophobia and blurred vision for past 5 years, 3-4 times a month.  Patient has family history of migraine in mother.  He has never been officially  diagnosed with migraine.  He takes over-the-counter medications with mild relief. ?  ?Patient also having intermittent shooting pains and tingling sensation in right greater than left arms.  Also has right hip pain, numbness in the right thigh, with chronic low back pain since 1993. ?  ?Patient also has borderline diabetes, hypertension, hypertriglyceridemia, sleep apnea, obesity, anxiety and depression. ? ? ?REVIEW OF SYSTEMS: Out of a complete 14 system review of symptoms, the patient complains only of the following symptoms, headaches, numbness, pain and all other reviewed systems are negative. ? ? ?ALLERGIES: ?No Known Allergies ? ? ?HOME MEDICATIONS: ?Outpatient Medications Prior to Visit  ?Medication Sig Dispense Refill  ? aspirin EC 81 MG tablet Take 1 tablet (81 mg total) by mouth daily. Swallow whole. 30 tablet 11  ? cloNIDine (CATAPRES) 0.1 MG tablet Take 1 tablet (0.1 mg total) by mouth 2 (two) times daily. Please make yearly appt with Dr. Johnsie Cancel for March 2023 for future refills. Thank you 1st attempt 60 tablet 1  ? fenofibrate (TRICOR) 145 MG tablet Take 1 tablet (145 mg total) by mouth daily. PLEAS MAKE APPOINTMENT WITH CARDIOLOGIST FOR FURTHER REFILLS 30 tablet 0  ? fluticasone (FLONASE) 50 MCG/ACT nasal spray Place 2 sprays into both nostrils daily. 16 g 6  ? losartan (COZAAR) 100 MG tablet TAKE 1 TABLET BY MOUTH EVERY DAY 30 tablet 11  ? metoprolol tartrate (LOPRESSOR) 25 MG tablet TAKE 1  TABLET BY MOUTH TWICE A DAY 60 tablet 1  ? predniSONE (DELTASONE) 10 MG tablet 6 tabs po day 1, 5 tabs po day 2, 4 tabs po day 3, 3 tabs po day 4, 2 tabs po day 5, 1 tab po day 6 21 tablet 0  ? rizatriptan (MAXALT-MLT) 10 MG disintegrating tablet Take 1 tablet (10 mg total) by mouth as needed for migraine. May repeat in 2 hours if needed 9 tablet 11  ? rosuvastatin (CRESTOR) 40 MG tablet TAKE 1 TABLET BY MOUTH EVERY DAY 15 tablet 0  ? Semaglutide,0.25 or 0.'5MG'$ /DOS, 2 MG/1.5ML SOPN Inject 0.25 mg into the skin once  a week. 0.25 mg once weekly for 4 weeks then increase to 0.5 mg weekly for at least 4 weeks,max 1 mg 1.5 mL 0  ? sertraline (ZOLOFT) 25 MG tablet Take 25 mg by mouth daily.    ? sildenafil (REVATIO) 20 MG tablet Take 3-5 tablets as needed. 60 tablet 2  ? topiramate (TOPAMAX) 50 MG tablet TAKE 1 TABLET BY MOUTH TWICE A DAY 60 tablet 0  ? VASCEPA 1 g capsule Take 2 capsules (2 g total) by mouth 2 (two) times daily. 360 capsule 3  ? verapamil (CALAN-SR) 240 MG CR tablet TAKE 1 TABLET BY MOUTH AT BEDTIME. 30 tablet 8  ? gabapentin (NEURONTIN) 300 MG capsule Take 1 capsule (300 mg total) by mouth at bedtime. 90 capsule 1  ? ?No facility-administered medications prior to visit.  ? ? ? ?PAST MEDICAL HISTORY: ?Past Medical History:  ?Diagnosis Date  ? Allergy   ? thru the year   ? Anxiety   ? Blurry vision, bilateral 08/26/2019  ? Coronary artery calcification   ? Diabetes mellitus ([place])   ? borderline- no meds   ? Dilatation of aorta (HCC)   ? GERD (gastroesophageal reflux disease)   ? past hx- not current   ? Hepatic steatosis   ? HTN (hypertension)   ? Hypertriglyceridemia   ? Inappropriate sinus tachycardia   ? Morbid obesity ([place])   ? Sleep apnea   ? wears cpap nightly   ? ? ? ?PAST SURGICAL HISTORY: ?Past Surgical History:  ?Procedure Laterality Date  ? COLONOSCOPY    ? 2 past colons- 1 civilian, 1 military - normal x 2 per pt   ? WISDOM TOOTH EXTRACTION Bilateral   ? 2000  ? ? ? ?FAMILY HISTORY: ?Family History  ?Problem Relation Age of Onset  ? Cancer Mother   ? Cancer Father   ? Diabetes Father   ? Colon cancer Father   ?     dx'd in his 69's , died in 58's   ? Heart disease Maternal Grandfather   ?     diagnosed 52s  ? Heart disease Paternal Grandfather   ?     diagnosed 71s  ? Colon polyps Neg Hx   ? Esophageal cancer Neg Hx   ? Rectal cancer Neg Hx   ? Stomach cancer Neg Hx   ? ? ? ?SOCIAL HISTORY: ?Social History  ? ?Socioeconomic History  ? Marital status: Single  ?  Spouse name: Not on file  ? Number of  children: 0  ? Years of education: Not on file  ? Highest education level: Associate degree: academic program  ?Occupational History  ?  Comment: [employer]  ?Tobacco Use  ? Smoking status: Never  ? Smokeless tobacco: Never  ?Vaping Use  ? Vaping Use: Never used  ?Substance and Sexual Activity  ?  Alcohol use: Never  ? Drug use: Never  ? Sexual activity: Not on file  ?Other Topics Concern  ? Not on file  ?Social History Narrative  ? Not on file  ? ?Social Determinants of Health  ? ?Financial Resource Strain: Not on file  ?Food Insecurity: Not on file  ?Transportation Needs: Not on file  ?Physical Activity: Not on file  ?Stress: Not on file  ?Social Connections: Not on file  ?Intimate Partner Violence: Not on file  ? ? ? ?PHYSICAL EXAM ? ?Vitals:  ? 09/26/21 0758  ?BP: 132/87  ?Pulse: 76  ?Weight: (!) 312 lb 8 oz (141.7 kg)  ?Height: '5\' 9"'$  (1.753 m)  ? ? ?Body mass index is 46.15 kg/m?. ? ?Generalized: Well developed, in no acute distress ? ?Cardiology: normal rate and rhythm, no murmur auscultated  ?Respiratory: clear to auscultation bilaterally   ? ?Neurological examination  ?Mentation: Alert oriented to time, place, history taking. Follows all commands speech and language fluent ?Cranial nerve II-XII: Pupils were equal round reactive to light. Extraocular movements were full, visual field were full on confrontational test. Facial sensation and strength were normal. Head turning and shoulder shrug  were normal and symmetric. ?Motor: The motor testing reveals 5 over 5 strength of all 4 extremities. Good symmetric motor tone is noted throughout.  ?Sensory: Sensory testing is intact to soft touch on all 4 extremities. No evidence of extinction is noted.  ?Gait and station: Gait is normal.  ? ? ?DIAGNOSTIC DATA (LABS, IMAGING, TESTING) ?- I reviewed patient records, labs, notes, testing and imaging myself where available. ? ?Lab Results  ?Component Value Date  ? WBC 7.7 07/15/2019  ? HGB 14.1 07/15/2019  ? HCT  43.4 07/15/2019  ? MCV 93.7 07/15/2019  ? PLT 233 07/15/2019  ? ?   ?Component Value Date/Time  ? NA 139 02/28/2021 1520  ? NA 142 03/25/2020 1000  ? K 4.2 02/28/2021 1520  ? CL 105 02/28/2021 1520  ? CO2 27

## 2021-09-26 ENCOUNTER — Encounter: Payer: Self-pay | Admitting: Family Medicine

## 2021-09-26 ENCOUNTER — Ambulatory Visit: Payer: BC Managed Care – PPO | Admitting: Family Medicine

## 2021-09-26 ENCOUNTER — Encounter: Payer: Self-pay | Admitting: *Deleted

## 2021-09-26 VITALS — BP 132/87 | HR 76 | Ht 69.0 in | Wt 312.5 lb

## 2021-09-26 DIAGNOSIS — G43109 Migraine with aura, not intractable, without status migrainosus: Secondary | ICD-10-CM | POA: Diagnosis not present

## 2021-09-26 MED ORDER — GABAPENTIN 300 MG PO CAPS: 300.0000 mg | ORAL_CAPSULE | Freq: Every day | ORAL | 3 refills | Status: DC

## 2021-09-29 ENCOUNTER — Encounter: Payer: Self-pay | Admitting: Family Medicine

## 2021-10-04 ENCOUNTER — Other Ambulatory Visit: Payer: Self-pay | Admitting: Family Medicine

## 2021-10-08 ENCOUNTER — Other Ambulatory Visit: Payer: Self-pay | Admitting: Cardiovascular Disease

## 2021-10-15 ENCOUNTER — Other Ambulatory Visit: Payer: Self-pay | Admitting: Cardiovascular Disease

## 2021-10-28 ENCOUNTER — Ambulatory Visit: Payer: BC Managed Care – PPO | Admitting: Family Medicine

## 2021-11-01 ENCOUNTER — Encounter: Payer: Self-pay | Admitting: *Deleted

## 2021-11-02 ENCOUNTER — Other Ambulatory Visit: Payer: Self-pay | Admitting: Family Medicine

## 2021-11-02 ENCOUNTER — Ambulatory Visit: Payer: BC Managed Care – PPO | Admitting: Family Medicine

## 2021-11-02 ENCOUNTER — Other Ambulatory Visit: Payer: Self-pay

## 2021-11-02 ENCOUNTER — Encounter: Payer: Self-pay | Admitting: Family Medicine

## 2021-11-02 VITALS — BP 135/90 | HR 94 | Wt 317.2 lb

## 2021-11-02 DIAGNOSIS — E119 Type 2 diabetes mellitus without complications: Secondary | ICD-10-CM

## 2021-11-02 LAB — POCT GLYCOSYLATED HEMOGLOBIN (HGB A1C): HbA1c, POC (controlled diabetic range): 7.4 % — AB (ref 0.0–7.0)

## 2021-11-02 MED ORDER — WEGOVY 0.25 MG/0.5ML ~~LOC~~ SOAJ: 0.2500 mg | SUBCUTANEOUS | 0 refills | Status: AC

## 2021-11-02 NOTE — Patient Instructions (Addendum)
It was great seeing you today!  Im glad your back is doing a little better.  I am not going to change any of the medication, and I sports medicine stated if you continue to have pain I recommend going back to them for a possible injection if needed.  I have prescribed Wegovy which is the same medication as the Ozempic but hopefully you're insurance will approve this.  I also recommend calling the [facility] to get on their wait list.  The number is [phone].  I will fill your form out and fax it and I will send you a copy via MyChart as well.  I have also [facility] [place] which she will walk in and get done and hopefully it is more affordable than what it was before.  I have booked your physical appointment, so I will see you July 3 and come in fasting so we can check your triglycerides.  If you need to be seen earlier than that for any new issues we're happy to fit you in, just give [place] a call!  Feel free to call with any questions or concerns at any time, at [phone].   Take care,  Dr. Shary Key [facility]

## 2021-11-02 NOTE — Progress Notes (Signed)
    SUBJECTIVE:   CHIEF COMPLAINT / HPI:   Rodney Lane presents to discuss disability paperwork he needs signed. This paperwork has been completed previously, but employer needs it done about twice annually.   Additionally he reports ongoing back pain. Was referred to sports medicine clinic after our last visit and was prescribed 2 courses of steroids at sports med which helped and was advised to follow up for epideral if still with continued back pain. States his thigh gets numb but pain doesn't shoot down right leg like before.   Obesity- BMI 46.84. Weight loss is important for him and he knows it will also help with his back pain. He states the Ozempic was denied by his insurance. Topiramate and Riatriptan restarted at last visit helping with headaches. Was seen by neurology since our visit and they recommended continuing with this therapy.    PERTINENT  PMH / PSH: Reviewed   OBJECTIVE:   BP 135/90   Pulse 94   Wt (!) 317 lb 3.2 oz (143.9 kg)   SpO2 100%   BMI 46.84 kg/m    Physical exam General: well appearing, NAD Cardiovascular: RRR, no murmurs Lungs: CTAB. Normal WOB Abdomen: soft, non-distended, non-tender Skin: warm, dry. No edema  ASSESSMENT/PLAN:   Obesity Diabetes mellitus without complication (HCC) J4H 7.4 today. BMI 46.84. Weight loss will be important for him as it will also help with his back pain. Discussed exercise and healthy food choices. Exercise limited by his back pain. Tried prescribing Ozempic previously but was not covered, unclear why with diagnosis of diabetes. Will try Wegovy. Also recommended calling [facility] for further weight loss support. Patient states after we try these things he will be open to discussing bariatric surgery  Health maintenance Sent Shingrix rx to pharmacy Patient to schedule eye exam   Will complete and fax disability paperwork  Scheduled appointment for physical exam 7/3. Will also check on Wegovy at that  time.   [place]

## 2021-11-06 MED ORDER — ZOSTER VAC RECOMB ADJUVANTED 50 MCG/0.5ML IM SUSR: 0.5000 mL | Freq: Once | INTRAMUSCULAR | 0 refills | Status: AC

## 2021-11-06 NOTE — Assessment & Plan Note (Signed)
A1c 7.4 today. BMI 46.84. Weight loss will be important for him as it will also help with his back pain. Discussed exercise and healthy food choices. Exercise limited by his back pain. Tried prescribing Ozempic previously but was not covered, unclear why with diagnosis of diabetes. Will try Wegovy. Also recommended calling [facility] for further weight loss support. Patient states after we try these things he will be open to discussing bariatric surgery

## 2021-11-07 ENCOUNTER — Encounter: Payer: Self-pay | Admitting: Family Medicine

## 2021-11-14 ENCOUNTER — Encounter: Payer: Self-pay | Admitting: Family Medicine

## 2021-11-15 ENCOUNTER — Other Ambulatory Visit: Payer: Self-pay | Admitting: Cardiovascular Disease

## 2021-11-21 ENCOUNTER — Other Ambulatory Visit: Payer: Self-pay | Admitting: Family Medicine

## 2021-11-28 ENCOUNTER — Encounter: Payer: Self-pay | Admitting: Family Medicine

## 2021-11-28 ENCOUNTER — Other Ambulatory Visit (HOSPITAL_COMMUNITY): Payer: Self-pay

## 2021-11-28 ENCOUNTER — Telehealth: Payer: Self-pay

## 2021-11-28 ENCOUNTER — Ambulatory Visit (INDEPENDENT_AMBULATORY_CARE_PROVIDER_SITE_OTHER): Payer: BC Managed Care – PPO | Admitting: Family Medicine

## 2021-11-28 VITALS — BP 134/90 | HR 88 | Ht 69.0 in | Wt 314.0 lb

## 2021-11-28 DIAGNOSIS — I10 Essential (primary) hypertension: Secondary | ICD-10-CM | POA: Diagnosis not present

## 2021-11-28 DIAGNOSIS — E781 Pure hyperglyceridemia: Secondary | ICD-10-CM

## 2021-11-28 DIAGNOSIS — Z0000 Encounter for general adult medical examination without abnormal findings: Secondary | ICD-10-CM | POA: Diagnosis not present

## 2021-11-28 DIAGNOSIS — E119 Type 2 diabetes mellitus without complications: Secondary | ICD-10-CM | POA: Diagnosis not present

## 2021-11-28 MED ORDER — MOUNJARO 2.5 MG/0.5ML ~~LOC~~ SOAJ: 2.5000 mg | SUBCUTANEOUS | 0 refills | Status: DC

## 2021-11-28 MED ORDER — ZOSTER VAC RECOMB ADJUVANTED 50 MCG/0.5ML IM SUSR: 0.5000 mL | Freq: Once | INTRAMUSCULAR | 0 refills | Status: AC

## 2021-11-28 NOTE — Progress Notes (Signed)
    SUBJECTIVE:   Chief compliant/HPI: annual examination  Rodney Lane is a 51 y.o. who presents today for an annual exam.   Fasting for blood work. Would like triglycerides checked. He also states his company still doesn't have FMLA forms despite [place] faxing them, will pick them up from office today  Doing well, has ongoing back pain. Also follows with sports medicine for it.  Denies tobacco use, alcohol use, recreational drug use   Not sexually active, declines STI testing   Hasnt called [facility] yet. Will do so soon.   Will schedule for eye exam, states it has been years since checked.   OBJECTIVE:   BP 134/90   Pulse 88   Ht '5\' 9"'$  (1.753 m)   Wt (!) 314 lb (142.4 kg)   SpO2 98%   BMI 46.37 kg/m    General: alert, NAD CV: RRR no murmurs Resp: CTAB normal WOB GI: soft, obese, non distended Derm: warm, dry. No LE edema Extremities: feet with dry skin bilaterally. No wounds or ulcers. Pedal pulses 2+ bilaterally   ASSESSMENT/PLAN:   No problem-specific Assessment & Plan notes found for this encounter.   Annual Examination  See AVS for age appropriate recommendations.  PHQ score 4, reviewed and discussed.  Blood pressure value is slightly above goal, discussed. Patient did not take medications because he is fasting for his blood work and takes his meds with food.   Considered the following screening exams based upon USPSTF recommendations: Diabetes screening:  recently ordered. Will return in 2 months for nextA1c . Given diabetes diagnosis, will switch from [facility] to [facility]. Will call to schedule eye exam.  Screening for elevated cholesterol: ordered HIV testing: discussed Not ordered  Syphilis if at high risk: discussed Not ordered  Reviewed risk factors for latent tuberculosis and not indicated Colorectal cancer screening: up to date on screening for CRC. Lung cancer screening: not needed PSA discussed and after engaging in discussion of  possible risks, benefits and complications of screening patient elected to check PSA.   Follow up in 1 year or sooner if indicated.    [place]

## 2021-11-28 NOTE — Telephone Encounter (Signed)
A Prior Authorization was initiated for this patients MOUNJARO through CoverMyMeds.   Key: [id number]

## 2021-11-28 NOTE — Patient Instructions (Signed)
It was great seeing you today!  Today we did your physical and we are also checking blood work. I will call you if anything is abnormal and will send a mychart message if normal.   Please check-out at the front desk before leaving the clinic. I'd like to see you back in 2 months for your next diabetes check up, but if you need to be seen earlier than that for any new issues we're happy to fit you in, just give [place] a call!  Feel free to call with any questions or concerns at any time, at [phone].   Take care,  Dr. Shary Key [facility] Center

## 2021-11-29 LAB — BASIC METABOLIC PANEL
BUN/Creatinine Ratio: 17 (ref 9–20)
BUN: 21 mg/dL (ref 6–24)
CO2: 20 mmol/L (ref 20–29)
Calcium: 9.8 mg/dL (ref 8.7–10.2)
Chloride: 107 mmol/L — ABNORMAL HIGH (ref 96–106)
Creatinine, Ser: 1.23 mg/dL (ref 0.76–1.27)
Glucose: 123 mg/dL — ABNORMAL HIGH (ref 70–99)
Potassium: 4.2 mmol/L (ref 3.5–5.2)
Sodium: 142 mmol/L (ref 134–144)
eGFR: 71 mL/min/{1.73_m2} (ref >59)

## 2021-11-29 LAB — LIPID PANEL
Chol/HDL Ratio: 4.4 ratio (ref 0.0–5.0)
Cholesterol, Total: 140 mg/dL (ref 100–199)
HDL: 32 mg/dL — ABNORMAL LOW (ref >39)
LDL Chol Calc (NIH): 70 mg/dL (ref 0–99)
Triglycerides: 231 mg/dL — ABNORMAL HIGH (ref 0–149)
VLDL Cholesterol Cal: 38 mg/dL (ref 5–40)

## 2021-11-29 LAB — PSA: Prostate Specific Ag, Serum: 1.9 ng/mL (ref 0.0–4.0)

## 2021-11-30 NOTE — Telephone Encounter (Signed)
Rec'd [facility], PA not required for medication MOUNJARO.

## 2021-12-02 ENCOUNTER — Other Ambulatory Visit: Payer: Self-pay | Admitting: Family Medicine

## 2021-12-03 ENCOUNTER — Other Ambulatory Visit: Payer: Self-pay | Admitting: Cardiovascular Disease

## 2021-12-07 ENCOUNTER — Other Ambulatory Visit: Payer: Self-pay | Admitting: Family Medicine

## 2021-12-07 ENCOUNTER — Telehealth: Payer: Self-pay | Admitting: Family Medicine

## 2021-12-07 MED ORDER — FENOFIBRATE 145 MG PO TABS: 145.0000 mg | ORAL_TABLET | Freq: Every day | ORAL | 0 refills | Status: DC

## 2021-12-07 NOTE — Telephone Encounter (Signed)
Called patient in response to [employer]. HE states FMLA paperwork has end date of September but they need it until May 2024. I told him to let me know if I need to adjust it and I will. Also, discussed elevated triglycerides with him. He states he has not been taking the fibrate. Refilled Rx of fenofibrate '145mg'$ . Additionally he states he is not feeling well. All morning has been nauseas, threw up this morning. Has felt dizzy this morning. Blood pressure 107/75 currently without dizziness. Advised to stay well hydrated. Discussed strict ED precautions. Patient agreeable with plan.

## 2021-12-13 ENCOUNTER — Other Ambulatory Visit (HOSPITAL_COMMUNITY): Payer: Self-pay

## 2021-12-14 ENCOUNTER — Other Ambulatory Visit: Payer: Self-pay | Admitting: Family Medicine

## 2021-12-14 DIAGNOSIS — R000 Tachycardia, unspecified: Secondary | ICD-10-CM

## 2021-12-19 ENCOUNTER — Other Ambulatory Visit: Payer: Self-pay | Admitting: Family Medicine

## 2021-12-22 ENCOUNTER — Other Ambulatory Visit (HOSPITAL_COMMUNITY): Payer: Self-pay

## 2021-12-22 NOTE — Telephone Encounter (Signed)
Still receiving PA requests from pharmacy.  Called to speak with pharmacy about pt's insurance. Pt has 3 insurances, 2 of which are saying they are primary. [place] is covering the medication primarily with a $1000+ copay. [employer] primarily has $500 copay.  Spoke with pharmacy and asked them to reach out to patient about his pharmacy coverage since its unclear for both of [place]. I will submit a new PA if needed.

## 2021-12-23 ENCOUNTER — Other Ambulatory Visit: Payer: Self-pay | Admitting: Cardiovascular Disease

## 2021-12-30 ENCOUNTER — Other Ambulatory Visit: Payer: Self-pay | Admitting: Family Medicine

## 2022-01-07 ENCOUNTER — Other Ambulatory Visit: Payer: Self-pay | Admitting: Cardiovascular Disease

## 2022-01-07 ENCOUNTER — Other Ambulatory Visit: Payer: Self-pay | Admitting: Family Medicine

## 2022-01-09 ENCOUNTER — Other Ambulatory Visit: Payer: Self-pay

## 2022-01-20 ENCOUNTER — Encounter: Payer: Self-pay | Admitting: Family Medicine

## 2022-01-20 ENCOUNTER — Other Ambulatory Visit: Payer: Self-pay | Admitting: Family Medicine

## 2022-01-20 ENCOUNTER — Ambulatory Visit (INDEPENDENT_AMBULATORY_CARE_PROVIDER_SITE_OTHER): Payer: BC Managed Care – PPO | Admitting: Family Medicine

## 2022-01-20 VITALS — BP 132/84 | HR 94 | Wt 312.4 lb

## 2022-01-20 DIAGNOSIS — E119 Type 2 diabetes mellitus without complications: Secondary | ICD-10-CM | POA: Diagnosis not present

## 2022-01-20 DIAGNOSIS — I10 Essential (primary) hypertension: Secondary | ICD-10-CM

## 2022-01-20 NOTE — Progress Notes (Unsigned)
    SUBJECTIVE:   CHIEF COMPLAINT / HPI:   Patient presents for completion of FMLA paperwork.   DM2 Last a1c on 6/7 7.4. Working on approval for GLP-1 agonist for diabetes and weight loss. Insurance seems to cover it but has copay.    HTN BP 116/70s- 131/89 at home.  Varapamil Losartan  Metoprolol  Clonidine   Patient also needs FMLA form completed  PERTINENT  PMH / PSH: reviewed   OBJECTIVE:   BP 132/84   Pulse 94   Wt (!) 312 lb 6.4 oz (141.7 kg)   SpO2 98%   BMI 46.13 kg/m    Physical exam General: well appearing, NAD Cardiovascular: RRR, no murmurs Lungs: CTAB. Normal WOB Abdomen: soft, non-distended, non-tender Skin: warm, dry. No edema  ASSESSMENT/PLAN:   HTN (hypertension) BP today 132/84. Patient states home measurements range from 116/70s- 131/80s. Currently stable on Metoprolol tartrate '25mg'$  daily, losartan '100mg'$  daily, Clonidine 0.'1mg'$  daily, and Verapamil '240mg'$  daily. Will not make any adjustments at this time.   Diabetes mellitus without complication (HCC) Last M3V 2 months ago 7.4. Asymptomatic. Continue healthy lifestyle changes. Advised patient to call insurance to check on cost of GLP-1 as it appears to be approved but with copay.   FMLA paperwork completed and signed at visit and given to patient   Shary Key, [place]

## 2022-01-20 NOTE — Patient Instructions (Signed)
It was great seeing you today!  Today we completed your FMLA paperwork.  Also discussed your Mounjaro.  Please contact your insurance to discuss how much this would cost.  Also ask how much Ozempic would cost and if that is cheaper we can switch to that.  Please check-out at the front desk before leaving the clinic. I'd like to see you back in 2 months for your next A1c check, but if you need to be seen earlier than that for any new issues we're happy to fit you in, just give [place] a call!  Feel free to call with any questions or concerns at any time, at [phone].   Take care,  Dr. Shary Key [facility]

## 2022-01-22 ENCOUNTER — Other Ambulatory Visit: Payer: Self-pay | Admitting: Family Medicine

## 2022-01-22 NOTE — Assessment & Plan Note (Signed)
BP today 132/84. Patient states home measurements range from 116/70s- 131/80s. Currently stable on Metoprolol tartrate '25mg'$  daily, losartan '100mg'$  daily, Clonidine 0.'1mg'$  daily, and Verapamil '240mg'$  daily. Will not make any adjustments at this time.

## 2022-01-22 NOTE — Assessment & Plan Note (Signed)
Last A1c 2 months ago 7.4. Asymptomatic. Continue healthy lifestyle changes. Advised patient to call insurance to check on cost of GLP-1 as it appears to be approved but with copay.

## 2022-01-24 ENCOUNTER — Other Ambulatory Visit: Payer: Self-pay | Admitting: Cardiovascular Disease

## 2022-01-30 ENCOUNTER — Other Ambulatory Visit: Payer: Self-pay | Admitting: Family Medicine

## 2022-02-05 ENCOUNTER — Other Ambulatory Visit: Payer: Self-pay | Admitting: Family Medicine

## 2022-02-11 ENCOUNTER — Other Ambulatory Visit: Payer: Self-pay | Admitting: Cardiovascular Disease

## 2022-02-13 ENCOUNTER — Other Ambulatory Visit: Payer: Self-pay | Admitting: Cardiovascular Disease

## 2022-02-19 ENCOUNTER — Other Ambulatory Visit: Payer: Self-pay | Admitting: Family Medicine

## 2022-02-19 ENCOUNTER — Other Ambulatory Visit: Payer: Self-pay | Admitting: Cardiovascular Disease

## 2022-02-20 ENCOUNTER — Telehealth: Payer: Self-pay | Admitting: Cardiovascular Disease

## 2022-02-20 MED ORDER — ROSUVASTATIN CALCIUM 40 MG PO TABS: 40.0000 mg | ORAL_TABLET | Freq: Every day | ORAL | 0 refills | Status: DC

## 2022-02-20 NOTE — Telephone Encounter (Signed)
*  STAT* If patient is at the pharmacy, call can be transferred to refill team.   1. Which medications need to be refilled? (please list name of each medication and dose if known)   rosuvastatin (CRESTOR) 40 MG tablet    2. Which pharmacy/location (including street and city if local pharmacy) is medication to be sent to?  [id number] [place] [facility]. Do they need a 30 day or 90 day supply? [phone]  Pt made f/u for 04/18/22 with Dr. NJohnsie Cancel

## 2022-02-20 NOTE — Telephone Encounter (Signed)
Called patient back to let him know, refill for Crestor was sent in. Patient verbalized understanding.

## 2022-03-11 ENCOUNTER — Other Ambulatory Visit: Payer: Self-pay | Admitting: Family Medicine

## 2022-03-26 ENCOUNTER — Other Ambulatory Visit: Payer: Self-pay | Admitting: Family Medicine

## 2022-04-07 NOTE — Progress Notes (Signed)
Cardiology Office Note   Date:  04/18/2022   ID:  Rodney Lane, DOB [birth year], MRN [id number]  PCP:  Shary Key, DO  Cardiologist:  Dr. Johnsie Cancel    No chief complaint on file.     History of Present Illness: Rodney Lane is a 51 y.o. male who presents for follow up lipids   Chest pain, HTN, obesity OSA on CPAP   Admitted to [facility] 11/07/2018 with acute onset of worsened SOB, CP, palpitations.  In the ER noted to be in ST 150's treated initially with adenosine that had no effect, then IVF, ASA and NTG, rates improved and symptoms improved as well.  CT chest negative for PE, his COVD test here negative, no obvious signs of infection, afebrile, WBC wnl. He also had a TSH that was wnl. CRP and sed rate elevated.  Hgb A1c 6.8.     Echo with LVEF 50-55%, no WMA described, +impaired relaxation, no pericardial effusion.  Pt declined stress test secondary to bad past experience, body habitus and tachycardia precludes coronary CT   He was seen by Dr. Curt Bears and placed on dilt 240 mg daily.  Hydrate.   Suspected ST alone.  BB stopped due to weakness and fatigue.     11/25/18 still felt bad.He had lexiscan myoview neg for ischemia, then had appt with Dr. Curt Bears  And dilt stopped and verapamil ER 360 started and losartan 50 mg daily.  Pt still with SOB so PFTs and pulmonary consult were made. PFTls reviewed 01/02/19 were normal with minimal small airway dx Getting his CPAP arranged through Dr Lamonte Sakai   He has multiple somatic complaints that all seem to be related to being out of shape and overweight with chronic back pain and right sided sciatica   06/28/20 Clonidine started for BP in addition to losartan and verapamil. Diuretic not given due to history of Cr 1.5  Working at [employer] in [place]. Discussed utility of bariatric surgical evaluation  Long discussion with him again about weight loss and bariatric surgery     Past Medical History:  Diagnosis Date   Allergy    thru the year    Anxiety     Blurry vision, bilateral 08/26/2019   Coronary artery calcification    Diabetes mellitus (HCC)    borderline- no meds    Dilatation of aorta (HCC)    GERD (gastroesophageal reflux disease)    past hx- not current    Hepatic steatosis    HTN (hypertension)    Hypertriglyceridemia    Inappropriate sinus tachycardia    Morbid obesity (HCC)    Sleep apnea    wears cpap nightly     Past Surgical History:  Procedure Laterality Date   COLONOSCOPY     2 past colons- 1 civilian, 1 military - normal x 2 per pt    WISDOM TOOTH EXTRACTION Bilateral    2000     Current Outpatient Medications  Medication Sig Dispense Refill   ASPIRIN LOW DOSE 81 MG tablet TAKE 1 TABLET (81 MG TOTAL) BY MOUTH DAILY. SWALLOW WHOLE. 30 tablet 11   cloNIDine (CATAPRES) 0.1 MG tablet Take 1 tablet (0.1 mg total) by mouth 2 (two) times daily. Please make yearly appt with Dr. Johnsie Cancel for March 2023 for future refills. Thank you 1st attempt 60 tablet 1   fenofibrate (TRICOR) 145 MG tablet TAKE 1 TABLET BY MOUTH EVERY DAY 15 tablet 0   fluticasone (FLONASE) 50 MCG/ACT nasal spray Place 2 sprays into both  nostrils daily. 16 g 6   gabapentin (NEURONTIN) 300 MG capsule Take 1 capsule (300 mg total) by mouth at bedtime. 90 capsule 1   losartan (COZAAR) 100 MG tablet TAKE 1 TABLET BY MOUTH EVERY DAY 90 tablet 1   metoprolol tartrate (LOPRESSOR) 25 MG tablet TAKE 1 TABLET BY MOUTH TWICE A DAY 60 tablet 1   rizatriptan (MAXALT-MLT) 10 MG disintegrating tablet Take 1 tablet (10 mg total) by mouth as needed for migraine. May repeat in 2 hours if needed 9 tablet 11   rosuvastatin (CRESTOR) 40 MG tablet Take 1 tablet (40 mg total) by mouth daily. 90 tablet 0   sertraline (ZOLOFT) 25 MG tablet Take 25 mg by mouth daily.     sildenafil (REVATIO) 20 MG tablet Take 3-5 tablets as needed. 60 tablet 2   topiramate (TOPAMAX) 50 MG tablet TAKE 1 TABLET BY MOUTH TWICE A DAY 60 tablet 0   verapamil (CALAN-SR) 240 MG CR tablet TAKE 1 TABLET  BY MOUTH EVERYDAY AT BEDTIME 30 tablet 8   Semaglutide,0.25 or 0.'5MG'$ /DOS, 2 MG/1.5ML SOPN Inject 0.25 mg into the skin once a week. 0.25 mg once weekly for 4 weeks then increase to 0.5 mg weekly for at least 4 weeks,max 1 mg (Patient not taking: Reported on 04/18/2022) 1.5 mL 0   tirzepatide (MOUNJARO) 2.5 MG/0.5ML Pen Inject 2.5 mg into the skin once a week. (Patient not taking: Reported on 04/18/2022) 2 mL 0   No current facility-administered medications for this visit.    Allergies:   Patient has no known allergies.    Social History:  The patient  reports that he has never smoked. He has never used smokeless tobacco. He reports that he does not drink alcohol and does not use drugs.   Family History:  The patient's family history includes Cancer in his father and mother; Colon cancer in his father; Diabetes in his father; Heart disease in his maternal grandfather and paternal grandfather.    ROS:  General:no colds or fevers, + weight increase  Skin:no rashes or ulcers HEENT:no blurred vision, no congestion CV:see HPI PUL:see HPI GI:no diarrhea constipation or melena, no indigestion GU:no hematuria, no dysuria, erectile dysfunction new for pt  MS:no joint pain, no claudication Neuro:no syncope, no lightheadedness Endo:+ diabetes has not been treated, no PCP HgbA1C is 6.8 , no thyroid disease  Wt Readings from Last 3 Encounters:  04/18/22 (!) 310 lb 12.8 oz (141 kg)  01/20/22 (!) 312 lb 6.4 oz (141.7 kg)  11/28/21 (!) 314 lb (142.4 kg)     PHYSICAL EXAM: VS:  BP 128/72   Pulse 81   Ht '5\' 9"'$  (1.753 m)   Wt (!) 310 lb 12.8 oz (141 kg)   SpO2 95%   BMI 45.90 kg/m  , BMI Body mass index is 45.9 kg/m.  Affect appropriate Overweight black male  HEENT: normal Neck supple with no adenopathy JVP normal no bruits no thyromegaly Lungs clear with no wheezing and good diaphragmatic motion Heart:  S1/S2 no murmur, no rub, gallop or click PMI normal Abdomen: benighn, BS positve,  no tenderness, no AAA no bruit.  No HSM or HJR Distal pulses intact with no bruits No edema Neuro non-focal Skin warm and dry No muscular weakness    EKG:   04/18/2022 SR rate 81 normal    Recent Labs: 11/28/2021: BUN 21; Creatinine, Ser 1.23; Potassium 4.2; Sodium 142    Lipid Panel    Component Value Date/Time   CHOL 140  11/28/2021 0856   TRIG 231 (H) 11/28/2021 0856   HDL 32 (L) 11/28/2021 0856   CHOLHDL 4.4 11/28/2021 0856   CHOLHDL 4.6 06/28/2020 1417   VLDL UNABLE TO CALCULATE IF TRIGLYCERIDE OVER 400 mg/dL 06/28/2020 1417   LDLCALC 70 11/28/2021 0856   LDLDIRECT 90 12/13/2020 1222   LDLDIRECT 70.6 06/28/2020 1417       Other studies Reviewed: Additional studies/ records that were reviewed today include: . Echo 11/07/18 IMPRESSIONS      1. The left ventricle has low normal systolic function, with an ejection fraction of 50-55%. The cavity size was normal. There is mildly increased left ventricular wall thickness. Left ventricular diastolic Doppler parameters are consistent with  impaired relaxation.  2. The right ventricle has normal systolic function. The cavity was normal. There is no increase in right ventricular wall thickness.  3. The aortic valve was not well visualized.   FINDINGS  Left Ventricle: The left ventricle has low normal systolic function, with an ejection fraction of 50-55%. The cavity size was normal. There is mildly increased left ventricular wall thickness. Left ventricular diastolic Doppler parameters are consistent  with impaired relaxation.   Right Ventricle: The right ventricle has normal systolic function. The cavity was normal. There is no increase in right ventricular wall thickness.   Left Atrium: Left atrial size was normal in size.   Right Atrium: Right atrial size was normal in size. Right atrial pressure is estimated at 10 mmHg.   Interatrial Septum: No atrial level shunt detected by color flow Doppler.   Pericardium: There  is no evidence of pericardial effusion.   Mitral Valve: The mitral valve was not well visualized. Mitral valve regurgitation is not visualized by color flow Doppler.   Tricuspid Valve: The tricuspid valve is normal in structure. Tricuspid valve regurgitation was not visualized by color flow Doppler.   Aortic Valve: The aortic valve was not well visualized Aortic valve regurgitation was not visualized by color flow Doppler. There is no evidence of aortic valve stenosis.   Pulmonic Valve: The pulmonic valve was normal in structure. Pulmonic valve regurgitation is not visualized by color flow Doppler.   Venous: The inferior vena cava is normal in size with greater than 50% respiratory variability.   nuc study 11/27/18   Study Highlights    Nuclear stress EF: 37%. Generalized hypokinesis noted. Dilated left ventricle. There was no ST segment deviation noted during stress. There was no transient ischemic dilatation. This is an intermediate risk study based upon reduction in ejection fraction. There were no perfusion defects identified suggestive of nonischemic cardiomyopathy.        ASSESSMENT AND PLAN:  1.  Chest pain with neg lexiscan myoview done 11/27/18  Observe   2.  Dyspnea:  Non cardiac EF 60-65% by echo 03/01/20 no valve disease or evidence of pulmonary HTN BNP was normal on 12/19/18  CTA negative for PE on 11/07/18 PFTls    3.  HTN on ARB, calcium blocker and clonidine improved   4.   CKD 2 need to monitor with ARB Cr is 1.23 11/28/21   5.  DM-2 Discussed low carb diet.  Target hemoglobin A1c is 6.5 or less.  Continue current medications. A1c 7.4 11/02/21   6.  Tachycardia improved 81 today followed by Dr. Curt Bears   7.  OSA:  Start CPAP per Dr Lamonte Sakai  8. Lipids: vascepa and lovaza cost prohibitive on fish oil , tricor and crestor  July /2023 LDL 70 70 with  improved triglycerides 997-> 231     FU with me in a year   Signed, Jenkins Rouge, MD  04/18/2022 9:21 AM    [facility] [place], [address], [place], [place] Phone: [phone]; Fax: [phone]

## 2022-04-11 ENCOUNTER — Encounter: Payer: Self-pay | Admitting: Family Medicine

## 2022-04-11 NOTE — Telephone Encounter (Signed)
Called [facility] at [phone]. Tried calling twice but got VM. Left detailed message with prescription info that we sent back in May. Asked them to call back if they do not have/if they need [place] to send updated prescription.

## 2022-04-12 ENCOUNTER — Other Ambulatory Visit: Payer: Self-pay

## 2022-04-12 MED ORDER — GABAPENTIN 300 MG PO CAPS: 300.0000 mg | ORAL_CAPSULE | Freq: Every day | ORAL | 1 refills | Status: DC

## 2022-04-14 ENCOUNTER — Other Ambulatory Visit: Payer: Self-pay | Admitting: Family Medicine

## 2022-04-18 ENCOUNTER — Encounter: Payer: Self-pay | Admitting: Cardiovascular Disease

## 2022-04-18 ENCOUNTER — Ambulatory Visit: Payer: BC Managed Care – PPO | Attending: Cardiovascular Disease | Admitting: Cardiovascular Disease

## 2022-04-18 VITALS — BP 128/72 | HR 81 | Ht 69.0 in | Wt 310.8 lb

## 2022-04-18 DIAGNOSIS — E782 Mixed hyperlipidemia: Secondary | ICD-10-CM

## 2022-04-18 DIAGNOSIS — R0609 Other forms of dyspnea: Secondary | ICD-10-CM

## 2022-04-18 DIAGNOSIS — I10 Essential (primary) hypertension: Secondary | ICD-10-CM

## 2022-04-18 MED ORDER — ROSUVASTATIN CALCIUM 40 MG PO TABS: 40.0000 mg | ORAL_TABLET | Freq: Every day | ORAL | 3 refills | Status: DC

## 2022-04-18 MED ORDER — CLONIDINE HCL 0.1 MG PO TABS: 0.1000 mg | ORAL_TABLET | Freq: Two times a day (BID) | ORAL | 3 refills | Status: DC

## 2022-04-18 NOTE — Patient Instructions (Signed)
Medication Instructions:  No changes. *If you need a refill on your cardiac medications before your next appointment, please call your pharmacy*   Lab Work: None.   Follow-Up: At [facility], you and your health needs are our priority.  As part of our continuing mission to provide you with exceptional heart care, we have created designated Provider Care Teams.  These Care Teams include your primary Cardiologist (physician) and Advanced Practice Providers (APPs -  Physician Assistants and Nurse Practitioners) who all work together to provide you with the care you need, when you need it.    Your next appointment:   1 year(s)  The format for your next appointment:   In Person  Provider:   Jenkins Rouge, MD      Important Information About Sugar

## 2022-04-28 ENCOUNTER — Other Ambulatory Visit: Payer: Self-pay | Admitting: Family Medicine

## 2022-05-13 ENCOUNTER — Other Ambulatory Visit: Payer: Self-pay | Admitting: Family Medicine

## 2022-05-28 ENCOUNTER — Other Ambulatory Visit: Payer: Self-pay | Admitting: Family Medicine

## 2022-06-12 ENCOUNTER — Other Ambulatory Visit: Payer: Self-pay | Admitting: Family Medicine

## 2022-06-25 ENCOUNTER — Other Ambulatory Visit: Payer: Self-pay | Admitting: Family Medicine

## 2022-07-08 ENCOUNTER — Other Ambulatory Visit: Payer: Self-pay | Admitting: Family Medicine

## 2022-07-23 ENCOUNTER — Other Ambulatory Visit: Payer: Self-pay | Admitting: Family Medicine

## 2022-08-06 ENCOUNTER — Other Ambulatory Visit: Payer: Self-pay | Admitting: Family Medicine

## 2022-08-06 DIAGNOSIS — R000 Tachycardia, unspecified: Secondary | ICD-10-CM

## 2022-08-07 ENCOUNTER — Other Ambulatory Visit: Payer: Self-pay | Admitting: Cardiovascular Disease

## 2022-08-14 ENCOUNTER — Encounter: Payer: Self-pay | Admitting: Family Medicine

## 2022-08-14 ENCOUNTER — Ambulatory Visit (INDEPENDENT_AMBULATORY_CARE_PROVIDER_SITE_OTHER): Payer: BC Managed Care – PPO | Admitting: Family Medicine

## 2022-08-14 ENCOUNTER — Telehealth: Payer: Self-pay | Admitting: Family Medicine

## 2022-08-14 VITALS — BP 122/78 | HR 102 | Ht 69.0 in | Wt 311.0 lb

## 2022-08-14 DIAGNOSIS — I10 Essential (primary) hypertension: Secondary | ICD-10-CM

## 2022-08-14 DIAGNOSIS — E119 Type 2 diabetes mellitus without complications: Secondary | ICD-10-CM | POA: Diagnosis not present

## 2022-08-14 DIAGNOSIS — R519 Headache, unspecified: Secondary | ICD-10-CM

## 2022-08-14 DIAGNOSIS — G43909 Migraine, unspecified, not intractable, without status migrainosus: Secondary | ICD-10-CM

## 2022-08-14 LAB — POCT GLYCOSYLATED HEMOGLOBIN (HGB A1C): HbA1c, POC (controlled diabetic range): 8.2 % — AB (ref 0.0–7.0)

## 2022-08-14 MED ORDER — TOPIRAMATE 50 MG PO TABS: 50.0000 mg | ORAL_TABLET | Freq: Two times a day (BID) | ORAL | 0 refills | Status: DC

## 2022-08-14 MED ORDER — METFORMIN HCL ER 500 MG PO TB24: 500.0000 mg | ORAL_TABLET | Freq: Two times a day (BID) | ORAL | 3 refills | Status: DC

## 2022-08-14 NOTE — Progress Notes (Signed)
    SUBJECTIVE:   CHIEF COMPLAINT / HPI:   Patient presents for follow up. Last seen in August   Migraines: states pain is in the right side of his head that goes down to the neck or moves to the left. Every once in a while feels he has a little blurry vision which he states is monthly. Gets migraines every 2-3 weeks. Follows with neurology   Sciatic nerve still really bothering him. Feels pain is on the right lower back that radiates down his leg. Still has thigh numbness and pain. Feels it when walking or sitting. Takes Gabapentin, Tylenol, biofreeze.   States he is thinking about gastric bypass surgery . Worried because it will be the first surgery he has and is nervous about it. States cardiologist cleared him. Open to bariatric surgery- would like to start process   PERTINENT  PMH / PSH: Reviewed   OBJECTIVE:   BP 122/78   Pulse (!) 102   Ht 5\' 9"  (1.753 m)   Wt (!) 311 lb (141.1 kg)   SpO2 96%   BMI 45.93 kg/m    Physical exam General: well appearing, NAD Cardiovascular: Mildly tachycardic, no murmurs Lungs: CTAB. Normal WOB Abdomen: soft, non-distended, non-tender Skin: warm, dry. No edema Neuro: alert and oriented. CN 2-12 in tact.   ASSESSMENT/PLAN:   Diabetes mellitus without complication (HCC) Current A1c 8.2 Last A1c 7.4 in June. Starting on Metformin 500mg  BID. Advised to just take once for the first few days and if tolerating it can increase to twice a day. After starting Metformin will try to send in GLP-1 again. Discussed lifestyle changes as well and weight loss will be beneficial for him. Gave him the website to start the bariatric surgery process.   HTN (hypertension) BP stable today at 122/78. Taking losartan 100 mg daily, verapamil, clonidine .1 g twice daily. He is not quite sure why he is taking clonidine. Will not adjust medications at this time but perhaps in the future if his Bps continue to be controlled can wean off of clonidine. Last BMP 8 months  ago with normal kidney function. Repeat BMP at 1 year.   Headache Partially controlled on Topamax and Rizatriptan but still getting migraines every 2-3 weeks. Was only taking Topamax 50mg  daily, discussed taking it twice a day and if needed we can increase the dose further. Normal neuro exam today. Recommended scheduling Neuro follow up as well.     [place]

## 2022-08-14 NOTE — Patient Instructions (Signed)
It was great seeing you today!  Started on the bariatric surgery process you have to visit the website: [facility] set up for the initial consultation which can be in person or online.  Your A1c was elevated at 8.2 your starting you on metformin to take 500 mg once a day with meals for the first week, and then increase to 500 mg twice a day.  I recommend taking Topamax 50 mg twice a day, and if that does not help we can increase it to 100 mg twice a day.  I do recommend telling your neurologist about this as well when you see them.  I recommend continuing your blood pressure medications at this time since your blood pressure is very well-controlled.  Please check-out at the front desk before leaving the clinic. I'd like to see you back in 2-3 weeks to see how you are doing with the medication changes, but if you need to be seen earlier than that for any new issues we're happy to fit you in, just give [place] a call!  Feel free to call with any questions or concerns at any time, at [phone].   Take care,  Dr. Shary Key [facility]

## 2022-08-16 DIAGNOSIS — R519 Headache, unspecified: Secondary | ICD-10-CM | POA: Insufficient documentation

## 2022-08-16 DIAGNOSIS — G43909 Migraine, unspecified, not intractable, without status migrainosus: Secondary | ICD-10-CM | POA: Insufficient documentation

## 2022-08-16 NOTE — Assessment & Plan Note (Signed)
Partially controlled on Topamax and Rizatriptan but still getting migraines every 2-3 weeks. Was only taking Topamax 50mg  daily, discussed taking it twice a day and if needed we can increase the dose further. Normal neuro exam today. Recommended scheduling Neuro follow up as well.

## 2022-08-16 NOTE — Assessment & Plan Note (Signed)
BP stable today at 122/78. Taking losartan 100 mg daily, verapamil, clonidine .1 g twice daily. He is not quite sure why he is taking clonidine. Will not adjust medications at this time but perhaps in the future if his Bps continue to be controlled can wean off of clonidine. Last BMP 8 months ago with normal kidney function. Repeat BMP at 1 year.

## 2022-08-16 NOTE — Assessment & Plan Note (Signed)
Current A1c 8.2 Last A1c 7.4 in June. Starting on Metformin 500mg  BID. Advised to just take once for the first few days and if tolerating it can increase to twice a day. After starting Metformin will try to send in GLP-1 again. Discussed lifestyle changes as well and weight loss will be beneficial for him. Gave him the website to start the bariatric surgery process.

## 2022-09-03 ENCOUNTER — Other Ambulatory Visit: Payer: Self-pay | Admitting: Family Medicine

## 2022-09-28 ENCOUNTER — Other Ambulatory Visit: Payer: Self-pay | Admitting: Family Medicine

## 2022-09-28 NOTE — Patient Instructions (Signed)
Below is our plan:  We will continue gabapentin 300mg  daily. I would recommend taking topiramate 100mg  daily. Continue rizatriptan as needed.   Please make sure you are staying well hydrated. I recommend 50-60 ounces daily. Well balanced diet and regular exercise encouraged. Consistent sleep schedule with 6-8 hours recommended.   Please continue follow up with care team as directed.   Follow up with me in 1 year   You may receive a survey regarding today's visit. I encourage you to leave honest feed back as I do use this information to improve patient care. Thank you for seeing me today!   GENERAL HEADACHE INFORMATION:   Natural supplements: Magnesium Oxide or Magnesium Glycinate 500 mg at bed (up to 800 mg daily) Coenzyme Q10 300 mg in AM Vitamin B2- 200 mg twice a day   Add 1 supplement at a time since even natural supplements can have undesirable side effects. You can sometimes buy supplements cheaper (especially Coenzyme Q10) at www.WebmailGuide.co.za or at [employer].   Vitamins and herbs that show potential:   Magnesium: Magnesium (250 mg twice a day or 500 mg at bed) has a relaxant effect on smooth muscles such as blood vessels. Individuals suffering from frequent or daily headache usually have low magnesium levels which can be increase with daily supplementation of 400-750 mg. Three trials found 40-90% average headache reduction  when used as a preventative. Magnesium also demonstrated the benefit in menstrually related migraine.  Magnesium is part of the messenger system in the serotonin cascade and it is a good muscle relaxant.  It is also useful for constipation which can be a side effect of other medications used to treat migraine. Good sources include nuts, whole grains, and tomatoes. Side Effects: loose stool/diarrhea  Riboflavin (vitamin B 2) 200 mg twice a day. This vitamin assists nerve cells in the production of ATP a principal energy storing molecule.  It is necessary for many  chemical reactions in the body.  There have been at least 3 clinical trials of riboflavin using 400 mg per day all of which suggested that migraine frequency can be decreased.  All 3 trials showed significant improvement in over half of migraine sufferers.  The supplement is found in bread, cereal, milk, meat, and poultry.  Most Americans get more riboflavin than the recommended daily allowance, however riboflavin deficiency is not necessary for the supplements to help prevent headache. Side effects: energizing, green urine   Coenzyme Q10: This is present in almost all cells in the body and is critical component for the conversion of energy.  Recent studies have shown that a nutritional supplement of CoQ10 can reduce the frequency of migraine attacks by improving the energy production of cells as with riboflavin.  Doses of 150 mg twice a day have been shown to be effective.   Melatonin: Increasing evidence shows correlation between melatonin secretion and headache conditions.  Melatonin supplementation has decreased headache intensity and duration.  It is widely used as a sleep aid.  Sleep is natures way of dealing with migraine.  A dose of 3 mg is recommended to start for headaches including cluster headache. Higher doses up to 15 mg has been reviewed for use in Cluster headache and have been used. The rationale behind using melatonin for cluster is that many theories regarding the cause of Cluster headache center around the disruption of the normal circadian rhythm in the brain.  This helps restore the normal circadian rhythm.   HEADACHE DIET: Foods and beverages which  may trigger migraine Note that only 20% of headache patients are food sensitive. You will know if you are food sensitive if you get a headache consistently 20 minutes to 2 hours after eating a certain food. Only cut out a food if it causes headaches, otherwise you might remove foods you enjoy! What matters most for diet is to eat a well  balanced healthy diet full of vegetables and low fat protein, and to not miss meals.   Chocolate, other sweets ALL cheeses except cottage and cream cheese Dairy products, yogurt, sour cream, ice cream Liver Meat extracts (Bovril, Marmite, meat tenderizers) Meats or fish which have undergone aging, fermenting, pickling or smoking. These include: Hotdogs,salami,Lox,sausage, mortadellas,smoked salmon, pepperoni, Pickled herring Pods of broad bean (English beans, Chinese pea pods, [place] (fava) beans, lima and navy beans Ripe avocado, ripe banana Yeast extracts or active yeast preparations such as Brewer's or Fleishman's (commercial bakes goods are permitted) Tomato based foods, pizza (lasagna, etc.)   MSG (monosodium glutamate) is disguised as many things; look for these common aliases: Monopotassium glutamate Autolysed yeast Hydrolysed protein Sodium caseinate "flavorings" "all natural preservatives" Nutrasweet   Avoid all other foods that convincingly provoke headaches.   Resources: The Dizzy [place] Laundry Your Headache Diet, migrainestrong.com  https://zamora-andrews.com/   Caffeine and Migraine For patients that have migraine, caffeine intake more than 3 days per week can lead to dependency and increased migraine frequency. I would recommend cutting back on your caffeine intake as best you can. The recommended amount of caffeine is 200-300 mg daily, although migraine patients may experience dependency at even lower doses. While you may notice an increase in headache temporarily, cutting back will be helpful for headaches in the long run. For more information on caffeine and migraine, visit: https://americanmigrainefoundation.org/resource-library/caffeine-and-migraine/   Headache Prevention Strategies:   1. Maintain a headache diary; learn to identify and avoid triggers.  - This can be a simple note where you log when you had a headache,  associated symptoms, and medications used - There are several smartphone apps developed to help track migraines: Migraine Buddy, Migraine Monitor, Curelator N1-Headache App   Common triggers include: Emotional triggers: Emotional/Upset family or friends Emotional/Upset occupation Business reversal/success Anticipation anxiety Crisis-serious Post-crisis periodNew job/position   Physical triggers: Vacation Day Weekend Strenuous Exercise High Altitude Location New Move Menstrual Day Physical Illness Oversleep/Not enough sleep Weather changes Light: Photophobia or light sesnitivity treatment involves a balance between desensitization and reduction in overly strong input. Use dark polarized glasses outside, but not inside. Avoid bright or fluorescent light, but do not dim environment to the point that going into a normally lit room hurts. Consider FL-41 tint lenses, which reduce the most irritating wavelengths without blocking too much light.  These can be obtained at axonoptics.com or theraspecs.com Foods: see list above.   2. Limit use of acute treatments (over-the-counter medications, triptans, etc.) to no more than 2 days per week or 10 days per month to prevent medication overuse headache (rebound headache).     3. Follow a regular schedule (including weekends and holidays): Don't skip meals. Eat a balanced diet. 8 hours of sleep nightly. Minimize stress. Exercise 30 minutes per day. Being overweight is associated with a 5 times increased risk of chronic migraine. Keep well hydrated and drink 6-8 glasses of water per day.   4. Initiate non-pharmacologic measures at the earliest onset of your headache. Rest and quiet environment. Relax and reduce stress. Breathe2Relax is a free app that can instruct you on  some simple relaxtion and breathing techniques. Http://Dawnbuse.com is a    free website that provides teaching videos on relaxation.  Also, there are  many apps that   can be  downloaded for "mindful" relaxation.  An app called YOGA NIDRA will help walk you through mindfulness. Another app called Calm can be downloaded to give you a structured mindfulness guide with daily reminders and skill development. Headspace for guided meditation Mindfulness Based Stress Reduction Online Course: www.palousemindfulness.com Cold compresses.   5. Don't wait!! Take the maximum allowable dosage of prescribed medication at the first sign of migraine.   6. Compliance:  Take prescribed medication regularly as directed and at the first sign of a migraine.   7. Communicate:  Call your physician when problems arise, especially if your headaches change, increase in frequency/severity, or become associated with neurological symptoms (weakness, numbness, slurred speech, etc.).   8. Headache/pain management therapies: Consider various complementary methods, including medication, behavioral therapy, psychological counselling, biofeedback, massage therapy, acupuncture, dry needling, and other modalities.  Such measures may reduce the need for medications. Counseling for pain management, where patients learn to function and ignore/minimize their pain, seems to work very well.   9. Recommend changing family's attention and focus away from patient's headaches. Instead, emphasize daily activities. If first question of day is 'How are your headaches/Do you have a headache today?', then patient will constantly think about headaches, thus making them worse. Goal is to re-direct attention away from headaches, toward daily activities and other distractions.   10. Helpful Websites: www.AmericanHeadacheSociety.org PatentHood.ch www.headaches.org TightMarket.nl www.achenet.org

## 2022-09-28 NOTE — Progress Notes (Signed)
Chief Complaint  Patient presents with   Follow-up    Pt in room 2. Here for migraine follow up. Pt said migraines start on right side and left side of head and then moves to eyes. Does causes a little blurry vision. Still takes gabapentin and Topamax. Last migraine was last week.       HISTORY OF PRESENT ILLNESS:  10/02/22 ALL:  Rodney Lane returns for follow up for migraines. He was doing well at last visit 09/2021. He was advised to continue gabapentin 300mg  QHS. PCP had refilled topiramate 50mg  BID but he reported only taking QD. Rizatriptan continued for abortive therapy. Since, he reports having 4-8 headache days with 4 of these being migrainous. Rizatriptan works well for abortive therapy. He averages 2-3 doses per month. He takes gabapentin as prescribed but did not increase dose of topiramate. He continues CPAP therapy. He has not been seen by Dr Delton Coombes recently. He continues to work nights.   09/26/2021 ALL: Rodney Lane returns for follow up for migraines. We discontinued topiramate and started gabapentin 300QHS at last visit 03/2021. He switched to daytime as he is working nights. He feels that gabapentin did seem to help some. He reports having two headache days a month. Both are migrainous. No tension headaches. He does feel rizatriptan has helped. He is taking OTC analgesics 4-8 times a month. He is followed regularly by Dr Idalia Needle, PCP. He continues verapamil 240mg  daily. BP is well managed. PCP recently refilled topiramate but he is not certain he is taking it.   03/29/2021 ALL:  Rodney Lane is a 52 y.o. male here today for follow up for migraines. He was started on topiramate 50mg  twice daily. He does not feel that this has helped. He continues to have 4-8 headache days per month with 1-2 migrainous headaches. Rizatriptan has not helped. He has experienced more shock like waves throughout body. Mostly on right side. He was advised to follow up with PCP for concerns of right arm and right thigh  numbness. He has not had a chance to check in with PCP. He was given a number to call for PT but has not reached out. He has tried to loosen belt. Pain is worse when at work and standing up, better when lying down. He is using CPAP most every day. He works nights.    HISTORY (copied from Dr Richrd Humbles previous note)  52 year old male with headaches, arm pain, leg pain.   Patient reports occipital and right-sided headaches with dizziness, nausea, photophobia and blurred vision for past 5 years, 3-4 times a month.  Patient has family history of migraine in mother.  He has never been officially diagnosed with migraine.  He takes over-the-counter medications with mild relief.   Patient also having intermittent shooting pains and tingling sensation in right greater than left arms.  Also has right hip pain, numbness in the right thigh, with chronic low back pain since 1993.   Patient also has borderline diabetes, hypertension, hypertriglyceridemia, sleep apnea, obesity, anxiety and depression.   REVIEW OF SYSTEMS: Out of a complete 14 system review of symptoms, the patient complains only of the following symptoms, headaches, numbness, pain and all other reviewed systems are negative.   ALLERGIES: No Known Allergies   HOME MEDICATIONS: Outpatient Medications Prior to Visit  Medication Sig Dispense Refill   ASPIRIN LOW DOSE 81 MG tablet TAKE 1 TABLET (81 MG TOTAL) BY MOUTH DAILY. SWALLOW WHOLE. 30 tablet 11   cloNIDine (CATAPRES) 0.1 MG tablet  Take 1 tablet (0.1 mg total) by mouth 2 (two) times daily. 180 tablet 3   fenofibrate (TRICOR) 145 MG tablet TAKE 1 TABLET BY MOUTH EVERY DAY 30 tablet 2   fluticasone (FLONASE) 50 MCG/ACT nasal spray Place 2 sprays into both nostrils daily. 16 g 6   losartan (COZAAR) 100 MG tablet TAKE 1 TABLET BY MOUTH EVERY DAY 30 tablet 5   metFORMIN (GLUCOPHAGE-XR) 500 MG 24 hr tablet Take 1 tablet (500 mg total) by mouth 2 (two) times daily with a meal. 180 tablet 3    metoprolol tartrate (LOPRESSOR) 25 MG tablet TAKE 1 TABLET BY MOUTH TWICE A DAY 60 tablet 1   rizatriptan (MAXALT-MLT) 10 MG disintegrating tablet TAKE 1 TABLET BY MOUTH AS NEEDED FOR MIGRAINE. MAY REPEAT IN 2 HOURS IF NEEDED 9 tablet 11   rosuvastatin (CRESTOR) 40 MG tablet Take 1 tablet (40 mg total) by mouth daily. 90 tablet 3   Semaglutide,0.25 or 0.5MG /DOS, 2 MG/1.5ML SOPN Inject 0.25 mg into the skin once a week. 0.25 mg once weekly for 4 weeks then increase to 0.5 mg weekly for at least 4 weeks,max 1 mg 1.5 mL 0   sertraline (ZOLOFT) 25 MG tablet Take 25 mg by mouth daily.     sildenafil (REVATIO) 20 MG tablet Take 3-5 tablets as needed. 60 tablet 2   tirzepatide (MOUNJARO) 2.5 MG/0.5ML Pen Inject 2.5 mg into the skin once a week. 2 mL 0   topiramate (TOPAMAX) 50 MG tablet TAKE 1 TABLET BY MOUTH TWICE A DAY 60 tablet 0   verapamil (CALAN-SR) 240 MG CR tablet TAKE 1 TABLET BY MOUTH EVERYDAY AT BEDTIME 90 tablet 3   gabapentin (NEURONTIN) 300 MG capsule Take 1 capsule (300 mg total) by mouth at bedtime. 90 capsule 1   No facility-administered medications prior to visit.     PAST MEDICAL HISTORY: Past Medical History:  Diagnosis Date   Allergy    thru the year    Anxiety    Blurry vision, bilateral 08/26/2019   Coronary artery calcification    Diabetes mellitus (HCC)    borderline- no meds    Dilatation of aorta (HCC)    GERD (gastroesophageal reflux disease)    past hx- not current    Hepatic steatosis    HTN (hypertension)    Hypertriglyceridemia    Inappropriate sinus tachycardia    Morbid obesity (HCC)    Sleep apnea    wears cpap nightly      PAST SURGICAL HISTORY: Past Surgical History:  Procedure Laterality Date   COLONOSCOPY     2 past colons- 1 civilian, 1 military - normal x 2 per pt    WISDOM TOOTH EXTRACTION Bilateral    2000     FAMILY HISTORY: Family History  Problem Relation Age of Onset   Cancer Mother    Cancer Father    Diabetes Father     Colon cancer Father        dx'd in his 57's , died in 55's    Heart disease Maternal Grandfather        diagnosed 47s   Heart disease Paternal Grandfather        diagnosed 72s   Colon polyps Neg Hx    Esophageal cancer Neg Hx    Rectal cancer Neg Hx    Stomach cancer Neg Hx      SOCIAL HISTORY: Social History   Socioeconomic History   Marital status: Single    Spouse name: Not  on file   Number of children: 0   Years of education: Not on file   Highest education level: Associate degree: academic program  Occupational History    Comment: Production designer, theatre/television/film  Tobacco Use   Smoking status: Never   Smokeless tobacco: Never  Vaping Use   Vaping Use: Never used  Substance and Sexual Activity   Alcohol use: Never   Drug use: Never   Sexual activity: Not on file  Other Topics Concern   Not on file  Social History Narrative   Not on file   Social Determinants of Health   Financial Resource Strain: Not on file  Food Insecurity: Not on file  Transportation Needs: Not on file  Physical Activity: Not on file  Stress: Not on file  Social Connections: Not on file  Intimate Partner Violence: Not on file     PHYSICAL EXAM  Vitals:   10/02/22 0911  BP: 119/84  Pulse: 88  Weight: (!) 314 lb 8 oz (142.7 kg)  Height: 5\' 9"  (1.753 m)     Body mass index is 46.44 kg/m.  Generalized: Well developed, in no acute distress  Cardiology: normal rate and rhythm, no murmur auscultated  Respiratory: clear to auscultation bilaterally    Neurological examination  Mentation: Alert oriented to time, place, history taking. Follows all commands speech and language fluent Cranial nerve II-XII: Pupils were equal round reactive to light. Extraocular movements were full, visual field were full on confrontational test. Facial sensation and strength were normal. Head turning and shoulder shrug  were normal and symmetric. Motor: The motor testing reveals 5 over 5 strength of all 4  extremities. Good symmetric motor tone is noted throughout.  Sensory: Sensory testing is intact to soft touch on all 4 extremities. No evidence of extinction is noted.  Gait and station: Gait is normal.    DIAGNOSTIC DATA (LABS, IMAGING, TESTING) - I reviewed patient records, labs, notes, testing and imaging myself where available.  Lab Results  Component Value Date   WBC 7.7 07/15/2019   HGB 14.1 07/15/2019   HCT 43.4 07/15/2019   MCV 93.7 07/15/2019   PLT 233 07/15/2019      Component Value Date/Time   NA 142 11/28/2021 0856   K 4.2 11/28/2021 0856   CL 107 (H) 11/28/2021 0856   CO2 20 11/28/2021 0856   GLUCOSE 123 (H) 11/28/2021 0856   GLUCOSE 107 (H) 02/28/2021 1520   BUN 21 11/28/2021 0856   CREATININE 1.23 11/28/2021 0856   CALCIUM 9.8 11/28/2021 0856   PROT 7.3 02/28/2021 1520   PROT 7.9 12/13/2020 1222   ALBUMIN 4.4 02/28/2021 1520   ALBUMIN 5.1 (H) 12/13/2020 1222   AST 32 02/28/2021 1520   ALT 45 (H) 02/28/2021 1520   ALKPHOS 39 02/28/2021 1520   BILITOT 0.3 02/28/2021 1520   BILITOT 0.4 12/13/2020 1222   GFRNONAA >60 02/28/2021 1520   GFRAA 106 03/25/2020 1000   Lab Results  Component Value Date   CHOL 140 11/28/2021   HDL 32 (L) 11/28/2021   LDLCALC 70 11/28/2021   LDLDIRECT 90 12/13/2020   TRIG 231 (H) 11/28/2021   CHOLHDL 4.4 11/28/2021   Lab Results  Component Value Date   HGBA1C 8.2 (A) 08/14/2022   No results found for: "VITAMINB12" Lab Results  Component Value Date   TSH 2.400 11/06/2018        No data to display  No data to display           ASSESSMENT AND PLAN  52 y.o. year old male  has a past medical history of Allergy, Anxiety, Blurry vision, bilateral (08/26/2019), Coronary artery calcification, Diabetes mellitus (HCC), Dilatation of aorta (HCC), GERD (gastroesophageal reflux disease), Hepatic steatosis, HTN (hypertension), Hypertriglyceridemia, Inappropriate sinus tachycardia, Morbid obesity (HCC), and  Sleep apnea. here with    Migraine with aura and without status migrainosus, not intractable  Leiam reports having 4 migraine headaches a month easily aborted with rizatriptan. He has tolerated gabapentin 300mg  daily. We will continue gabapentin 300mg  daily, topiramate and rizatriptan (currently written by PCP). I have encouraged him to increase topiramate dose to 100mg  daily if unable to take 50mg  BID. He will continue close follow up with PCP as directed. Healthy lifestyle habits encouraged. He will follow up in 1 year, sooner if needed.    No orders of the defined types were placed in this encounter.     Meds ordered this encounter  Medications   gabapentin (NEURONTIN) 300 MG capsule    Sig: Take 1 capsule (300 mg total) by mouth at bedtime.    Dispense:  90 capsule    Refill:  1    Order Specific Question:   Supervising Provider    Answer:   Anson Fret [[id number]]       AVW UJWJX, MSN, FNP-C 10/02/2022, 9:41 AM  [facility] [address] [place], [place] [address] [phone]

## 2022-10-02 ENCOUNTER — Encounter: Payer: Self-pay | Admitting: Family Medicine

## 2022-10-02 ENCOUNTER — Ambulatory Visit: Payer: BC Managed Care – PPO | Admitting: Family Medicine

## 2022-10-02 VITALS — BP 119/84 | HR 88 | Ht 69.0 in | Wt 314.5 lb

## 2022-10-02 DIAGNOSIS — G43109 Migraine with aura, not intractable, without status migrainosus: Secondary | ICD-10-CM

## 2022-10-02 MED ORDER — GABAPENTIN 300 MG PO CAPS: 300.0000 mg | ORAL_CAPSULE | Freq: Every day | ORAL | 1 refills | Status: DC

## 2022-10-11 ENCOUNTER — Encounter: Payer: Self-pay | Admitting: Family Medicine

## 2022-10-11 NOTE — Telephone Encounter (Signed)
Checked drug registry since gabapentin controlled in [place]. Per drug registry, pt last refilled 07/10/22 #90. He should be eligible for a refill.  Called [facility] at [phone]. Spoke w/ tech. He requested on 10/02/22 and it was too soon for a refill at that time. They will get rx ready for pt.

## 2022-10-13 ENCOUNTER — Ambulatory Visit (INDEPENDENT_AMBULATORY_CARE_PROVIDER_SITE_OTHER): Payer: BC Managed Care – PPO | Admitting: Family Medicine

## 2022-10-13 ENCOUNTER — Other Ambulatory Visit: Payer: Self-pay

## 2022-10-13 ENCOUNTER — Encounter: Payer: Self-pay | Admitting: Family Medicine

## 2022-10-13 VITALS — BP 128/88 | HR 81 | Ht 69.0 in | Wt 311.6 lb

## 2022-10-13 DIAGNOSIS — E119 Type 2 diabetes mellitus without complications: Secondary | ICD-10-CM | POA: Diagnosis not present

## 2022-10-13 DIAGNOSIS — Z0100 Encounter for examination of eyes and vision without abnormal findings: Secondary | ICD-10-CM | POA: Diagnosis not present

## 2022-10-13 NOTE — Patient Instructions (Addendum)
It was great seeing you today!  Today we discussed many things. Your blood pressure looks great, please continue to take your medications. We will plan to get blood work to check electrolytes and kidney function at the next visit.  Your last A1c was 8.2, we will check another level in 1 month. Please continue to take all your diabetes medications.  I am glad that you migraines have improved, please continue with the topamax 50 mg twice daily and take the rizatriptan 10 mg as needed. I am glad you seem to be having very few bad days where you have a severe migraine and need rizatriptan.   Please follow up at your next scheduled appointment on 6/17 at 1:30 pm, if anything arises between now and then, please don't hesitate to contact our office.   Thank you for allowing [place] to be a part of your medical care!  Thank you, Dr. Robyne Peers

## 2022-10-13 NOTE — Progress Notes (Unsigned)
    SUBJECTIVE:   CHIEF COMPLAINT / HPI:   Patient presents with history of migraines, seems to be ongoing since last visit and has been taking place for the past year. Pain starts along the back of the right part of his head and goes to his eye. Most commonly on the right side but can occur on the left as well. Gets brief blurry vision changes. Headache takes about 15-20 minutes, sits or lays down which helps it clear out. Tries to sleep it off. Uses a wet towel rag in a dark room which can also help. Takes topamax 50 mg bid and rizatriptan 10 mg as needed. Since last visit, he has needed rizatriptan over the past few weeks. Headaches have improved. He would like to keep the regimen the same.   Denies chest pain, dyspnea and leg swell. Compliant on antihypertensive regimen.   History of DM, compliant DM regimen. Last saw ophthalmologist more than a year ago. Last A1c 8.2 about 2 months ago.   OBJECTIVE:   BP 128/88   Pulse 81   Ht 5\' 9"  (1.753 m)   Wt (!) 311 lb 9.6 oz (141.3 kg)   SpO2 99%   BMI 46.02 kg/m   General: Patient well-appearing, in no acute distress.  HEENT: PERRLA, no temporal tenderness CV: RRR, no murmurs or gallops auscultated Resp: CTAB, no wheezing, rales or rhonchi noted Ext: no LE edema noted bilaterally Neuro: CN 2-12 grossly intact, 5/5 UE and LE strength bilaterally, gross sensation intact, normal gait Psych: mood appropriate, pleasant   ASSESSMENT/PLAN:   Headache -seem most consistent with migraine without aura, improved since last visit with PCP. Reassuringly no red flag symptoms and unremarkable neurological exam -per shared decision making, will continue topamax 50 mg bid, discussed possibly increasing dose and decided on increasing dose as needed on days with worsening migraine severity which patient agrees -continue rizatriptan 10 mg as needed -follow up with PCP in 1 month scheduled for 6/17 to monitor progression   HTN (hypertension) -BP at  goal -continue current antihypertensive regimen, without changes made toady  -follow up in 1 month, plan to obtain repeat BMP at next visit to monitor electrolytes and renal function   Diabetes mellitus without complication (HCC) -chronic and stable -continue current DM regimen -ophthalmology referral placed, discussed the importance of at minimum yearly vision screenings given DM diagnosis  -follow up in 1 month for repeat A1c, consider further adjusting insulin at that time if appropriate     -PHQ-9 score of 3 with negative question 9 reviewed.   Reece Leader, DO [facility]

## 2022-10-14 NOTE — Assessment & Plan Note (Signed)
-  BP at goal -continue current antihypertensive regimen, without changes made toady  -follow up in 1 month, plan to obtain repeat BMP at next visit to monitor electrolytes and renal function

## 2022-10-14 NOTE — Assessment & Plan Note (Signed)
-  chronic and stable -continue current DM regimen -ophthalmology referral placed, discussed the importance of at minimum yearly vision screenings given DM diagnosis  -follow up in 1 month for repeat A1c, consider further adjusting insulin at that time if appropriate

## 2022-10-14 NOTE — Assessment & Plan Note (Signed)
-  seem most consistent with migraine without aura, improved since last visit with PCP. Reassuringly no red flag symptoms and unremarkable neurological exam -per shared decision making, will continue topamax 50 mg bid, discussed possibly increasing dose and decided on increasing dose as needed on days with worsening migraine severity which patient agrees -continue rizatriptan 10 mg as needed -follow up with PCP in 1 month scheduled for 6/17 to monitor progression

## 2022-10-18 ENCOUNTER — Other Ambulatory Visit: Payer: Self-pay | Admitting: Family Medicine

## 2022-10-26 ENCOUNTER — Encounter: Payer: Self-pay | Admitting: Family Medicine

## 2022-10-27 ENCOUNTER — Ambulatory Visit: Payer: BC Managed Care – PPO | Admitting: Student

## 2022-10-27 ENCOUNTER — Other Ambulatory Visit: Payer: Self-pay

## 2022-10-27 DIAGNOSIS — J302 Other seasonal allergic rhinitis: Secondary | ICD-10-CM

## 2022-10-27 MED ORDER — CETIRIZINE HCL 10 MG PO TABS: 10.0000 mg | ORAL_TABLET | Freq: Every day | ORAL | 11 refills | Status: DC | PRN

## 2022-10-27 MED ORDER — FLUTICASONE PROPIONATE 50 MCG/ACT NA SUSP: 2.0000 | Freq: Every day | NASAL | 6 refills | Status: DC

## 2022-10-27 NOTE — Assessment & Plan Note (Signed)
Patient presents with 2 to 3 weeks of congestion that led to cough/coughing fits, and some chest tightness/wheezing.  Patient has tried over-the-counter allergy medicine without relief.  Patient notes eyes are draining and also feels like he has mucus draining the back of his throat.  Patient also notes that symptoms are worse at work where it is dusty or outside.  Patient notes he gets short of breath immediately after coughing for a few seconds and until he catches his breath, and will also have some wheezing similar to that.  Patient does not smoke, denies any fevers malaise or bodyaches, and denies any reflux symptoms.  Patient's symptoms most consistent with allergies although considered asthma.  Asthma less likely as wheezing and shortness of breath short-lived and brought on by coughing and resolves on its own.  Will trial allergy medicines. - Zyrtec 10 mg daily - Flonase 2 sprays each nostril daily - Double up Zyrtec dosing if symptoms not improved in 1 week - Return precautions for dyspnea and wheezing - Follow-up 2 to 4 weeks as needed

## 2022-10-27 NOTE — Progress Notes (Unsigned)
  SUBJECTIVE:   CHIEF COMPLAINT / HPI:   Persistent cough Cough for 2-3 wks  Notes that congestion started last week, he thought it was the pollen. This turned into coughing > coughing spells > wheezing.  -Coughs every time he takes a deep breath, and sometimes gets triggered to cough.  -Notes some chest tightness, flem, and small blood streaking w/ coughing. -Notes some dizziness, chest pain w/ coughing, but no fevers or systemic symptoms -Notes eyes are draining and having a lot of mucous in back on throat -Was trying OTC allergy meds w/o relief -Notes symptoms worse outside/at work where it's dusty -Wheezing short lived and follows cough -Patient doesn't smoke, denies any reflux symptoms, or fevers/body aches or malaise   PERTINENT  PMH / PSH:    Patient Care Team: Cora Collum, DO as PCP - General (Family Medicine) Regan Lemming, MD as PCP - Electrophysiology (Cardiology) Wendall Stade, MD as PCP - Cardiology (Cardiology) OBJECTIVE:  BP 132/87   Pulse 91   Ht 5\' 9"  (1.753 m)   Wt (!) 308 lb 12.8 oz (140.1 kg)   SpO2 97%   BMI 45.60 kg/m  Physical Exam Constitutional:      General: He is not in acute distress.    Appearance: Normal appearance. He is ill-appearing.  HENT:     Nose: Congestion present. No rhinorrhea.     Right Turbinates: Enlarged.     Mouth/Throat:     Mouth: Mucous membranes are moist.     Pharynx: No oropharyngeal exudate (mucous draing down throat) or posterior oropharyngeal erythema.  Pulmonary:     Effort: No respiratory distress.     Breath sounds: Normal breath sounds. No stridor. No wheezing, rhonchi or rales.  Neurological:     Mental Status: He is alert.      ASSESSMENT/PLAN:  Seasonal allergies Assessment & Plan: Patient presents with 2 to 3 weeks of congestion that led to cough/coughing fits, and some chest tightness/wheezing.  Patient has tried over-the-counter allergy medicine without relief.  Patient notes eyes are  draining and also feels like he has mucus draining the back of his throat.  Patient also notes that symptoms are worse at work where it is dusty or outside.  Patient notes he gets short of breath immediately after coughing for a few seconds and until he catches his breath, and will also have some wheezing similar to that.  Patient does not smoke, denies any fevers malaise or bodyaches, and denies any reflux symptoms.  Patient's symptoms most consistent with allergies although considered asthma.  Asthma less likely as wheezing and shortness of breath short-lived and brought on by coughing and resolves on its own.  Will trial allergy medicines. - Zyrtec 10 mg daily - Flonase 2 sprays each nostril daily - Double up Zyrtec dosing if symptoms not improved in 1 week - Return precautions for dyspnea and wheezing - Follow-up 2 to 4 weeks as needed  Orders: -     Fluticasone Propionate; Place 2 sprays into both nostrils daily.  Dispense: 16 g; Refill: 6  Other orders -     Cetirizine HCl; Take 1 tablet (10 mg total) by mouth daily as needed for allergies.  Dispense: 30 tablet; Refill: 11   No follow-ups on file. Bess Kinds, MD 10/27/2022, 10:00 AM PGY-2, [facility] {    This will disappear when note is signed, click to select method of visit    :1}

## 2022-10-27 NOTE — Patient Instructions (Addendum)
It was great to see you! Thank you for allowing me to participate in your care!  Our plans for today:  - It sounds like you are having a bad run of allergies. We will start you on some meds. - Take zyrtec 10 mg daily - Take flonase 2 sprays each nostril daily - If allergies not improved in 1 week, double the dose of zyrtec for a month.  - If not improved in a 1-2 weeks, make follow up appointment  If the wheezing continues to come on/doesn't improve, make follow up appointment.  If having shortness of breath for prolonged periods, with wheezing, seek emergency medical care  Take care and seek immediate care sooner if you develop any concerns.   Dr. Bess Kinds, MD [facility]

## 2022-11-13 ENCOUNTER — Ambulatory Visit (HOSPITAL_COMMUNITY)
Admission: RE | Admit: 2022-11-13 | Discharge: 2022-11-13 | Disposition: A | Discharge status: Home / Self Care | Payer: BC Managed Care – PPO | Source: Ambulatory Visit | Attending: Family Medicine | Admitting: Family Medicine

## 2022-11-13 ENCOUNTER — Ambulatory Visit (INDEPENDENT_AMBULATORY_CARE_PROVIDER_SITE_OTHER): Payer: BC Managed Care – PPO | Admitting: Family Medicine

## 2022-11-13 ENCOUNTER — Encounter: Payer: Self-pay | Admitting: Family Medicine

## 2022-11-13 VITALS — BP 135/89 | HR 81 | Ht 69.0 in | Wt 308.0 lb

## 2022-11-13 DIAGNOSIS — R052 Subacute cough: Secondary | ICD-10-CM

## 2022-11-13 DIAGNOSIS — I159 Secondary hypertension, unspecified: Secondary | ICD-10-CM

## 2022-11-13 DIAGNOSIS — E781 Pure hyperglyceridemia: Secondary | ICD-10-CM | POA: Diagnosis not present

## 2022-11-13 DIAGNOSIS — R5383 Other fatigue: Secondary | ICD-10-CM

## 2022-11-13 DIAGNOSIS — Z23 Encounter for immunization: Secondary | ICD-10-CM | POA: Diagnosis not present

## 2022-11-13 DIAGNOSIS — R042 Hemoptysis: Secondary | ICD-10-CM | POA: Diagnosis present

## 2022-11-13 DIAGNOSIS — J40 Bronchitis, not specified as acute or chronic: Secondary | ICD-10-CM

## 2022-11-13 DIAGNOSIS — I10 Essential (primary) hypertension: Secondary | ICD-10-CM | POA: Diagnosis not present

## 2022-11-13 DIAGNOSIS — E119 Type 2 diabetes mellitus without complications: Secondary | ICD-10-CM

## 2022-11-13 LAB — POCT GLYCOSYLATED HEMOGLOBIN (HGB A1C): HbA1c, POC (controlled diabetic range): 7.2 % — AB (ref 0.0–7.0)

## 2022-11-13 NOTE — Patient Instructions (Addendum)
It was great seeing you today!  For your cough it is likely bronchitis and should resolve on its own but we will get a chest x ray as well. We will also check your blood work and I will call you if anything is abnormal after everything results  Your A1c went down to 7.2 which is good news! Continue your Metformin twice a day.   Feel free to call with any questions or concerns at any time, at [phone].   Take care,  Dr. Cora Collum [facility]

## 2022-11-13 NOTE — Progress Notes (Signed)
    SUBJECTIVE:   CHIEF COMPLAINT / HPI:   Patient presents for diabetes follow up  Perisistent cough- was treated for allergies on 5/31 with Zyrtec, Flonase Patient feels its his bronchitis. States sometimes he coughing up clear mucous and sometimes its bloody. Has been going on for about 5 weeks. No fever. Denies CP but does have discomfort during coughing. Has coughing fits about once or  twice a day. Feels more fatigued.    Currently on metformin 500 mg twice daily, not on weekly injection because of insurance.  States fasting sugars varies, typically under 100 or low 100s  States he drinks water and urinates a lot which he thinks is from his BP medication   HTN: Brought in log and ranges from 114/70 - 130/78   PERTINENT  PMH / PSH: Reviewed   OBJECTIVE:   BP 135/89   Pulse 81   Ht 5\' 9"  (1.753 m)   Wt (!) 308 lb (139.7 kg)   SpO2 96%   BMI 45.48 kg/m     Physical exam General: well appearing, NAD Cardiovascular: RRR, no murmurs Lungs: CTAB. Normal WOB Abdomen: soft, non-distended, non-tender Skin: warm, dry. No edema  ASSESSMENT/PLAN:   No problem-specific Assessment & Plan notes found for this encounter.   DM2 Last A1c 8.2 three months ago.   HTN Currently on losartan 100 mg daily, metoprolol 25 mg twice daily, verapamil to 40 mg daily, clonidine .1mg  BID Urine ACR   Cora Collum, DO [facility]

## 2022-11-14 DIAGNOSIS — R059 Cough, unspecified: Secondary | ICD-10-CM | POA: Insufficient documentation

## 2022-11-14 LAB — LIPID PANEL
Chol/HDL Ratio: 4.9 ratio (ref 0.0–5.0)
Cholesterol, Total: 162 mg/dL (ref 100–199)
HDL: 33 mg/dL — ABNORMAL LOW (ref >39)
LDL Chol Calc (NIH): 89 mg/dL (ref 0–99)
Triglycerides: 238 mg/dL — ABNORMAL HIGH (ref 0–149)
VLDL Cholesterol Cal: 40 mg/dL (ref 5–40)

## 2022-11-14 LAB — BASIC METABOLIC PANEL
BUN/Creatinine Ratio: 17 (ref 9–20)
BUN: 25 mg/dL — ABNORMAL HIGH (ref 6–24)
CO2: 21 mmol/L (ref 20–29)
Calcium: 10.2 mg/dL (ref 8.7–10.2)
Chloride: 104 mmol/L (ref 96–106)
Creatinine, Ser: 1.46 mg/dL — ABNORMAL HIGH (ref 0.76–1.27)
Glucose: 116 mg/dL — ABNORMAL HIGH (ref 70–99)
Potassium: 4.2 mmol/L (ref 3.5–5.2)
Sodium: 140 mmol/L (ref 134–144)
eGFR: 58 mL/min/{1.73_m2} — ABNORMAL LOW (ref >59)

## 2022-11-14 LAB — CBC
Hematocrit: 38.9 % (ref 37.5–51.0)
Hemoglobin: 12.8 g/dL — ABNORMAL LOW (ref 13.0–17.7)
MCH: 29.6 pg (ref 26.6–33.0)
MCHC: 32.9 g/dL (ref 31.5–35.7)
MCV: 90 fL (ref 79–97)
Platelets: 232 10*3/uL (ref 150–450)
RBC: 4.32 x10E6/uL (ref 4.14–5.80)
RDW: 13 % (ref 11.6–15.4)
WBC: 6.7 10*3/uL (ref 3.4–10.8)

## 2022-11-14 LAB — IRON,TIBC AND FERRITIN PANEL
Ferritin: 521 ng/mL — ABNORMAL HIGH (ref 30–400)
Iron Saturation: 19 % (ref 15–55)
Iron: 87 ug/dL (ref 38–169)
Total Iron Binding Capacity: 458 ug/dL — ABNORMAL HIGH (ref 250–450)
UIBC: 371 ug/dL — ABNORMAL HIGH (ref 111–343)

## 2022-11-14 LAB — MICROALBUMIN / CREATININE URINE RATIO
Creatinine, Urine: 134.2 mg/dL
Microalb/Creat Ratio: <2 mg/g creat (ref 0–29)
Microalbumin, Urine: <3 ug/mL

## 2022-11-14 LAB — TSH RFX ON ABNORMAL TO FREE T4: TSH: 1.79 u[IU]/mL (ref 0.450–4.500)

## 2022-11-14 NOTE — Assessment & Plan Note (Signed)
Ongoing for about 5 weeks. Other sick symptoms have resolved other than some fatigue. Given he has had some hemoptysis will obtain CXR. Vitals, O2 sat, and physical exam reassuring. He has also never smoked. Discussed supportive care and return precautions - CXR  - BMP CBC, iron panel

## 2022-11-14 NOTE — Assessment & Plan Note (Signed)
A1c 7.2 down from 8.2 three months ago. Currently on Metformin 500mg  BID.  From phone notes it looks like insurance will cover GLP-1 but he has a copay through his insurance. Recommended contacting his insurance about this.  - Continue Metformin and lifestyle changes

## 2022-11-14 NOTE — Assessment & Plan Note (Signed)
BP mildly elevated today but home log shows he has been normotensive. Currently on losartan 100 mg daily, metoprolol 25 mg twice daily, verapamil to 40 mg daily, clonidine .1mg  BID. Will continue current regimen for now - Urine ACR

## 2022-11-16 ENCOUNTER — Encounter: Payer: Self-pay | Admitting: Family Medicine

## 2022-11-20 ENCOUNTER — Other Ambulatory Visit: Payer: Self-pay | Admitting: Family Medicine

## 2022-11-23 ENCOUNTER — Ambulatory Visit (INDEPENDENT_AMBULATORY_CARE_PROVIDER_SITE_OTHER): Payer: BC Managed Care – PPO | Admitting: Family Medicine

## 2022-11-23 ENCOUNTER — Other Ambulatory Visit: Payer: Self-pay

## 2022-11-23 ENCOUNTER — Encounter: Payer: Self-pay | Admitting: Family Medicine

## 2022-11-23 VITALS — BP 111/77 | HR 104 | Ht 69.0 in | Wt 307.6 lb

## 2022-11-23 DIAGNOSIS — N289 Disorder of kidney and ureter, unspecified: Secondary | ICD-10-CM | POA: Diagnosis not present

## 2022-11-23 DIAGNOSIS — E781 Pure hyperglyceridemia: Secondary | ICD-10-CM | POA: Diagnosis not present

## 2022-11-23 NOTE — Assessment & Plan Note (Signed)
Patient had a bump in his kidney function at last visit with a creatinine of 1.46 up from 1.23.  Urine ACR normal. Likely in the setting of his recent infection and with poor oral intake.  He has been feeling better, and is staying well-hydrated.  Will repeat BMP today.

## 2022-11-23 NOTE — Progress Notes (Signed)
    SUBJECTIVE:   CHIEF COMPLAINT / HPI:   Presents for follow-up from his last visit when he had likely a viral infection but had a bump in his kidney function with a creatinine of 1.46 up from 1.23.  Urine ACR normal  Has recovered from recent infection but still with intermittent cough   HLD- Fasting triglycerides 238 on rosuvastatin 40mg  daily,  fenofibrate 145mg  daily   PERTINENT  PMH / PSH: Reviewed   OBJECTIVE:   BP 111/77   Pulse (!) 104   Ht 5\' 9"  (1.753 m)   Wt (!) 307 lb 9.6 oz (139.5 kg)   SpO2 93%   BMI 45.42 kg/m   Physical exam General: well appearing, NAD Cardiovascular: RRR, no murmurs Lungs: CTAB. Normal WOB Abdomen: soft, non-distended, non-tender Skin: warm, dry. No edema  ASSESSMENT/PLAN:   Renal insufficiency Patient had a bump in his kidney function at last visit with a creatinine of 1.46 up from 1.23.  Urine ACR normal. Likely in the setting of his recent infection and with poor oral intake.  He has been feeling better, and is staying well-hydrated.  Will repeat BMP today.   Hypertriglyceridemia Fasting triglycerides 238 and continues on rosuvastatin 40 mg daily and fenofibrate 145 mg daily.  Given no improvement on these medications will refer to lipid clinic for further evaluation and treatment.   Vascepa?  Cora Collum, DO [facility]

## 2022-11-23 NOTE — Assessment & Plan Note (Signed)
Fasting triglycerides 238 and continues on rosuvastatin 40 mg daily and fenofibrate 145 mg daily.  Given no improvement on these medications will refer to lipid clinic for further evaluation and treatment.

## 2022-11-23 NOTE — Patient Instructions (Signed)
It was great seeing you today!  I have referred you to our lipid clinic for management of your elevated triglycerides. They will call you to schedule that appointment in the next couple of weeks.  We are also repeating your blood work to check your kidney function I will call if anything is abnormal or we will send a MyChart message if normal  Feel free to call with any questions or concerns at any time, at [phone].   Take care,  Dr. Cora Collum [facility]

## 2022-11-24 LAB — BASIC METABOLIC PANEL
BUN/Creatinine Ratio: 17 (ref 9–20)
BUN: 24 mg/dL (ref 6–24)
CO2: 19 mmol/L — ABNORMAL LOW (ref 20–29)
Calcium: 10.2 mg/dL (ref 8.7–10.2)
Chloride: 106 mmol/L (ref 96–106)
Creatinine, Ser: 1.41 mg/dL — ABNORMAL HIGH (ref 0.76–1.27)
Glucose: 116 mg/dL — ABNORMAL HIGH (ref 70–99)
Potassium: 4 mmol/L (ref 3.5–5.2)
Sodium: 144 mmol/L (ref 134–144)
eGFR: 60 mL/min/{1.73_m2} (ref >59)

## 2022-12-06 NOTE — Progress Notes (Signed)
I discussed the plan of care with the resident physician and agree with below documentation.  Xena Propst, MD   

## 2022-12-18 ENCOUNTER — Other Ambulatory Visit: Payer: Self-pay

## 2022-12-18 DIAGNOSIS — G43909 Migraine, unspecified, not intractable, without status migrainosus: Secondary | ICD-10-CM

## 2022-12-19 MED ORDER — TOPIRAMATE 50 MG PO TABS: 50.0000 mg | ORAL_TABLET | Freq: Two times a day (BID) | ORAL | 3 refills | Status: DC

## 2023-01-01 ENCOUNTER — Ambulatory Visit: Payer: BC Managed Care – PPO

## 2023-01-01 NOTE — Progress Notes (Deleted)
Patient ID: Rodney Lane                 DOB: 04-10-1971                    MRN: [id number]      HPI: Rodney Lane is a 52 y.o. male patient referred to lipid clinic by Dr.Brown. PMH is significant for HTN, CAD, DM, OSA, Angina    Reviewed options for lowering LDL cholesterol, including ezetimibe, PCSK-9 inhibitors, bempedoic acid and inclisiran.  Discussed mechanisms of action, dosing, side effects and potential decreases in LDL cholesterol.  Also reviewed cost information and potential options for patient assistance.  Current Medications: rosuvastatin 40mg  daily,  fenofibrate 145mg  daily  Intolerances:  Risk Factors: Premature CAD, HTN, DM, OSA,angina  LDL goal: <55 mg/dl  Last lab: TC 528, TG 413, HDL 33, LDLc 89   Diet:   Exercise:   Family History:   Social History:   Labs: Lipid Panel     Component Value Date/Time   CHOL 162 11/13/2022 1718   TRIG 238 (H) 11/13/2022 1718   HDL 33 (L) 11/13/2022 1718   CHOLHDL 4.9 11/13/2022 1718   CHOLHDL 4.6 06/28/2020 1417   VLDL UNABLE TO CALCULATE IF TRIGLYCERIDE OVER 400 mg/dL 24/40/1027 2536   LDLCALC 89 11/13/2022 1718   LDLDIRECT 90 12/13/2020 1222   LDLDIRECT 70.6 06/28/2020 1417   LABVLDL 40 11/13/2022 1718    Past Medical History:  Diagnosis Date   Allergy    thru the year    Anxiety    Blurry vision, bilateral 08/26/2019   Coronary artery calcification    Diabetes mellitus (HCC)    borderline- no meds    Dilatation of aorta (HCC)    GERD (gastroesophageal reflux disease)    past hx- not current    Hepatic steatosis    HTN (hypertension)    Hypertriglyceridemia    Inappropriate sinus tachycardia    Morbid obesity (HCC)    Sleep apnea    wears cpap nightly     Current Outpatient Medications on File Prior to Visit  Medication Sig Dispense Refill   ASPIRIN LOW DOSE 81 MG tablet TAKE 1 TABLET (81 MG TOTAL) BY MOUTH DAILY. SWALLOW WHOLE. 30 tablet 11   cetirizine (ZYRTEC) 10 MG tablet Take 1 tablet (10 mg total)  by mouth daily as needed for allergies. 30 tablet 11   cloNIDine (CATAPRES) 0.1 MG tablet Take 1 tablet (0.1 mg total) by mouth 2 (two) times daily. 180 tablet 3   fenofibrate (TRICOR) 145 MG tablet TAKE 1 TABLET BY MOUTH EVERY DAY 30 tablet 2   fluticasone (FLONASE) 50 MCG/ACT nasal spray Place 2 sprays into both nostrils daily. 16 g 6   gabapentin (NEURONTIN) 300 MG capsule Take 1 capsule (300 mg total) by mouth at bedtime. 90 capsule 1   losartan (COZAAR) 100 MG tablet TAKE 1 TABLET BY MOUTH EVERY DAY 30 tablet 5   metFORMIN (GLUCOPHAGE-XR) 500 MG 24 hr tablet Take 1 tablet (500 mg total) by mouth 2 (two) times daily with a meal. 180 tablet 3   metoprolol tartrate (LOPRESSOR) 25 MG tablet TAKE 1 TABLET BY MOUTH TWICE A DAY 60 tablet 1   rizatriptan (MAXALT-MLT) 10 MG disintegrating tablet TAKE 1 TABLET BY MOUTH AS NEEDED FOR MIGRAINE. MAY REPEAT IN 2 HOURS IF NEEDED 9 tablet 11   rosuvastatin (CRESTOR) 40 MG tablet Take 1 tablet (40 mg total) by mouth daily. 90 tablet 3  Semaglutide,0.25 or 0.5MG /DOS, 2 MG/1.5ML SOPN Inject 0.25 mg into the skin once a week. 0.25 mg once weekly for 4 weeks then increase to 0.5 mg weekly for at least 4 weeks,max 1 mg 1.5 mL 0   sertraline (ZOLOFT) 25 MG tablet Take 25 mg by mouth daily. (Patient not taking: Reported on 10/13/2022)     sildenafil (REVATIO) 20 MG tablet Take 3-5 tablets as needed. 60 tablet 2   tirzepatide (MOUNJARO) 2.5 MG/0.5ML Pen Inject 2.5 mg into the skin once a week. (Patient not taking: Reported on 10/13/2022) 2 mL 0   topiramate (TOPAMAX) 50 MG tablet Take 1 tablet (50 mg total) by mouth 2 (two) times daily. 180 tablet 3   verapamil (CALAN-SR) 240 MG CR tablet TAKE 1 TABLET BY MOUTH EVERYDAY AT BEDTIME 90 tablet 3   No current facility-administered medications on file prior to visit.    No Known Allergies  Assessment/Plan:  1. Hyperlipidemia -  No problems updated. No problem-specific Assessment & Plan notes found for this  encounter.    Thank you,  Carmela Hurt, Pharm.D [facility] A Division of  [facility] [address], [place], [place] [address]  Phone: [phone]; Fax: [phone]

## 2023-01-12 ENCOUNTER — Other Ambulatory Visit: Payer: Self-pay

## 2023-01-12 DIAGNOSIS — E781 Pure hyperglyceridemia: Secondary | ICD-10-CM

## 2023-01-15 MED ORDER — FENOFIBRATE 145 MG PO TABS: 145.0000 mg | ORAL_TABLET | Freq: Every day | ORAL | 3 refills | Status: DC

## 2023-01-18 ENCOUNTER — Encounter: Payer: Self-pay | Admitting: Family Medicine

## 2023-01-19 NOTE — Progress Notes (Unsigned)
Office Visit    Patient Name: Rodney Lane Date of Encounter: 01/22/2023  Primary Care Provider:  [name], MD Primary Cardiologist:  Rodney Haws, MD  Chief Complaint    Hyperlipidemia/hypertriglyceridemia  Significant Past Medical History   HTN Controlled at last visit, on losartan, clonidine, metoprolol  OSA On CPAP  DM2 6/24 A1c 7.2 on metformin, tirzepatide  CKD Stage 3 - GFR 58  tachycardia On metoprolol, verapamil, followed by Rodney. Girard Lane  migraines 1-2 per month, uses rizatriptan ODT     No Known Allergies  History of Present Illness    Rodney Lane is a 52 y.o. male patient of Rodney Lane, in the office today to discuss options for cholesterol management. Notes lots of stress in his life and has seen a decrease in appetite recently because of this.  He was prescribed Ozempic, but has a high deductible insurance plan and even with the co-pay card the cost came out to several hundred dollars per month.    Insurance Carrier: [employer]  LDL Cholesterol goal:  LDL < 100  Current Medications:  rosuvastatin 40, fenofibrate 145  Previously tried: Lovaza, Vascepa - caused migraines  Family Hx: pgf with heart disease, father died cancer, mother living, had brain tumor, now 60, doing well; no siblings, no children  Social Hx: Tobacco: never Alcohol: none   Diet:  mix of home and eating out; has cut back on fast foods, not much beef, more chicken, occasional fish; vegetables usually frozen; watches salt intake closely  Exercise: just walking at work 10-12 hrs/day 4-5 days per week  Accessory Clinical Findings   Lab Results  Component Value Date   CHOL 162 11/13/2022   HDL 33 (L) 11/13/2022   LDLCALC 89 11/13/2022   LDLDIRECT 90 12/13/2020   TRIG 238 (H) 11/13/2022   CHOLHDL 4.9 11/13/2022    No results found for: "LIPOA"  Lab Results  Component Value Date   ALT 45 (H) 02/28/2021   AST 32 02/28/2021   ALKPHOS 39 02/28/2021   BILITOT 0.3 02/28/2021    Lab Results  Component Value Date   CREATININE 1.41 (H) 11/23/2022   BUN 24 11/23/2022   NA 144 11/23/2022   K 4.0 11/23/2022   CL 106 11/23/2022   CO2 19 (L) 11/23/2022   Lab Results  Component Value Date   HGBA1C 7.2 (A) 11/13/2022    Home Medications    Current Outpatient Medications  Medication Sig Dispense Refill   ASPIRIN LOW DOSE 81 MG tablet TAKE 1 TABLET (81 MG TOTAL) BY MOUTH DAILY. SWALLOW WHOLE. 30 tablet 11   cetirizine (ZYRTEC) 10 MG tablet Take 1 tablet (10 mg total) by mouth daily as needed for allergies. 30 tablet 11   cloNIDine (CATAPRES) 0.1 MG tablet Take 1 tablet (0.1 mg total) by mouth 2 (two) times daily. 180 tablet 3   fenofibrate (TRICOR) 145 MG tablet Take 1 tablet (145 mg total) by mouth daily. 90 tablet 3   fluticasone (FLONASE) 50 MCG/ACT nasal spray Place 2 sprays into both nostrils daily. 16 g 6   gabapentin (NEURONTIN) 300 MG capsule Take 1 capsule (300 mg total) by mouth at bedtime. 90 capsule 1   losartan (COZAAR) 100 MG tablet TAKE 1 TABLET BY MOUTH EVERY DAY 30 tablet 5   metFORMIN (GLUCOPHAGE-XR) 500 MG 24 hr tablet Take 1 tablet (500 mg total) by mouth 2 (two) times daily with a meal. 180 tablet 3   metoprolol tartrate (LOPRESSOR) 25 MG tablet TAKE  1 TABLET BY MOUTH TWICE A DAY 60 tablet 1   rizatriptan (MAXALT-MLT) 10 MG disintegrating tablet TAKE 1 TABLET BY MOUTH AS NEEDED FOR MIGRAINE. MAY REPEAT IN 2 HOURS IF NEEDED 9 tablet 11   rosuvastatin (CRESTOR) 40 MG tablet Take 1 tablet (40 mg total) by mouth daily. 90 tablet 3   Semaglutide,0.25 or 0.5MG /DOS, 2 MG/1.5ML SOPN Inject 0.25 mg into the skin once a week. 0.25 mg once weekly for 4 weeks then increase to 0.5 mg weekly for at least 4 weeks,max 1 mg 1.5 mL 0   sertraline (ZOLOFT) 25 MG tablet Take 25 mg by mouth daily. (Patient not taking: Reported on 10/13/2022)     sildenafil (REVATIO) 20 MG tablet Take 3-5 tablets as needed. 60 tablet 2   tirzepatide (MOUNJARO) 2.5 MG/0.5ML Pen Inject  2.5 mg into the skin once a week. (Patient not taking: Reported on 10/13/2022) 2 mL 0   topiramate (TOPAMAX) 50 MG tablet Take 1 tablet (50 mg total) by mouth 2 (two) times daily. 180 tablet 3   verapamil (CALAN-SR) 240 MG CR tablet TAKE 1 TABLET BY MOUTH EVERYDAY AT BEDTIME 90 tablet 3   No current facility-administered medications for this visit.     Assessment & Plan    Hypertriglyceridemia Assessment: Patient with hypertriglyceridemia and not at goal of < 150 Most recent triglycerides 238 on 10/2022 Has been compliant with fenofibrate 145 mg daily Was previously tried on Lovaza and Vascepa, but they both triggered migraines Is able to take 1 capsule OTC fish oil without problem  Plan: Patient agreeable to increasing OTC fish oil to 2 capsules twice daily.  Will look to find OTC product with [employer]P label for best results Work on dietary changes to improve triglycerides Move fenofibrate to meal time for better absorption Repeat labs after:  3 months Lipid Liver function   Rodney Lane, PharmD CPP [facility] [address]  [place], [place] [address] [phone]  01/22/2023, 9:51 AM

## 2023-01-22 ENCOUNTER — Encounter: Payer: Self-pay | Admitting: Pharmacist Clinician (PhC)/ Clinical Pharmacy Specialist

## 2023-01-22 ENCOUNTER — Ambulatory Visit
Payer: BC Managed Care – PPO | Attending: Cardiology | Admitting: Pharmacist Clinician (PhC)/ Clinical Pharmacy Specialist

## 2023-01-22 DIAGNOSIS — E781 Pure hyperglyceridemia: Secondary | ICD-10-CM | POA: Diagnosis not present

## 2023-01-22 NOTE — Assessment & Plan Note (Addendum)
Assessment: Patient with hypertriglyceridemia and not at goal of < 150 Most recent triglycerides 238 on 10/2022 Has been compliant with fenofibrate 145 mg daily Was previously tried on Lovaza and Vascepa, but they both triggered migraines Is able to take 1 capsule OTC fish oil without problem  Plan: Patient agreeable to increasing OTC fish oil to 2 capsules twice daily.  Will look to find OTC product with USP label for best results Work on dietary changes to improve triglycerides Move fenofibrate to meal time for better absorption Repeat labs after:  3 months Lipid Liver function

## 2023-01-22 NOTE — Patient Instructions (Signed)
Your Results:             Your most recent labs Goal  Total Cholesterol 162 < 200  Triglycerides 238 < 150  HDL (happy/good cholesterol) 33 > 40  LDL (lousy/bad cholesterol 89 < 100   Medication changes:  Increase your dose of fish oil to 2 capsules twice daily.  (Keep in refrigerator to avoid fishy aftertaste).  Make sure the bottle has a [employer]P logo to ensure that it is a good product.  Lab orders:  We want to repeat labs after 2-3 months.  We will send you a lab order to remind you once we get closer to that time.    High Triglycerides Eating Plan Triglycerides are a type of fat in the blood. High levels of triglycerides can increase your risk of heart disease and stroke. If your triglyceride levels are high, choosing the right foods can help lower your triglycerides and keep your heart healthy. Work with your health care provider or a dietitian to develop an eating plan that is right for you. What are tips for following this plan? General guidelines  Lose weight, if you are overweight. For most people, losing 5-10 lb (2-5 kg) helps lower triglyceride levels. A weight-loss plan may include: 30 minutes of exercise at least 5 days a week. Reducing the amount of calories, sugar, and fat you eat. Eat a wide variety of fresh fruits, vegetables, and whole grains. These foods are high in fiber. Eat foods that contain healthy fats, such as fatty fish, nuts, seeds, and olive oil. Avoid foods that are high in added sugar, added salt (sodium), and saturated fat. Avoid low-fiber, refined carbohydrates such as white bread, crackers, noodles, and white rice. Avoid foods with trans fats or partially hydrogenated oils, such as fried foods or stick margarine. If you drink alcohol: Limit how much you have to: 0-1 drink a day for women who are not pregnant. 0-2 drinks a day for men. Your health care provider may recommend that you drink less than these amounts depending on your overall health. Know how  much alcohol is in a drink. In the [place] one drink equals one 12 oz bottle of beer (355 mL), one 5 oz glass of wine (148 mL), or one 1 oz glass of hard liquor (44 mL). Reading food labels Check food labels for: The amount of saturated fat. Choose foods with no or very little saturated fat (less than 2 g). The amount of trans fat. Choose foods with no transfat. The amount of cholesterol. Choose foods that are low in cholesterol. The amount of sodium. Choose foods with less than 140 milligrams (mg) per serving. Shopping Buy dairy products labeled as nonfat (skim) or low-fat (1%). Avoid buying processed or prepackaged foods. These are often high in added sugar, sodium, and fat. Cooking Choose healthy fats when cooking, such as olive oil, avocado oil, or canola oil. Cook foods using lower fat methods, such as baking, broiling, boiling, or grilling. Make your own sauces, dressings, and marinades when possible, instead of buying them. Store-bought sauces, dressings, and marinades are often high in sodium and sugar. Meal planning Eat more home-cooked food and less restaurant, buffet, and fast food. Eat fatty fish at least 2 times each week. Examples of fatty fish include salmon, trout, sardines, mackerel, tuna, and herring. If you eat whole eggs, do not eat more than 4 egg yolks per week. What foods should I eat? Fruits All fresh, canned (in natural juice), or frozen fruits.  Vegetables Fresh or frozen vegetables. Low-sodium canned vegetables. Grains Whole wheat or whole grain breads, crackers, cereals, and pasta. Unsweetened oatmeal. Bulgur. Barley. Quinoa. Brown rice. Whole wheat flour tortillas. Meats and other proteins Skinless chicken or [place]. Ground chicken or [place]. Lean cuts of pork, trimmed of fat. Fish and seafood, especially salmon, trout, and herring. Egg whites. Dried beans, peas, or lentils. Unsalted nuts or seeds. Unsalted canned beans. Natural peanut or almond butter or other  nut butters. Dairy Low-fat dairy products. Skim or low-fat (1%) milk. Reduced fat (2%) and low-sodium cheese. Low-fat ricotta cheese. Low-fat cottage cheese. Plain, low-fat yogurt. Fats and oils Tub margarine without trans fats. Light or reduced-fat mayonnaise. Light or reduced-fat salad dressings. Avocado. Safflower, olive, sunflower, soybean, and canola oils. The items listed above may not be a complete list of recommended foods and beverages. Talk with your dietitian about what dietary choices are best for you. What foods should I avoid? Fruits Sweetened dried fruit. Canned fruit in syrup. Fruit juice. Vegetables Creamed or fried vegetables. Vegetables in a cheese sauce. Grains White bread. White (regular) pasta. White rice. Cornbread. Bagels. Pastries. Crackers that contain trans fat. Meats and other proteins Fatty cuts of meat. Ribs. Chicken wings. Tomasa Blase. Sausage. Bologna. Salami. Chitterlings. Fatback. Hot dogs. Bratwurst. Packaged lunch meats. Dairy Whole or reduced-fat (2%) milk. Half-and-half. Cream cheese. Full-fat or sweetened yogurt. Full-fat cheese. Nondairy creamers. Whipped toppings. Processed cheese or cheese spreads. Cheese curds. Fats and oils Butter. Stick margarine. Lard. Shortening. Ghee. Bacon fat. Tropical oils, such as coconut, palm kernel, or palm oils. Beverages Alcohol. Sweetened drinks, such as soda, lemonade, fruit drinks, or punches. Sweets and desserts Corn syrup. Sugars. Honey. Molasses. Candy. Jam and jelly. Syrup. Sweetened cereals. Cookies. Pies. Cakes. Donuts. Muffins. Ice cream. Condiments Store-bought sauces, dressings, and marinades that are high in sugar, such as ketchup and barbecue sauce. The items listed above may not be a complete list of foods and beverages you should avoid. Talk with your dietitian about what dietary choices are best for you. Summary High levels of triglycerides can increase the risk of heart disease and stroke. Choosing the  right foods can help lower your triglycerides. Eat plenty of fresh fruits, vegetables, and whole grains. Choose low-fat dairy and lean meats. Eat fatty fish at least twice a week. Avoid processed and prepackaged foods with added sugar, sodium, saturated fat, and trans fat. If you need suggestions or have questions about what types of food are good for you, talk with your health care provider or a dietitian. This information is not intended to replace advice given to you by your health care provider. Make sure you discuss any questions you have with your health care provider. Document Revised: 09/24/2020 Document Reviewed: 09/24/2020 Elsevier Patient Education  2024 [employer].  Thank you for choosing [facility] [employer]

## 2023-01-23 ENCOUNTER — Other Ambulatory Visit: Payer: Self-pay | Admitting: Cardiovascular Disease

## 2023-02-15 ENCOUNTER — Other Ambulatory Visit: Payer: Self-pay | Admitting: Cardiovascular Disease

## 2023-02-20 ENCOUNTER — Other Ambulatory Visit: Payer: Self-pay | Admitting: *Deleted

## 2023-02-22 ENCOUNTER — Other Ambulatory Visit: Payer: Self-pay

## 2023-02-23 MED ORDER — ASPIRIN 81 MG PO TBEC: 81.0000 mg | DELAYED_RELEASE_TABLET | Freq: Every day | ORAL | 3 refills | Status: DC

## 2023-03-22 ENCOUNTER — Other Ambulatory Visit: Payer: Self-pay

## 2023-03-26 MED ORDER — METOPROLOL TARTRATE 25 MG PO TABS: 25.0000 mg | ORAL_TABLET | Freq: Two times a day (BID) | ORAL | 1 refills | Status: DC

## 2023-04-09 ENCOUNTER — Other Ambulatory Visit: Payer: Self-pay

## 2023-04-09 MED ORDER — GABAPENTIN 300 MG PO CAPS: 300.0000 mg | ORAL_CAPSULE | Freq: Every day | ORAL | 1 refills | Status: DC

## 2023-04-11 ENCOUNTER — Other Ambulatory Visit: Payer: Self-pay | Admitting: Cardiovascular Disease

## 2023-04-11 ENCOUNTER — Encounter: Payer: Self-pay | Admitting: Family Medicine

## 2023-04-11 MED ORDER — GABAPENTIN 300 MG PO CAPS: 300.0000 mg | ORAL_CAPSULE | Freq: Every day | ORAL | 1 refills | Status: DC

## 2023-04-11 NOTE — Telephone Encounter (Signed)
Verified last fill 01-09-2023 #[id number]. Prescription was done 04/09/2023, but was printed.  Please redo.

## 2023-04-19 ENCOUNTER — Other Ambulatory Visit: Payer: Self-pay | Admitting: Family Medicine

## 2023-05-04 ENCOUNTER — Other Ambulatory Visit: Payer: Self-pay | Admitting: Cardiovascular Disease

## 2023-05-04 ENCOUNTER — Telehealth: Payer: Self-pay | Admitting: Pharmacist Clinician (PhC)/ Clinical Pharmacy Specialist

## 2023-05-04 DIAGNOSIS — E781 Pure hyperglyceridemia: Secondary | ICD-10-CM

## 2023-05-04 NOTE — Telephone Encounter (Signed)
Labs ordered for trigs, patient notified

## 2023-05-15 ENCOUNTER — Other Ambulatory Visit (HOSPITAL_COMMUNITY): Payer: Self-pay

## 2023-05-15 ENCOUNTER — Encounter: Payer: Self-pay | Admitting: Family Medicine

## 2023-05-15 ENCOUNTER — Ambulatory Visit (INDEPENDENT_AMBULATORY_CARE_PROVIDER_SITE_OTHER): Payer: BC Managed Care – PPO | Admitting: Family Medicine

## 2023-05-15 VITALS — BP 119/87 | HR 74 | Ht 69.0 in | Wt 299.4 lb

## 2023-05-15 DIAGNOSIS — N289 Disorder of kidney and ureter, unspecified: Secondary | ICD-10-CM | POA: Diagnosis not present

## 2023-05-15 DIAGNOSIS — Z7984 Long term (current) use of oral hypoglycemic drugs: Secondary | ICD-10-CM

## 2023-05-15 DIAGNOSIS — M5416 Radiculopathy, lumbar region: Secondary | ICD-10-CM | POA: Diagnosis not present

## 2023-05-15 DIAGNOSIS — E119 Type 2 diabetes mellitus without complications: Secondary | ICD-10-CM | POA: Diagnosis not present

## 2023-05-15 LAB — POCT GLYCOSYLATED HEMOGLOBIN (HGB A1C): HbA1c, POC (controlled diabetic range): 6.7 % (ref 0.0–7.0)

## 2023-05-15 MED ORDER — GABAPENTIN 300 MG PO CAPS: 300.0000 mg | ORAL_CAPSULE | Freq: Two times a day (BID) | ORAL | Status: DC

## 2023-05-15 MED ORDER — TIRZEPATIDE 2.5 MG/0.5ML ~~LOC~~ SOAJ: 2.5000 mg | SUBCUTANEOUS | 1 refills | Status: DC

## 2023-05-15 NOTE — Progress Notes (Signed)
    SUBJECTIVE:   CHIEF COMPLAINT / HPI:   Back Pain This is a chronic problem. Episode onset: Right lower back pain radiating to his groin and thight and sometimes lower leg. Ongoing for many months. The problem has been waxing and waning since onset. The pain is present in the lumbar spine (Right lower back pain). The quality of the pain is described as burning and shooting (Feels like pins and needle sensation). Radiates to: Radiates to the groin and thigh. The pain is at a severity of 8/10. The pain is moderate. The pain is The same all the time. The symptoms are aggravated by standing and sitting (He stands for a prolonged period at work and also lift heavy objects). Associated symptoms include numbness, paresthesias and tingling. Pertinent negatives include no bladder incontinence, bowel incontinence, leg pain or weakness. (Feels numb in his thigh) Risk factors include obesity (He injured his back many years ago (1998) when he was in the [employer]. He slipped on a metal ramp falling straight down on his head and back). He has tried NSAIDs, bed rest and analgesics (dvil, Aleeve, tylenol, Aspercreme, Biofreeze) for the symptoms. The treatment provided moderate relief.   DM2/Renal impairment: Here for f/u. He is compliant with all his meds.   PERTINENT  PMH / PSH: PMHx reviewed  OBJECTIVE:   BP 119/87   Pulse 74   Ht 5\' 9"  (1.753 m)   Wt 299 lb 6 oz (135.8 kg)   SpO2 98%   BMI 44.21 kg/m   Physical Exam Vitals and nursing note reviewed.  Cardiovascular:     Rate and Rhythm: Normal rate and regular rhythm.     Heart sounds: Normal heart sounds. No murmur heard. Pulmonary:     Effort: Pulmonary effort is normal. No respiratory distress.     Breath sounds: Normal breath sounds.  Musculoskeletal:     Lumbar back: Tenderness present. Decreased range of motion. Positive right straight leg raise test. Negative left straight leg raise test.  Neurological:     General: No focal deficit  present.     Cranial Nerves: Cranial nerves 2-12 are intact.     Sensory: Sensation is intact.     Motor: Motor function is intact.     Coordination: Coordination is intact.     Deep Tendon Reflexes: Reflexes are normal and symmetric.      ASSESSMENT/PLAN:   Lumbar radiculopathy Lumbar MRI from 2023 reviewed - Degenerative disc disease of L4-L5 mild bilateral foraminal and left subarticular recess stenosis. No canal stenosis at any level. No neurologic deficit on exam I discussed going up on his Gabapentin to BID and then TID if able to tolerate his - GFR ok, although elevated Will hold off on adding on NSAID pending repeat Bmet to assess his kidney functions PT and ortho referral discussed and he is interested in both F/U with PCP soon  DM2 (diabetes mellitus, type 2) (HCC) A1C improved to 6.7 today Continue current dose of Metformin 500 mg BID He is interested in GLP1 for additional control, weight loss benefit and renal benefit Mounjaro discussed He denies FHx or personal hx of medullary thyroid cancer Mounjaro escribed  Renal insufficiency Renal function elevated 6 months ago No GU symptoms Bmet rechecked today I will contact him soon with his results He agreed with the plan     Janit Pagan, MD [facility]

## 2023-05-15 NOTE — Patient Instructions (Signed)
Tirzepatide Injection What is this medication? TIRZEPATIDE (tir ZEP a tide) treats type 2 diabetes. It works by increasing insulin levels in your body, which decreases your blood sugar (glucose). It also reduces the amount of sugar released into your blood and slows down your digestion. Changes to diet and exercise are often combined with this medication. This medicine may be used for other purposes; ask your health care provider or pharmacist if you have questions. COMMON BRAND NAME(S): MOUNJARO What should I tell my care team before I take this medication? They need to know if you have any of these conditions: Endocrine tumors (MEN 2) or if someone in your family had these tumors Eye disease, vision problems Gallbladder disease History of pancreatitis Kidney disease Stomach or intestine problems Thyroid cancer or if someone in your family had thyroid cancer An unusual or allergic reaction to tirzepatide, other medications, foods, dyes, or preservatives Pregnant or trying to get pregnant Breast-feeding How should I use this medication? This medication is injected under the skin. You will be taught how to prepare and give it. It is given once every week (every 7 days). Keep taking it unless your health care provider tells you to stop. If you use this medication with insulin, you should inject this medication and the insulin separately. Do not mix them together. Do not give the injections right next to each other. Change (rotate) injection sites with each injection. This medication comes with INSTRUCTIONS FOR USE. Ask your pharmacist for directions on how to use this medication. Read the information carefully. Talk to your pharmacist or care team if you have questions. It is important that you put your used needles and syringes in a special sharps container. Do not put them in a trash can. If you do not have a sharps container, call your pharmacist or care team to get one. A special MedGuide  will be given to you by the pharmacist with each prescription and refill. Be sure to read this information carefully each time. Talk to your care team about the use of this medication in children. Special care may be needed. Overdosage: If you think you have taken too much of this medicine contact a poison control center or emergency room at once. NOTE: This medicine is only for you. Do not share this medicine with others. What if I miss a dose? If you miss a dose, take it as soon as you can unless it is more than 4 days (96 hours) late. If it is more than 4 days late, skip the missed dose. Take the next dose at the normal time. Do not take 2 doses within 3 days of each other. What may interact with this medication? Alcohol Antiviral medications for HIV or AIDS Aspirin and aspirin-like medications Beta blockers, such as atenolol, metoprolol, propranolol Certain medications for blood pressure, heart disease, irregular heart beat Chromium Clonidine Diuretics Estrogen or progestin hormones Fenofibrate Gemfibrozil Guanethidine Isoniazid Lanreotide Male hormones or anabolic steroids MAOIs, such as Marplan, Nardil, and Parnate Medications for weight loss Medications for allergies, asthma, cold, or cough Medications for depression, anxiety, or mental health conditions Niacin Nicotine NSAIDs, medications for pain and inflammation, such as ibuprofen or naproxen Octreotide Other medications for diabetes, such as glyburide, glipizide, or glimepiride Pasireotide Pentamidine Phenytoin Probenecid Quinolone antibiotics, such as ciprofloxacin, levofloxacin, ofloxacin Reserpine Some herbal dietary supplements Steroid medications, such as prednisone or cortisone Sulfamethoxazole; trimethoprim Thyroid hormones Warfarin This list may not describe all possible interactions. Give your health care provider a  list of all the medicines, herbs, non-prescription drugs, or dietary supplements you use.  Also tell them if you smoke, drink alcohol, or use illegal drugs. Some items may interact with your medicine. What should I watch for while using this medication? Visit your care team for regular checks on your progress. You may need blood work done while you are taking this medication. Your care team will monitor your HbA1C (A1C). This test shows what your average blood sugar (glucose) level was over the past 2 to 3 months. Know the symptoms of low blood sugar and know how to treat it. Always carry a source of quick sugar with you. Examples include hard sugar candy or glucose tablets. Make sure others know that you can choke if you eat or drink if your blood sugar is too low and you are unable to care for yourself. Get medical help at once. Tell your care team if you have high blood sugar. Your medication dose may change if your body is under stress. Some types of stress that may affect your blood sugar include fever, infection, and surgery. Check with your care team if you have severe diarrhea, nausea, and vomiting, or if you sweat a lot. The loss of too much body fluid may make it dangerous for you to take this medication. Do not share pens or cartridges with anyone, even if the needle is changed. Each pen should only be used by one person. Sharing could cause an infection. Wear a medical ID bracelet or chain. Carry a card that describes your condition. List the medications and doses you take on the card. Talk to your care team about your risk of cancer. You may be more at risk for certain types of cancer if you take this medication. Estrogen and progestin hormones may not work as well while you are taking this medication. If you take these as pills by mouth, your care team may recommend another type of contraception for 4 weeks after you start this medication and for 4 weeks after each dose increase. Talk to your care team about contraceptive options. They can help you find the option that works for  you. What side effects may I notice from receiving this medication? Side effects that you should report to your care team as soon as possible: Allergic reactions or angioedema--skin rash, itching or hives, swelling of the face, eyes, lips, tongue, arms, or legs, trouble swallowing or breathing Change in vision Dehydration--increased thirst, dry mouth, feeling faint or lightheaded, headache, dark yellow or brown urine Gallbladder problems--severe stomach pain, nausea, vomiting, fever Kidney injury--decrease in the amount of urine, swelling of the ankles, hands, or feet Pancreatitis--severe stomach pain that spreads to your back or gets worse after eating or when touched, fever, nausea, vomiting Thoughts of suicide or self-harm, worsening mood, feelings of depression Thyroid cancer--new mass or lump in the neck, pain or trouble swallowing, trouble breathing, hoarseness Side effects that usually do not require medical attention (report these to your care team if they continue or are bothersome): Diarrhea Loss of appetite Nausea Upset stomach This list may not describe all possible side effects. Call your doctor for medical advice about side effects. You may report side effects to FDA at 1-800-FDA-1088. Where should I keep my medication? Keep out of the reach of children and pets. Refrigeration (preferred): Store in the refrigerator. Keep this medication in the original carton until you are ready to take it. Do not freeze. Protect from light. Get rid of opened vials  after use, even if there is medication left. Get rid of any unopened vials or pens after the expiration date. Room Temperature: This medication may be stored at room temperature below 30 degrees C (86 degrees F) for up to 21 days. Keep this medication in the original carton until you are ready to take it. Protect from light. Avoid exposure to extreme heat. Get rid of opened vials after use, even if there is medication left. Get rid of any  unopened vials or pens after 21 days, or after they expire, whichever is first. To get rid of medications that are no longer needed or have expired: Take the medication to a medication take-back program. Check with your pharmacy or law enforcement to find a location. If you cannot return the medication, ask your pharmacist or care team how to get rid of this medication safely. NOTE: This sheet is a summary. It may not cover all possible information. If you have questions about this medicine, talk to your doctor, pharmacist, or health care provider.  2024 Elsevier/Gold Standard (2022-09-07 00:00:00)

## 2023-05-15 NOTE — Assessment & Plan Note (Signed)
Renal function elevated 6 months ago No GU symptoms Bmet rechecked today I will contact him soon with his results He agreed with the plan

## 2023-05-15 NOTE — Assessment & Plan Note (Signed)
A1C improved to 6.7 today Continue current dose of Metformin 500 mg BID He is interested in GLP1 for additional control, weight loss benefit and renal benefit Mounjaro discussed He denies FHx or personal hx of medullary thyroid cancer Mounjaro escribed

## 2023-05-15 NOTE — Assessment & Plan Note (Addendum)
Lumbar MRI from 2023 reviewed - Degenerative disc disease of L4-L5 mild bilateral foraminal and left subarticular recess stenosis. No canal stenosis at any level. No neurologic deficit on exam I discussed going up on his Gabapentin to BID and then TID if able to tolerate his - GFR ok, although elevated Will hold off on adding on NSAID pending repeat Bmet to assess his kidney functions PT and ortho referral discussed and he is interested in both F/U with PCP soon

## 2023-05-16 ENCOUNTER — Encounter: Payer: Self-pay | Admitting: Family Medicine

## 2023-05-16 ENCOUNTER — Telehealth: Payer: Self-pay | Admitting: Family Medicine

## 2023-05-16 LAB — BASIC METABOLIC PANEL
BUN/Creatinine Ratio: 13 (ref 9–20)
BUN: 22 mg/dL (ref 6–24)
CO2: 21 mmol/L (ref 20–29)
Calcium: 10 mg/dL (ref 8.7–10.2)
Chloride: 106 mmol/L (ref 96–106)
Creatinine, Ser: 1.72 mg/dL — ABNORMAL HIGH (ref 0.76–1.27)
Glucose: 102 mg/dL — ABNORMAL HIGH (ref 70–99)
Potassium: 4.4 mmol/L (ref 3.5–5.2)
Sodium: 142 mmol/L (ref 134–144)
eGFR: 47 mL/min/{1.73_m2} — ABNORMAL LOW (ref >59)

## 2023-05-16 NOTE — Telephone Encounter (Signed)
HIPAA compliant callback message left.  Please advise when he calls -  Kidney function is up and worse than the test done 6 months ago. This occurs in the settings of him being diabetic. He is also on a high dose Losartan as well as Metformin which could affect his kidney functions. F/U with PCP for med adjustment. Need repeat Kidney functions in about 1-2 weeks. He already has PCP appointment scheduled. Greggory Keen is likely to be approved at zero coverage per [place]. Perhaps, he can get off Metformin, once he gets on Mounjaro. F/U as scheduled with PCP for medication management.

## 2023-05-18 ENCOUNTER — Other Ambulatory Visit: Payer: Self-pay | Admitting: Cardiovascular Disease

## 2023-05-19 ENCOUNTER — Other Ambulatory Visit: Payer: Self-pay | Admitting: Student

## 2023-05-19 DIAGNOSIS — J302 Other seasonal allergic rhinitis: Secondary | ICD-10-CM

## 2023-05-25 ENCOUNTER — Other Ambulatory Visit: Payer: Self-pay | Admitting: Cardiovascular Disease

## 2023-06-04 ENCOUNTER — Ambulatory Visit: Payer: BC Managed Care – PPO | Admitting: Family Medicine

## 2023-06-11 ENCOUNTER — Other Ambulatory Visit: Payer: Self-pay | Admitting: Family Medicine

## 2023-06-18 ENCOUNTER — Ambulatory Visit (INDEPENDENT_AMBULATORY_CARE_PROVIDER_SITE_OTHER): Payer: BC Managed Care – PPO | Admitting: Family Medicine

## 2023-06-18 ENCOUNTER — Encounter: Payer: Self-pay | Admitting: Family Medicine

## 2023-06-18 VITALS — BP 127/85 | HR 83 | Ht 69.0 in | Wt 305.4 lb

## 2023-06-18 DIAGNOSIS — N289 Disorder of kidney and ureter, unspecified: Secondary | ICD-10-CM

## 2023-06-18 NOTE — Progress Notes (Unsigned)
    SUBJECTIVE:   CHIEF COMPLAINT / HPI:    Rodney Lane is a 53yo M w/ hx of T2DM, HTN, CAD, hypertriglyceridemia that p/f kidney dysfunction.  - Denies any new meds over past year, other than starting Mounjaro one month ago. - Was having diarrhea with mounjaro, after the day after injection, but then improves throughout the week. Overall, tolerating med well.  - Reports drinking normally, doesn't think it's dehydration.  - Takes Aleve and advil and tylenol. Reports taking aleve and advil almost everyday.  - Denies hx of GI bleed, blood in stool.   Per chart review: - Seen 12/17, found to have elevated kidney function at that time.  - UACR wnl 6/17. Looks like this has been noted since ~11/13/22. This decline in kidney function was initially attributed to acute viral illness at that time.    OBJECTIVE:   BP 127/85   Pulse 83   Ht 5\' 9"  (1.753 m)   Wt (!) 305 lb 6.4 oz (138.5 kg)   SpO2 99%   BMI 45.10 kg/m   General: Alert, pleasant well-appearing man. NAD. HEENT: NCAT. MMM. CV: RRR, no murmurs.  Resp: CTAB, no wheezing or crackles. Normal WOB on RA.  Abm: Soft, nontender, nondistended. BS present. Ext: Moves all ext spontaneously Skin: Warm, well perfused   ASSESSMENT/PLAN:   Assessment & Plan Renal insufficiency Differential for progressing renal disease includes NSAID toxicity, blood pressure medications, diabetes, hypertension related.  NSAID toxicity is most likely given daily Aleve/Advil use for his chronic back pain.  Less likely blood pressure given normal blood pressure at today's visit.  Less likely diabetes or blood pressure given relative good control of both of these conditions. -Advised to stop all NSAID drugs including Aleve, Advil.  Counseled patient to continue his daily aspirin. -Counseled that gabapentin and Tylenol are safe to use for pain -Will recheck BMP and CBC today -Follow-up in 1 month for repeat BMP.  If it is still elevated after cessation of NSAIDs,  consider nephrology referral   [name], MD [facility]

## 2023-06-18 NOTE — Patient Instructions (Signed)
Good to see you today - Thank you for coming in  Things we discussed today:  1) Your kidney function has been slowly getting worse over the past few months.  I think this is most likely due to you using NSAID drugs daily, such as Aleve, Advil, Motrin, BC powders. These medications can cause kidney injury and stomach ulcers. - Stop taking all NSAID drugs.  - Come back in about 1 month and we will recheck your kidney labs

## 2023-06-19 ENCOUNTER — Encounter: Payer: Self-pay | Admitting: Family Medicine

## 2023-06-19 LAB — CBC
Hematocrit: 38.7 % (ref 37.5–51.0)
Hemoglobin: 12.5 g/dL — ABNORMAL LOW (ref 13.0–17.7)
MCH: 29.3 pg (ref 26.6–33.0)
MCHC: 32.3 g/dL (ref 31.5–35.7)
MCV: 91 fL (ref 79–97)
Platelets: 238 10*3/uL (ref 150–450)
RBC: 4.26 x10E6/uL (ref 4.14–5.80)
RDW: 13 % (ref 11.6–15.4)
WBC: 6.9 10*3/uL (ref 3.4–10.8)

## 2023-06-19 LAB — BASIC METABOLIC PANEL
BUN/Creatinine Ratio: 13 (ref 9–20)
BUN: 20 mg/dL (ref 6–24)
CO2: 22 mmol/L (ref 20–29)
Calcium: 9.9 mg/dL (ref 8.7–10.2)
Chloride: 106 mmol/L (ref 96–106)
Creatinine, Ser: 1.55 mg/dL — ABNORMAL HIGH (ref 0.76–1.27)
Glucose: 105 mg/dL — ABNORMAL HIGH (ref 70–99)
Potassium: 4.5 mmol/L (ref 3.5–5.2)
Sodium: 140 mmol/L (ref 134–144)
eGFR: 53 mL/min/{1.73_m2} — ABNORMAL LOW (ref >59)

## 2023-06-19 NOTE — Assessment & Plan Note (Signed)
Differential for progressing renal disease includes NSAID toxicity, blood pressure medications, diabetes, hypertension related.  NSAID toxicity is most likely given daily Aleve/Advil use for his chronic back pain.  Less likely blood pressure given normal blood pressure at today's visit.  Less likely diabetes or blood pressure given relative good control of both of these conditions. -Advised to stop all NSAID drugs including Aleve, Advil.  Counseled patient to continue his daily aspirin. -Counseled that gabapentin and Tylenol are safe to use for pain -Will recheck BMP and CBC today -Follow-up in 1 month for repeat BMP.  If it is still elevated after cessation of NSAIDs, consider nephrology referral

## 2023-07-17 ENCOUNTER — Encounter: Payer: Self-pay | Admitting: Family Medicine

## 2023-07-17 ENCOUNTER — Ambulatory Visit: Payer: BC Managed Care – PPO | Admitting: Family Medicine

## 2023-07-17 VITALS — BP 120/75 | HR 79 | Ht 69.0 in | Wt 299.4 lb

## 2023-07-17 DIAGNOSIS — R052 Subacute cough: Secondary | ICD-10-CM

## 2023-07-17 DIAGNOSIS — N289 Disorder of kidney and ureter, unspecified: Secondary | ICD-10-CM | POA: Diagnosis not present

## 2023-07-17 DIAGNOSIS — Z23 Encounter for immunization: Secondary | ICD-10-CM | POA: Diagnosis not present

## 2023-07-17 MED ORDER — ALBUTEROL SULFATE HFA 108 (90 BASE) MCG/ACT IN AERS: 2.0000 | INHALATION_SPRAY | Freq: Four times a day (QID) | RESPIRATORY_TRACT | 2 refills | Status: AC | PRN

## 2023-07-17 NOTE — Progress Notes (Signed)
    SUBJECTIVE:   CHIEF COMPLAINT / HPI:   Rodney Lane is a 53 yo M w/ hx of HTN, chronic back pain, T2DM that pf f/u of elevated Cr and new cough.  F/u elevated Cr - Was advised to stop NSAID use at last visit 1 month ago - Reports he has stopped his NSAID  Cough - Has had a cough for past month - He reports that he gets coughing spells that come and go in the past for a while now. - Cough is productive - Has also heard wheezing in his chest occasionally - Does not have asthma that he knows of. But he has had inhalers in the past and said these helped.  - Has sleep apnea.  - Denies heartburn - Does not smoke   OBJECTIVE:   BP 120/75   Pulse 79   Ht 5\' 9"  (1.753 m)   Wt 299 lb 6.4 oz (135.8 kg)   SpO2 97%   BMI 44.21 kg/m   General: Alert, pleasant man. NAD. HEENT: NCAT. MMM. CV: RRR, no murmurs. . Resp: CTAB, no wheezing or crackles. Normal WOB on RA.  Abm: Soft, nontender, nondistended. BS present. Ext: Moves all ext spontaneously Skin: Warm, well perfused   ASSESSMENT/PLAN:    Assessment & Plan Renal insufficiency Has had gradual increase in Cr over past 8 months. Was most recently advised to hold NSAIDs 1 month ago and plan for repeat BMP today. If kidney function not improving, will refer to nephrology.  Subacute cough Differential includes asthma, PNA, COPD, TB, GERD. Asthma is highest on differential given hx of wheezing and prior treatment with inhalers. No TB risk factors and no infectious signs to suggest PNA.  - Start albuterol 2 puffs q6h prn for coughing/wheezing - If not improving, advised to f/u in 2-4 wks    Rodney Brigham, MD [facility]

## 2023-07-17 NOTE — Assessment & Plan Note (Signed)
Differential includes asthma, PNA, COPD, TB, GERD. Asthma is highest on differential given hx of wheezing and prior treatment with inhalers. No TB risk factors and no infectious signs to suggest PNA.  - Start albuterol 2 puffs q6h prn for coughing/wheezing - If not improving, advised to f/u in 2-4 wks

## 2023-07-17 NOTE — Patient Instructions (Addendum)
Good to see you today - Thank you for coming in  Things we discussed today:  1) For your coughing, possible be due to asthma.  Asthma can cause inflammation in your lungs that lead to coughing. -When you get a coughing episode, you can take 2 puffs of albuterol.  You can repeat this every 6 hours as needed. -If you are not getting improvement or need more improvement, please make an appointment in 2 to 4 weeks.  2) we will check your labs today to see if your kidneys are doing well.  If your creatinine is not improving, I may send a referral for you to see a nephrologist (kidney doctor).

## 2023-07-18 LAB — BASIC METABOLIC PANEL
BUN/Creatinine Ratio: 14 (ref 9–20)
BUN: 22 mg/dL (ref 6–24)
CO2: 21 mmol/L (ref 20–29)
Calcium: 10.3 mg/dL — ABNORMAL HIGH (ref 8.7–10.2)
Chloride: 105 mmol/L (ref 96–106)
Creatinine, Ser: 1.55 mg/dL — ABNORMAL HIGH (ref 0.76–1.27)
Glucose: 93 mg/dL (ref 70–99)
Potassium: 4.7 mmol/L (ref 3.5–5.2)
Sodium: 143 mmol/L (ref 134–144)
eGFR: 53 mL/min/{1.73_m2} — ABNORMAL LOW (ref >59)

## 2023-07-19 ENCOUNTER — Encounter: Payer: Self-pay | Admitting: Family Medicine

## 2023-07-19 NOTE — Assessment & Plan Note (Signed)
Has had gradual increase in Cr over past 8 months. Was most recently advised to hold NSAIDs 1 month ago and plan for repeat BMP today. If kidney function not improving, will refer to nephrology.

## 2023-07-23 ENCOUNTER — Other Ambulatory Visit: Payer: Self-pay | Admitting: Family Medicine

## 2023-07-23 NOTE — Telephone Encounter (Signed)
 Called patient to discuss his ongoing symptoms.  Patient reports that he is still having a lot of coughing.  Denies fevers or shortness of breath.  He does not have a thermometer at home.  I advised patient to get a thermometer and check his temperature.  Advised him to report back via MyChart if he is having temperatures of 100.3 or higher.  Also advised for him to reach out if he starts to develop shortness of breath.  I still suspect that this is most likely postviral cough, however given duration of symptoms, could consider a chest x-ray.

## 2023-07-30 ENCOUNTER — Other Ambulatory Visit: Payer: Self-pay | Admitting: Family Medicine

## 2023-07-30 DIAGNOSIS — R000 Tachycardia, unspecified: Secondary | ICD-10-CM

## 2023-08-02 ENCOUNTER — Other Ambulatory Visit: Payer: Self-pay

## 2023-08-03 MED ORDER — METFORMIN HCL ER 500 MG PO TB24: 500.0000 mg | ORAL_TABLET | Freq: Two times a day (BID) | ORAL | 3 refills | Status: AC

## 2023-08-06 ENCOUNTER — Other Ambulatory Visit: Payer: Self-pay | Admitting: Cardiovascular Disease

## 2023-08-13 ENCOUNTER — Other Ambulatory Visit: Payer: Self-pay | Admitting: Cardiovascular Disease

## 2023-08-14 NOTE — Telephone Encounter (Signed)
 Pt of Dr. Eden Emms. Passed his 3rd attempt. Does Dr. Eden Emms want to refill? Please advise.

## 2023-08-23 MED ORDER — LOSARTAN POTASSIUM 100 MG PO TABS: 100.0000 mg | ORAL_TABLET | Freq: Every day | ORAL | 0 refills | Status: DC

## 2023-08-23 NOTE — Telephone Encounter (Signed)
 Called patient and left message to call our office back to make an appointment. Patient did see PharmD last August.

## 2023-08-27 NOTE — Telephone Encounter (Signed)
 Rodney Lane

## 2023-09-19 ENCOUNTER — Other Ambulatory Visit: Payer: Self-pay | Admitting: Cardiovascular Disease

## 2023-09-27 NOTE — Patient Instructions (Signed)
 Below is our plan:  We will switch rizatriptan  to sumatriptan. Please take 1 tablet at onset of headache. May take 1 additional tablet in 2 hours if needed. Do not take more than 2 tablets in 24 hours or more than 10 in a month. You may take Tylenol  1000mg  and Benadryl 25mg  with sumatriptan if needed. Continue topiramate  and gabapentin  as prescribed by PCP.   Please seek emergency medical attention for any unrelieved, worsening or stroke like symptoms as discussed.   Please make sure you are staying well hydrated. I recommend 50-60 ounces daily. Well balanced diet and regular exercise encouraged. Consistent sleep schedule with 6-8 hours recommended.   Please continue follow up with care team as directed.   Follow up with me in 1 year, sooner if needed   You may receive a survey regarding today's visit. I encourage you to leave honest feed back as I do use this information to improve patient care. Thank you for seeing me today!   GENERAL HEADACHE INFORMATION:   Natural supplements: Magnesium Oxide or Magnesium Glycinate 500 mg at bed (up to 800 mg daily) Coenzyme Q10 300 mg in AM Vitamin B2- 200 mg twice a day   Add 1 supplement at a time since even natural supplements can have undesirable side effects. You can sometimes buy supplements cheaper (especially Coenzyme Q10) at www.WebmailGuide.co.za or at [facility].  Migraine with aura: There is increased risk for stroke in women with migraine with aura and a contraindication for the combined contraceptive pill for use by women who have migraine with aura. The risk for women with migraine without aura is lower. However other risk factors like smoking are far more likely to increase stroke risk than migraine. There is a recommendation for no smoking and for the use of OCPs without estrogen such as progestogen only pills particularly for women with migraine with aura.Aaron Aas People who have migraine headaches with auras may be 3 times more likely to have a stroke  caused by a blood clot, compared to migraine patients who don't see auras. Women who take hormone-replacement therapy may be 30 percent more likely to suffer a clot-based stroke than women not taking medication containing estrogen. Other risk factors like smoking and high blood pressure may be  much more important.    Vitamins and herbs that show potential:   Magnesium: Magnesium (250 mg twice a day or 500 mg at bed) has a relaxant effect on smooth muscles such as blood vessels. Individuals suffering from frequent or daily headache usually have low magnesium levels which can be increase with daily supplementation of 400-750 mg. Three trials found 40-90% average headache reduction  when used as a preventative. Magnesium may help with headaches are aura, the best evidence for magnesium is for migraine with aura is its thought to stop the cortical spreading depression we believe is the pathophysiology of migraine aura.Magnesium also demonstrated the benefit in menstrually related migraine.  Magnesium is part of the messenger system in the serotonin cascade and it is a good muscle relaxant.  It is also useful for constipation which can be a side effect of other medications used to treat migraine. Good sources include nuts, whole grains, and tomatoes. Side Effects: loose stool/diarrhea  Riboflavin (vitamin B 2) 200 mg twice a day. This vitamin assists nerve cells in the production of ATP a principal energy storing molecule.  It is necessary for many chemical reactions in the body.  There have been at least 3 clinical trials of riboflavin  using 400 mg per day all of which suggested that migraine frequency can be decreased.  All 3 trials showed significant improvement in over half of migraine sufferers.  The supplement is found in bread, cereal, milk, meat, and poultry.  Most Americans get more riboflavin than the recommended daily allowance, however riboflavin deficiency is not necessary for the supplements to help  prevent headache. Side effects: energizing, green urine   Coenzyme Q10: This is present in almost all cells in the body and is critical component for the conversion of energy.  Recent studies have shown that a nutritional supplement of CoQ10 can reduce the frequency of migraine attacks by improving the energy production of cells as with riboflavin.  Doses of 150 mg twice a day have been shown to be effective.   Melatonin: Increasing evidence shows correlation between melatonin secretion and headache conditions.  Melatonin supplementation has decreased headache intensity and duration.  It is widely used as a sleep aid.  Sleep is natures way of dealing with migraine.  A dose of 3 mg is recommended to start for headaches including cluster headache. Higher doses up to 15 mg has been reviewed for use in Cluster headache and have been used. The rationale behind using melatonin for cluster is that many theories regarding the cause of Cluster headache center around the disruption of the normal circadian rhythm in the brain.  This helps restore the normal circadian rhythm.   HEADACHE DIET: Foods and beverages which may trigger migraine Note that only 20% of headache patients are food sensitive. You will know if you are food sensitive if you get a headache consistently 20 minutes to 2 hours after eating a certain food. Only cut out a food if it causes headaches, otherwise you might remove foods you enjoy! What matters most for diet is to eat a well balanced healthy diet full of vegetables and low fat protein, and to not miss meals.   Chocolate, other sweets ALL cheeses except cottage and cream cheese Dairy products, yogurt, sour cream, ice cream Liver Meat extracts (Bovril, Marmite, meat tenderizers) Meats or fish which have undergone aging, fermenting, pickling or smoking. These include: Hotdogs,salami,Lox,sausage, mortadellas,smoked salmon, pepperoni, Pickled herring Pods of broad bean (English beans,  Chinese pea pods, [place] (fava) beans, lima and navy beans Ripe avocado, ripe banana Yeast extracts or active yeast preparations such as Brewer's or Fleishman's (commercial bakes goods are permitted) Tomato based foods, pizza (lasagna, etc.)   MSG (monosodium glutamate) is disguised as many things; look for these common aliases: Monopotassium glutamate Autolysed yeast Hydrolysed protein Sodium caseinate "flavorings" "all natural preservatives" Nutrasweet   Avoid all other foods that convincingly provoke headaches.   Resources: The Dizzy Althia Jetty Your Headache Diet, migrainestrong.com  https://zamora-andrews.com/   Caffeine and Migraine For patients that have migraine, caffeine intake more than 3 days per week can lead to dependency and increased migraine frequency. I would recommend cutting back on your caffeine intake as best you can. The recommended amount of caffeine is 200-300 mg daily, although migraine patients may experience dependency at even lower doses. While you may notice an increase in headache temporarily, cutting back will be helpful for headaches in the long run. For more information on caffeine and migraine, visit: https://americanmigrainefoundation.org/resource-library/caffeine-and-migraine/   Headache Prevention Strategies:   1. Maintain a headache diary; learn to identify and avoid triggers.  - This can be a simple note where you log when you had a headache, associated symptoms, and medications used - There are several  smartphone apps developed to help track migraines: Migraine Buddy, Migraine Monitor, Curelator N1-Headache App   Common triggers include: Emotional triggers: Emotional/Upset family or friends Emotional/Upset occupation Business reversal/success Anticipation anxiety Crisis-serious Post-crisis periodNew job/position   Physical triggers: Vacation Day Weekend Strenuous Exercise High Altitude  Location New Move Menstrual Day Physical Illness Oversleep/Not enough sleep Weather changes Light: Photophobia or light sesnitivity treatment involves a balance between desensitization and reduction in overly strong input. Use dark polarized glasses outside, but not inside. Avoid bright or fluorescent light, but do not dim environment to the point that going into a normally lit room hurts. Consider FL-41 tint lenses, which reduce the most irritating wavelengths without blocking too much light.  These can be obtained at axonoptics.com or theraspecs.com Foods: see list above.   2. Limit use of acute treatments (over-the-counter medications, triptans, etc.) to no more than 2 days per week or 10 days per month to prevent medication overuse headache (rebound headache).     3. Follow a regular schedule (including weekends and holidays): Don't skip meals. Eat a balanced diet. 8 hours of sleep nightly. Minimize stress. Exercise 30 minutes per day. Being overweight is associated with a 5 times increased risk of chronic migraine. Keep well hydrated and drink 6-8 glasses of water per day.   4. Initiate non-pharmacologic measures at the earliest onset of your headache. Rest and quiet environment. Relax and reduce stress. Breathe2Relax is a free app that can instruct you on    some simple relaxtion and breathing techniques. Http://Dawnbuse.com is a    free website that provides teaching videos on relaxation.  Also, there are  many apps that   can be downloaded for "mindful" relaxation.  An app called YOGA NIDRA will help walk you through mindfulness. Another app called Calm can be downloaded to give you a structured mindfulness guide with daily reminders and skill development. Headspace for guided meditation Mindfulness Based Stress Reduction Online Course: www.palousemindfulness.com Cold compresses.   5. Don't wait!! Take the maximum allowable dosage of prescribed medication at the first sign of  migraine.   6. Compliance:  Take prescribed medication regularly as directed and at the first sign of a migraine.   7. Communicate:  Call your physician when problems arise, especially if your headaches change, increase in frequency/severity, or become associated with neurological symptoms (weakness, numbness, slurred speech, etc.). Proceed to emergency room if you experience new or worsening symptoms or symptoms do not resolve, if you have new neurologic symptoms or if headache is severe, or for any concerning symptom.   8. Headache/pain management therapies: Consider various complementary methods, including medication, behavioral therapy, psychological counselling, biofeedback, massage therapy, acupuncture, dry needling, and other modalities.  Such measures may reduce the need for medications. Counseling for pain management, where patients learn to function and ignore/minimize their pain, seems to work very well.   9. Recommend changing family's attention and focus away from patient's headaches. Instead, emphasize daily activities. If first question of day is 'How are your headaches/Do you have a headache today?', then patient will constantly think about headaches, thus making them worse. Goal is to re-direct attention away from headaches, toward daily activities and other distractions.   10. Helpful Websites: www.AmericanHeadacheSociety.org PatentHood.ch www.headaches.org TightMarket.nl www.achenet.org

## 2023-09-27 NOTE — Progress Notes (Unsigned)
 PATIENT: Rodney Lane DOB: [birth year]  REASON FOR VISIT: follow up HISTORY FROM: patient  Virtual Visit via MyChart video  I connected with [name] on 10/02/23 at 10:00 AM EDT via MyChart video and verified that I am speaking with the correct person using two identifiers.   I discussed the limitations, risks, security and privacy concerns of performing an evaluation and management service by Mychart video and the availability of in person appointments. I also discussed with the patient that there may be a patient responsible charge related to this service. The patient expressed understanding and agreed to proceed.   History of Present Illness:  09/27/23 ALL (Mychart):  Brecken returns for follow up for migraines. He was last seen 09/2022 and doing well on gabapentin  300mg , topiramate  50mg  daily and rizatriptan  as needed. Since, he reports headaches have been well managed. He had increased topiramate  to 50mg  BID per PCP and continues gabapentin  300mg  daily (written by PCP for BID dosing for sciatica). He usually has 4-5 headache days a month. Rizatriptan  usually works very well. He called this morning to report an intractable headache for the past 4 days. He initially requested to cancel appt but accepted virtual visit. He describes the headache as typical for previous migraines. Right sided (did move to left one day) throbbing/pounding pain. Blurred vision. No vision loss. No aura or other stroke like symptoms. Rizatriptan  has helped ease pain but it returns. BP has been normal. CBGs unremarkable. No concerns of illness/allergy symptoms. He continues CPAP nightly followed by Dr Baldwin Levee.   10/02/2022 ALL:  [name] returns for follow up for migraines. He was doing well at last visit 09/2021. He was advised to continue gabapentin  300mg  QHS. PCP had refilled topiramate  50mg  BID but he reported only taking QD. Rizatriptan  continued for abortive therapy. Since, he reports having 4-8 headache days with 4 of  these being migrainous. Rizatriptan  works well for abortive therapy. He averages 2-3 doses per month. He takes gabapentin  as prescribed but did not increase dose of topiramate . He continues CPAP therapy. He has not been seen by Dr Baldwin Levee recently. He continues to work nights.   09/26/2021 ALL: [name] returns for follow up for migraines. We discontinued topiramate  and started gabapentin  300QHS at last visit 03/2021. He switched to daytime as he is working nights. He feels that gabapentin  did seem to help some. He reports having two headache days a month. Both are migrainous. No tension headaches. He does feel rizatriptan  has helped. He is taking OTC analgesics 4-8 times a month. He is followed regularly by Dr Germain Kohler, PCP. He continues verapamil  240mg  daily. BP is well managed. PCP recently refilled topiramate  but he is not certain he is taking it.   03/29/2021 ALL:  [name] is a 53 y.o. male here today for follow up for migraines. He was started on topiramate  50mg  twice daily. He does not feel that this has helped. He continues to have 4-8 headache days per month with 1-2 migrainous headaches. Rizatriptan  has not helped. He has experienced more shock like waves throughout body. Mostly on right side. He was advised to follow up with PCP for concerns of right arm and right thigh numbness. He has not had a chance to check in with PCP. He was given a number to call for PT but has not reached out. He has tried to loosen belt. Pain is worse when at work and standing up, better when lying down. He is using CPAP most every day. He works nights.  HISTORY (copied from Dr Elon Hakim previous note)  53 year old male with headaches, arm pain, leg pain.   Patient reports occipital and right-sided headaches with dizziness, nausea, photophobia and blurred vision for past 5 years, 3-4 times a month.  Patient has family history of migraine in mother.  He has never been officially diagnosed with migraine.  He takes  over-the-counter medications with mild relief.   Patient also having intermittent shooting pains and tingling sensation in right greater than left arms.  Also has right hip pain, numbness in the right thigh, with chronic low back pain since 1993.   Patient also has borderline diabetes, hypertension, hypertriglyceridemia, sleep apnea, obesity, anxiety and depression.   Observations/Objective:  Generalized: Well developed, in no acute distress  Mentation: Alert oriented to time, place, history taking. Follows all commands speech and language fluent   Assessment and Plan:  53 y.o. year old male  has a past medical history of Allergy, Anxiety, Blurry vision, bilateral (08/26/2019), Coronary artery calcification, Diabetes mellitus (HCC), Dilatation of aorta (HCC), GERD (gastroesophageal reflux disease), Hepatic steatosis, HTN (hypertension), Hypertriglyceridemia, Inappropriate sinus tachycardia (HCC), Morbid obesity (HCC), and Sleep apnea. here with    ICD-10-CM   1. Migraine with aura and without status migrainosus, not intractable  G43.109      Sal reports migraines are usually well managed on current treatment plan. Unfortunately, he reports an intractable migraine for the past 4 days. No unusual or worrisome symptoms, today. I have encouraged him to continue topiramate  50mg  twice daily and consider increasing gabapentin  to 300mg  twice daily for 3-5 days. Medications managed by PCP at this time. We will switch abortive care to sumatriptan 100mg  as needed. He was advised of appropriate dosing and possible side effects. May take Tylenol  1000mg  and Benadryl 25mg  with sumatriptan if needed. Adequate hydration advised. Discussed need for emergency attention for any unusual or stroke like symptoms. He will follow up with me in 1 year, sooner if needed. He verbalizes understanding and agreement with this plan.   No orders of the defined types were placed in this encounter.   Meds ordered this  encounter  Medications   SUMAtriptan (IMITREX) 100 MG tablet    Sig: Take 1 tablet (100 mg total) by mouth once as needed for up to 1 dose for migraine. May repeat in 2 hours if headache persists or recurs.    Dispense:  10 tablet    Refill:  11    Supervising Provider:   AHERN, ANTONIA B [[id number]]     Follow Up Instructions:  I discussed the assessment and treatment plan with the patient. The patient was provided an opportunity to ask questions and all were answered. The patient agreed with the plan and demonstrated an understanding of the instructions.   The patient was advised to call back or seek an in-person evaluation if the symptoms worsen or if the condition fails to improve as anticipated.  I provided 20 minutes of face-to-face and non face-to-face time during this MyChart video encounter. Patient located at their place of residence. Provider is in the office.    Garvis Downum, NP

## 2023-10-01 ENCOUNTER — Other Ambulatory Visit: Payer: Self-pay | Admitting: Nephrology

## 2023-10-01 DIAGNOSIS — N1831 Chronic kidney disease, stage 3a: Secondary | ICD-10-CM

## 2023-10-02 ENCOUNTER — Telehealth: Payer: BC Managed Care – PPO | Admitting: Family Medicine

## 2023-10-02 ENCOUNTER — Encounter: Payer: Self-pay | Admitting: Family Medicine

## 2023-10-02 DIAGNOSIS — G43109 Migraine with aura, not intractable, without status migrainosus: Secondary | ICD-10-CM

## 2023-10-02 MED ORDER — SUMATRIPTAN SUCCINATE 100 MG PO TABS: 100.0000 mg | ORAL_TABLET | Freq: Once | ORAL | 11 refills | Status: AC | PRN

## 2023-10-02 NOTE — Telephone Encounter (Signed)
 Called pt. He saw Kidney doctor yesterday. On day 4 migraine. Meds tried: rizatriptan .  Last dose: this morning.  Explained he can take 1 additional dose in 2 hours if not resolved. He verbalized understanding. Does not feel it is effective. Does not feel he can do mychart VV. Can barely keep eyes open, in bed in the dark. I recommended since on day 4 migraine, he should proceed to urgent care or ER for possible migraine cocktail. He preferred to do mychart VV to discuss treatment options with Amy. Changed current appt today at 10am to this. He will log on around 945am. He will make sure he is drinking plenty of fluids and try hot/cold pack.   He has never take steroid pack in the past for migraine.

## 2023-10-08 ENCOUNTER — Ambulatory Visit
Admission: RE | Admit: 2023-10-08 | Discharge: 2023-10-08 | Disposition: A | Discharge status: Home / Self Care | Source: Ambulatory Visit | Attending: Nephrology | Admitting: Nephrology

## 2023-10-08 DIAGNOSIS — N1831 Chronic kidney disease, stage 3a: Secondary | ICD-10-CM

## 2023-10-10 ENCOUNTER — Encounter: Payer: Self-pay | Admitting: Family Medicine

## 2023-10-11 NOTE — Telephone Encounter (Signed)
 Amy did you want patient to continue Rx with PCP?  Per note 10/02/23 " I have encouraged him to continue topiramate  50mg  twice daily and consider increasing gabapentin  to 300mg  twice daily for 3-5 days. Medications managed by PCP at this time."

## 2023-10-12 ENCOUNTER — Encounter: Payer: Self-pay | Admitting: Family Medicine

## 2023-10-16 ENCOUNTER — Other Ambulatory Visit: Payer: Self-pay | Admitting: Student

## 2023-10-19 ENCOUNTER — Other Ambulatory Visit: Payer: Self-pay | Admitting: Family Medicine

## 2023-10-20 MED ORDER — GABAPENTIN 300 MG PO CAPS: 300.0000 mg | ORAL_CAPSULE | Freq: Two times a day (BID) | ORAL | 3 refills | Status: DC

## 2023-10-26 ENCOUNTER — Other Ambulatory Visit: Payer: Self-pay | Admitting: Family Medicine

## 2023-10-29 ENCOUNTER — Telehealth: Payer: Self-pay

## 2023-10-29 NOTE — Telephone Encounter (Signed)
 FMLA forms found in RN box.   Copy made and placed in batch scanning.   Faxed to number provided on paperwork.   Elsie Halo, RN

## 2023-11-03 ENCOUNTER — Other Ambulatory Visit: Payer: Self-pay | Admitting: Family Medicine

## 2023-11-03 DIAGNOSIS — G43909 Migraine, unspecified, not intractable, without status migrainosus: Secondary | ICD-10-CM

## 2023-11-06 ENCOUNTER — Encounter: Payer: Self-pay | Admitting: Family Medicine

## 2023-11-06 ENCOUNTER — Ambulatory Visit: Admitting: Family Medicine

## 2023-11-06 VITALS — BP 112/63 | HR 81 | Ht 69.0 in | Wt 296.2 lb

## 2023-11-06 DIAGNOSIS — G8929 Other chronic pain: Secondary | ICD-10-CM

## 2023-11-06 DIAGNOSIS — E119 Type 2 diabetes mellitus without complications: Secondary | ICD-10-CM

## 2023-11-06 DIAGNOSIS — M5441 Lumbago with sciatica, right side: Secondary | ICD-10-CM

## 2023-11-06 DIAGNOSIS — M5442 Lumbago with sciatica, left side: Secondary | ICD-10-CM

## 2023-11-06 LAB — POCT GLYCOSYLATED HEMOGLOBIN (HGB A1C): HbA1c, POC (controlled diabetic range): 6.7 % (ref 0.0–7.0)

## 2023-11-06 MED ORDER — GABAPENTIN 300 MG PO CAPS: 300.0000 mg | ORAL_CAPSULE | Freq: Two times a day (BID) | ORAL | 3 refills | Status: DC

## 2023-11-06 NOTE — Progress Notes (Signed)
    SUBJECTIVE:   CHIEF COMPLAINT / HPI:   Rodney Lane is a 53yo M w/ hx of T2DM, low back pain w/ sciatica, CKD that pf sciatica pain. - Aunt passed away and he had to go to funeral last week. - Reports that his sciatica pain has been acting up - He has been out of his gabapentin  and needs refill.  - Pt has seen nephrology. They have done initial assessment and plan to f/u in a few months.  OBJECTIVE:   BP 112/63   Pulse 81   Ht 5' 9 (1.753 m)   Wt 296 lb 3.2 oz (134.4 kg)   SpO2 100%   BMI 43.74 kg/m   General: Alert, pleasant man. NAD. HEENT: NCAT. MMM. CV: RRR, no murmurs. Resp: CTAB, no wheezing or crackles. Normal WOB on RA.  Abm: Soft, nontender, nondistended. BS present. Ext: Moves all ext spontaneously Skin: Warm, well perfused   ASSESSMENT/PLAN:   Assessment & Plan Chronic low back pain with bilateral sciatica, unspecified back pain laterality Chronic and uncontrolled. Will restart gabapentin , advised pt to reach out if prescription is not at pharmacy.  - Counseled on regular exercise to help with pain Type 2 diabetes mellitus without complication, without long-term current use of insulin (HCC) Well controlled; will check A1c today. - cont metformin    Advised to reach out to GI doctor to see when they want him to get repeat colonoscopy.   Albin Huh, MD [facility]

## 2023-11-06 NOTE — Patient Instructions (Signed)
 Good to see you today - Thank you for coming in  Things we discussed today:  1) I have sent a refill of your gabapentin . If you have any trouble getting it filled, please let us  know.   2) Regular exercise can help improve blood flow to your legs and descrease irritation to your nerves.  3) Reach out to your GI doctor to see when they recommend you to get a repeat colonoscopy.    4) I will recheck your A1c today.   Come back to see me in 6 months

## 2023-11-08 ENCOUNTER — Ambulatory Visit: Payer: Self-pay | Admitting: Family Medicine

## 2023-11-08 NOTE — Assessment & Plan Note (Signed)
 Well controlled; will check A1c today. - cont metformin 

## 2023-11-08 NOTE — Assessment & Plan Note (Signed)
 Chronic and uncontrolled. Will restart gabapentin , advised pt to reach out if prescription is not at pharmacy.  - Counseled on regular exercise to help with pain

## 2023-11-26 NOTE — Progress Notes (Signed)
 Cardiology Office Note   Date:  12/03/2023   ID:  Rodney Lane, DOB February 18, 1971, MRN [id number]  PCP:  Elicia Hamlet, MD  Cardiologist:  Dr. Delford    No chief complaint on file.     History of Present Illness: Rodney Lane is a 53 y.o. male who presents for follow up lipids   Chest pain, HTN, obesity OSA on CPAP   Admitted to [facility] 11/07/2018 with acute onset of worsened SOB, CP, palpitations.  In the ER noted to be in ST 150's treated initially with adenosine  that had no effect, then IVF, ASA and NTG, rates improved and symptoms improved as well.  CT chest negative for PE, his COVD test here negative, no obvious signs of infection, afebrile, WBC wnl. He also had a TSH that was wnl. CRP and sed rate elevated.  Hgb A1c 6.8.     Echo with LVEF 50-55%, no WMA described, +impaired relaxation, no pericardial effusion.  Pt declined stress test secondary to bad past experience, body habitus and tachycardia precludes coronary CT   He was seen by Dr. Inocencio and placed on dilt 240 mg daily.  Hydrate.   Suspected ST alone.  BB stopped due to weakness and fatigue.     11/25/18 still felt bad.He had lexiscan  myoview  neg for ischemia, then had appt with Dr. Inocencio  And dilt stopped and verapamil  ER 360 started and losartan  50 mg daily.  Pt still with SOB so PFTs and pulmonary consult were made. PFTls reviewed 01/02/19 were normal with minimal small airway dx Getting his CPAP arranged through Dr Shelah   He has multiple somatic complaints that all seem to be related to being out of shape and overweight with chronic back pain and right sided sciatica   06/28/20 Clonidine  started for BP in addition to losartan  and verapamil . Diuretic not given due to history of Cr 1.5  Working at [employer] in [place]. Discussed utility of bariatric surgical evaluation  Long discussion with him again about weight loss and bariatric surgery     Past Medical History:  Diagnosis Date   Allergy    thru the year    Anxiety    Blurry  vision, bilateral 08/26/2019   Coronary artery calcification    Diabetes mellitus (HCC)    borderline- no meds    Dilatation of aorta (HCC)    GERD (gastroesophageal reflux disease)    past hx- not current    Hepatic steatosis    HTN (hypertension)    Hypertriglyceridemia    Inappropriate sinus tachycardia (HCC)    Morbid obesity (HCC)    Sleep apnea    wears cpap nightly     Past Surgical History:  Procedure Laterality Date   COLONOSCOPY     2 past colons- 1 civilian, 1 military - normal x 2 per pt    WISDOM TOOTH EXTRACTION Bilateral    2000     Current Outpatient Medications  Medication Sig Dispense Refill   albuterol  (VENTOLIN  HFA) 108 (90 Base) MCG/ACT inhaler Inhale 2 puffs into the lungs every 6 (six) hours as needed for wheezing or shortness of breath. 8 g 2   ASPIRIN  LOW DOSE 81 MG tablet TAKE 1 TABLET (81 MG TOTAL) BY MOUTH DAILY. SWALLOW WHOLE. 90 tablet 3   cetirizine  (ZYRTEC ) 10 MG tablet TAKE 1 TABLET BY MOUTH EVERY DAY AS NEEDED FOR ALLERGY 30 tablet 11   cloNIDine  (CATAPRES ) 0.1 MG tablet TAKE 1 TABLET BY MOUTH 2 TIMES DAILY. 180 tablet  3   fenofibrate  (TRICOR ) 145 MG tablet Take 1 tablet (145 mg total) by mouth daily. 90 tablet 3   fluticasone  (FLONASE ) 50 MCG/ACT nasal spray SPRAY 2 SPRAYS INTO EACH NOSTRIL EVERY DAY 48 mL 2   gabapentin  (NEURONTIN ) 300 MG capsule Take 1 capsule (300 mg total) by mouth 2 (two) times daily. 180 capsule 3   losartan  (COZAAR ) 100 MG tablet TAKE 1 TABLET BY MOUTH EVERY DAY 30 tablet 2   metFORMIN  (GLUCOPHAGE -XR) 500 MG 24 hr tablet Take 1 tablet (500 mg total) by mouth 2 (two) times daily with a meal. 180 tablet 3   metoprolol  tartrate (LOPRESSOR ) 25 MG tablet TAKE 1 TABLET BY MOUTH TWICE A DAY 180 tablet 3   rizatriptan  (MAXALT -MLT) 10 MG disintegrating tablet TAKE 1 TABLET BY MOUTH AS NEEDED FOR MIGRAINE. MAY REPEAT IN 2 HOURS IF NEEDED 9 tablet 11   rosuvastatin  (CRESTOR ) 40 MG tablet TAKE 1 TABLET BY MOUTH EVERY DAY 90 tablet 2    sertraline  (ZOLOFT ) 25 MG tablet Take 25 mg by mouth daily.     SUMAtriptan  (IMITREX ) 100 MG tablet Take 1 tablet (100 mg total) by mouth once as needed for up to 1 dose for migraine. May repeat in 2 hours if headache persists or recurs. 10 tablet 11   topiramate  (TOPAMAX ) 50 MG tablet TAKE 1 TABLET BY MOUTH TWICE A DAY 180 tablet 3   verapamil  (CALAN -SR) 240 MG CR tablet TAKE 1 TABLET BY MOUTH EVERYDAY AT BEDTIME 90 tablet 3   No current facility-administered medications for this visit.    Allergies:   Patient has no known allergies.    Social History:  The patient  reports that he has never smoked. He has never used smokeless tobacco. He reports that he does not drink alcohol and does not use drugs.   Family History:  The patient's family history includes Cancer in his father and mother; Colon cancer in his father; Diabetes in his father; Heart disease in his maternal grandfather and paternal grandfather.    ROS:  General:no colds or fevers, + weight increase  Skin:no rashes or ulcers HEENT:no blurred vision, no congestion CV:see HPI PUL:see HPI GI:no diarrhea constipation or melena, no indigestion GU:no hematuria, no dysuria, erectile dysfunction new for pt  MS:no joint pain, no claudication Neuro:no syncope, no lightheadedness Endo:+ diabetes has not been treated, no PCP HgbA1C is 6.8 , no thyroid disease  Wt Readings from Last 3 Encounters:  12/03/23 294 lb 9.6 oz (133.6 kg)  11/06/23 296 lb 3.2 oz (134.4 kg)  07/17/23 299 lb 6.4 oz (135.8 kg)     PHYSICAL EXAM: VS:  BP 120/82   Pulse 82   Ht 5' 9 (1.753 m)   Wt 294 lb 9.6 oz (133.6 kg)   SpO2 95%   BMI 43.50 kg/m  , BMI Body mass index is 43.5 kg/m.  Affect appropriate Overweight black male  HEENT: normal Neck supple with no adenopathy JVP normal no bruits no thyromegaly Lungs clear with no wheezing and good diaphragmatic motion Heart:  S1/S2 no murmur, no rub, gallop or click PMI normal Abdomen: benighn,  BS positve, no tenderness, no AAA no bruit.  No HSM or HJR Distal pulses intact with no bruits No edema Neuro non-focal Skin warm and dry No muscular weakness    EKG:   12/03/2023 SR rate 81 normal    Recent Labs: 06/18/2023: Hemoglobin 12.5; Platelets 238 07/17/2023: BUN 22; Creatinine, Ser 1.55; Potassium 4.7; Sodium 143  Lipid Panel    Component Value Date/Time   CHOL 162 11/13/2022 1718   TRIG 238 (H) 11/13/2022 1718   HDL 33 (L) 11/13/2022 1718   CHOLHDL 4.9 11/13/2022 1718   CHOLHDL 4.6 06/28/2020 1417   VLDL UNABLE TO CALCULATE IF TRIGLYCERIDE OVER 400 mg/dL 98/68/7977 8582   LDLCALC 89 11/13/2022 1718   LDLDIRECT 90 12/13/2020 1222   LDLDIRECT 70.6 06/28/2020 1417       Other studies Reviewed: Additional studies/ records that were reviewed today include: . Echo 11/07/18 IMPRESSIONS      1. The left ventricle has low normal systolic function, with an ejection fraction of 50-55%. The cavity size was normal. There is mildly increased left ventricular wall thickness. Left ventricular diastolic Doppler parameters are consistent with  impaired relaxation.  2. The right ventricle has normal systolic function. The cavity was normal. There is no increase in right ventricular wall thickness.  3. The aortic valve was not well visualized.   FINDINGS  Left Ventricle: The left ventricle has low normal systolic function, with an ejection fraction of 50-55%. The cavity size was normal. There is mildly increased left ventricular wall thickness. Left ventricular diastolic Doppler parameters are consistent  with impaired relaxation.   Right Ventricle: The right ventricle has normal systolic function. The cavity was normal. There is no increase in right ventricular wall thickness.   Left Atrium: Left atrial size was normal in size.   Right Atrium: Right atrial size was normal in size. Right atrial pressure is estimated at 10 mmHg.   Interatrial Septum: No atrial level shunt  detected by color flow Doppler.   Pericardium: There is no evidence of pericardial effusion.   Mitral Valve: The mitral valve was not well visualized. Mitral valve regurgitation is not visualized by color flow Doppler.   Tricuspid Valve: The tricuspid valve is normal in structure. Tricuspid valve regurgitation was not visualized by color flow Doppler.   Aortic Valve: The aortic valve was not well visualized Aortic valve regurgitation was not visualized by color flow Doppler. There is no evidence of aortic valve stenosis.   Pulmonic Valve: The pulmonic valve was normal in structure. Pulmonic valve regurgitation is not visualized by color flow Doppler.   Venous: The inferior vena cava is normal in size with greater than 50% respiratory variability.   nuc study 11/27/18   Study Highlights    Nuclear stress EF: 37%. Generalized hypokinesis noted. Dilated left ventricle. There was no ST segment deviation noted during stress. There was no transient ischemic dilatation. This is an intermediate risk study based upon reduction in ejection fraction. There were no perfusion defects identified suggestive of nonischemic cardiomyopathy.        ASSESSMENT AND PLAN:  1.  Chest pain with neg lexiscan  myoview  done 11/27/18  Recommended coronary calcium  score to further risk stratify.   2.  Dyspnea:  Non cardiac EF 60-65% by echo 03/01/20 no valve disease or evidence of pulmonary HTN BNP was normal on 12/19/18  CTA negative for PE on 11/07/18  CXR NAD 11/13/22   3.  HTN on ARB, calcium  blocker and clonidine  improved   4.   CKD 2 need to monitor with ARB Cr is 1.55 07/17/23   5.  DM-2 Discussed low carb diet.  Target hemoglobin A1c is 6.5 or less.  Continue current medications. A1c 6.7 11/06/23   6.  Tachycardia improved  followed by Dr. Inocencio   7.  OSA:  Start CPAP per Dr Shelah  8.  Lipids:started on fish oil with fibrates for elevated triglycerides  on crestor  for lipids. Update labs At one point  triglycerides were 997 a year ago down to 238 and fish oil added. Vascepa  and Lovaza  triggered migraines   9. Migraines: takes gabapentin , topiramate  and rizatriptan  as needed   10. Obesity:  refer to weight loss clinic discussed bariatric surgery on Mounjaro  for last 2 months   Lipid/liver  Refer Weight loss Clinic [facility]  FU with me in a year   Signed, Maude Emmer, MD  12/03/2023 9:15 AM    [facility] [address] [place], [place], [place]  [address]/ [address] [place], [place] Phone: [phone]; Fax: [phone]  [phone]

## 2023-12-03 ENCOUNTER — Ambulatory Visit: Attending: Cardiovascular Disease | Admitting: Cardiovascular Disease

## 2023-12-03 ENCOUNTER — Encounter: Payer: Self-pay | Admitting: Cardiovascular Disease

## 2023-12-03 ENCOUNTER — Other Ambulatory Visit: Payer: Self-pay

## 2023-12-03 VITALS — BP 120/82 | HR 82 | Ht 69.0 in | Wt 294.6 lb

## 2023-12-03 DIAGNOSIS — I10 Essential (primary) hypertension: Secondary | ICD-10-CM

## 2023-12-03 DIAGNOSIS — E782 Mixed hyperlipidemia: Secondary | ICD-10-CM

## 2023-12-03 DIAGNOSIS — R000 Tachycardia, unspecified: Secondary | ICD-10-CM | POA: Diagnosis not present

## 2023-12-03 DIAGNOSIS — E781 Pure hyperglyceridemia: Secondary | ICD-10-CM | POA: Diagnosis not present

## 2023-12-03 NOTE — Patient Instructions (Addendum)
 Medication Instructions:  Your physician recommends that you continue on your current medications as directed. Please refer to the Current Medication list given to you today.  *If you need a refill on your cardiac medications before your next appointment, please call your pharmacy*  Lab Work: Your physician recommends that you return for lab work when you are fasting for at least 8 hours for a lipid and liver panel  If you have labs (blood work) drawn today and your tests are completely normal, you will receive your results only by: MyChart Message (if you have MyChart) OR A paper copy in the mail If you have any lab test that is abnormal or we need to change your treatment, we will call you to review the results.  Testing/Procedures: None ordered today.  Follow-Up: At [facility], you and your health needs are our priority.  As part of our continuing mission to provide you with exceptional heart care, our providers are all part of one team.  This team includes your primary Cardiologist (physician) and Advanced Practice Providers or APPs (Physician Assistants and Nurse Practitioners) who all work together to provide you with the care you need, when you need it.  Your next appointment:   1 year(s)  Provider:   Maude Emmer, MD    We recommend signing up for the patient portal called MyChart.  Sign up information is provided on this After Visit Summary.  MyChart is used to connect with patients for Virtual Visits (Telemedicine).  Patients are able to view lab/test results, encounter notes, upcoming appointments, etc.  Non-urgent messages can be sent to your provider as well.   To learn more about what you can do with MyChart, go to [email or web address].

## 2023-12-16 ENCOUNTER — Other Ambulatory Visit: Payer: Self-pay | Admitting: Cardiovascular Disease

## 2023-12-18 ENCOUNTER — Ambulatory Visit: Payer: Self-pay | Admitting: Cardiovascular Disease

## 2023-12-18 DIAGNOSIS — E781 Pure hyperglyceridemia: Secondary | ICD-10-CM

## 2023-12-18 DIAGNOSIS — E782 Mixed hyperlipidemia: Secondary | ICD-10-CM

## 2023-12-18 LAB — LIPID PANEL
Chol/HDL Ratio: 6.4 ratio — ABNORMAL HIGH (ref 0.0–5.0)
Cholesterol, Total: 197 mg/dL (ref 100–199)
HDL: 31 mg/dL — ABNORMAL LOW (ref >39)
LDL Chol Calc (NIH): 128 mg/dL — ABNORMAL HIGH (ref 0–99)
Triglycerides: 211 mg/dL — ABNORMAL HIGH (ref 0–149)
VLDL Cholesterol Cal: 38 mg/dL (ref 5–40)

## 2023-12-18 LAB — HEPATIC FUNCTION PANEL
ALT: 17 IU/L (ref 0–44)
AST: 20 IU/L (ref 0–40)
Albumin: 4.3 g/dL (ref 3.8–4.9)
Alkaline Phosphatase: 37 IU/L — ABNORMAL LOW (ref 44–121)
Bilirubin Total: 0.2 mg/dL (ref 0.0–1.2)
Bilirubin, Direct: 0.13 mg/dL (ref 0.00–0.40)
Total Protein: 7 g/dL (ref 6.0–8.5)

## 2024-01-13 ENCOUNTER — Other Ambulatory Visit: Payer: Self-pay | Admitting: Family Medicine

## 2024-01-13 DIAGNOSIS — E781 Pure hyperglyceridemia: Secondary | ICD-10-CM

## 2024-01-14 ENCOUNTER — Encounter: Payer: Self-pay | Admitting: Family Medicine

## 2024-01-26 ENCOUNTER — Other Ambulatory Visit: Payer: Self-pay | Admitting: Cardiovascular Disease

## 2024-01-29 NOTE — Progress Notes (Unsigned)
    SUBJECTIVE:   CHIEF COMPLAINT / HPI:   Rodney Lane is a 53yo M that pf ED concerns. - This has been ongoing for years - When he achieves an erection, it goes away after a minute. He has difficulty maintaining and achieving.  - Feels like he doesn't achieve erections for weeks at a time - He is working on his stress and its improving. He feels like his sleep is improving. - He wonders if his medication - Reports no morning erections - Denies nocturia. - he has tried viagra  in the past, but it gives him headaches. He also tried cialis , which worked and he tolerated well. He ran out of refills.  - does not smoke  PERTINENT  PMH / PSH: HTN, CAD, T2DM  OBJECTIVE:   BP 125/79   Pulse 81   Ht 5' 9 (1.753 m)   Wt 278 lb 3.2 oz (126.2 kg)   SpO2 96%   BMI 41.08 kg/m   General: Alert, pleasant man. NAD. HEENT: NCAT. MMM. CV: RRR, no murmurs. Cap refill <2. Resp: CTAB, no wheezing or crackles. Normal WOB on RA.  Abm: Soft, nontender, nondistended. BS present. Ext: Moves all ext spontaneously Skin: Warm, well perfused   ASSESSMENT/PLAN:   Assessment & Plan Erectile dysfunction, unspecified erectile dysfunction type - Start cialis  5mg  prn daily - Counseled on lifestyle modifications and cardiovascular health Type 2 diabetes mellitus without complication, without long-term current use of insulin (HCC) UACR today Encounter for immunization      Twyla Nearing, MD [facility]

## 2024-01-30 ENCOUNTER — Ambulatory Visit (INDEPENDENT_AMBULATORY_CARE_PROVIDER_SITE_OTHER): Admitting: Family Medicine

## 2024-01-30 ENCOUNTER — Encounter: Payer: Self-pay | Admitting: Family Medicine

## 2024-01-30 VITALS — BP 125/79 | HR 81 | Ht 69.0 in | Wt 278.2 lb

## 2024-01-30 DIAGNOSIS — Z23 Encounter for immunization: Secondary | ICD-10-CM

## 2024-01-30 DIAGNOSIS — N529 Male erectile dysfunction, unspecified: Secondary | ICD-10-CM

## 2024-01-30 DIAGNOSIS — E119 Type 2 diabetes mellitus without complications: Secondary | ICD-10-CM | POA: Diagnosis not present

## 2024-01-30 MED ORDER — TADALAFIL 5 MG PO TABS: 5.0000 mg | ORAL_TABLET | Freq: Every day | ORAL | 2 refills | Status: DC | PRN

## 2024-01-30 NOTE — Assessment & Plan Note (Signed)
UACR today

## 2024-01-30 NOTE — Patient Instructions (Signed)
 Good to see you today - Thank you for coming in  Things we discussed today:  1) We will start cialis  for your ED. Take 5mg  an hour before intercourse. - Only take 1 a day - Side effects include dizziness, low blood pressure. Do not take it with any nitroglycerin  medicines.   2) ED can be worsened by poor cardiovascular health. Continue working on your diabetes, hypertension, and high cholesterol. Incorporating aerobic exercise can help improve your cardiovascular health too.

## 2024-01-31 LAB — MICROALBUMIN / CREATININE URINE RATIO
Creatinine, Urine: 166.4 mg/dL
Microalb/Creat Ratio: 2 mg/g{creat} (ref 0–29)
Microalbumin, Urine: 3.4 ug/mL

## 2024-02-21 ENCOUNTER — Telehealth: Payer: Self-pay

## 2024-02-21 ENCOUNTER — Telehealth: Payer: Self-pay | Admitting: Family Medicine

## 2024-02-21 NOTE — Telephone Encounter (Signed)
 Placed in MDs box to be filled out. Charee Tumblin [place], CMA

## 2024-02-21 NOTE — Telephone Encounter (Signed)
 FMLA paperwork found in RN box.   After review, paperwork requires patient signature.   Called patient and advised of required signature.   He will come to front office to sign, he is then requesting that we fax to the provided number.   Copy of provider completed paperwork has been made and placed in batch scanning.   Placed forms at front desk for signature.   Chiquita JAYSON English, RN

## 2024-02-21 NOTE — Telephone Encounter (Signed)
 Patient dropped off form at front desk for [facility].  Verified that patient section of form has been completed.  Last DOS/WCC with PCP was 09/03/20258.  Placed form in red team folder to be completed by clinical staff.  Rodney Lane

## 2024-02-26 NOTE — Telephone Encounter (Signed)
 Patient LVM on nurse line asking if FMLA paperwork has been sent to his employer.   Per chart review, on 02/21/24, I had discussed with patient that he needed to sign his portion prior to us  being able to fax this back to employer.   Called patient back. He states that he came in and signed the paperwork and handed everything back to front office staff.   Patient is requesting that we fax this over to [facility] ASAP.   Will forward to Dr. Zheng for further advisement.   Chiquita JAYSON English, RN

## 2024-02-28 NOTE — Telephone Encounter (Signed)
 Spoke with Dr. Zheng yesterday. He had paperwork and we were able to fax to provided number. ROI signed.   Copy made and placed in batch scanning.   Called patient and provided with update. He states that [employer] confirmed they received yesterday.   Chiquita JAYSON English, RN

## 2024-03-27 ENCOUNTER — Other Ambulatory Visit: Payer: Self-pay

## 2024-03-27 MED ORDER — ROSUVASTATIN CALCIUM 40 MG PO TABS: 40.0000 mg | ORAL_TABLET | Freq: Every day | ORAL | 2 refills | Status: AC

## 2024-04-21 ENCOUNTER — Encounter: Payer: Self-pay | Admitting: Family Medicine

## 2024-04-30 ENCOUNTER — Other Ambulatory Visit: Payer: Self-pay | Admitting: Family Medicine

## 2024-04-30 DIAGNOSIS — N529 Male erectile dysfunction, unspecified: Secondary | ICD-10-CM

## 2024-05-05 ENCOUNTER — Ambulatory Visit: Admitting: Family Medicine

## 2024-05-05 ENCOUNTER — Other Ambulatory Visit: Payer: Self-pay | Admitting: Family Medicine

## 2024-05-05 DIAGNOSIS — G8929 Other chronic pain: Secondary | ICD-10-CM

## 2024-05-13 ENCOUNTER — Encounter: Payer: Self-pay | Admitting: Family Medicine

## 2024-05-13 ENCOUNTER — Ambulatory Visit: Admitting: Family Medicine

## 2024-05-13 VITALS — BP 110/70 | HR 74 | Ht 69.0 in | Wt 282.0 lb

## 2024-05-13 DIAGNOSIS — M5442 Lumbago with sciatica, left side: Secondary | ICD-10-CM | POA: Diagnosis not present

## 2024-05-13 DIAGNOSIS — G8929 Other chronic pain: Secondary | ICD-10-CM | POA: Diagnosis not present

## 2024-05-13 DIAGNOSIS — Z23 Encounter for immunization: Secondary | ICD-10-CM | POA: Diagnosis not present

## 2024-05-13 DIAGNOSIS — M5441 Lumbago with sciatica, right side: Secondary | ICD-10-CM | POA: Diagnosis not present

## 2024-05-13 MED ORDER — PREDNISONE 20 MG PO TABS: 40.0000 mg | ORAL_TABLET | Freq: Every day | ORAL | 0 refills | Status: AC

## 2024-05-13 NOTE — Progress Notes (Unsigned)
° ° °  SUBJECTIVE:   CHIEF COMPLAINT / HPI:   ***  A1c,   Sciatica: Increase gabapentin  to TID    CKD: -Start jardiance    Discussed the use of AI scribe software for clinical note transcription with the patient, who gave verbal consent to proceed.  History of Present Illness Rodney Lane is a 53 year old male who presents with worsening sciatic nerve pain.  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 - Pain described as burning in the right thigh, severe on the side, and shooting down to the feet with needle-like sensations - Pain coincides with back pain and occasionally extends to the head, contributing to headaches or migraines - Pain severity has increased to the point of causing tears after work; coworkers have noticed his discomfort - Current pain management includes Tylenol ; gabapentin  provides relief but causes excessive sleepiness, preventing use during work - No incontinence  Occupational impact - Employed in current job for nearly five years, involving heavy lifting and prolonged standing on concrete floors - Supportive colleagues and management willing to accommodate health needs  Hypertension - History of high blood pressure, previously concerning but currently stable - No history of heart attacks - Under care of a cardiologist     PERTINENT  PMH / PSH: lumbar DDD w/ sciatica, HTN  OBJECTIVE:   BP 110/70   Pulse 74   Ht 5' 9 (1.753 m)   Wt 282 lb (127.9 kg)   SpO2 94%   BMI 41.64 kg/m   General: Alert, pleasant man. NAD. HEENT: NCAT. MMM. CV: RRR, no murmurs.   Resp: CTAB, no wheezing or crackles. Normal WOB on RA.   Ext: TTP along BL paraspinal lumbar region. Limping gait due to pain. Strength intact and symmetric in BL LE.   ASSESSMENT/PLAN:   Assessment & Plan Chronic low back pain with bilateral sciatica, unspecified back pain  laterality      Twyla Nearing, MD [facility]

## 2024-05-13 NOTE — Patient Instructions (Signed)
 1) For the pain, we will do a short burst of steroids to help with inflammation. This is only able to be used for 3 days safely. - Take prednisone  40mg  daily with food for 3 days. This can make your blood sugar go up.  2) I am ordering a MRI of your back. They will call you to schedule that.  3) I am referring you to see a neurosurgeon. They can assess you for potential surgery or steroid injections. They will call you to schedule.

## 2024-05-15 NOTE — Assessment & Plan Note (Addendum)
 Uncontrolled and progressively worsening over past year. Pain control is limited by renal function. No red flag signs such as incontinence, weakness, numbness. MRI lumbar 2023 showed mild DDD and stenosis. Given progression of symptoms, will repeat MRI. - Short course of prednisone  40mg  daily for x3days - f/u MRI lumbar - Referral to NSGY for further evaluation for surgery and/or steroid injections - Cont tylenol  and gabapentin . Unable to increase gabapentin  due to drowsiness side effects

## 2024-05-23 ENCOUNTER — Other Ambulatory Visit: Payer: Self-pay | Admitting: Family Medicine

## 2024-06-03 ENCOUNTER — Encounter: Payer: Self-pay | Admitting: Family Medicine

## 2024-06-04 ENCOUNTER — Ambulatory Visit (HOSPITAL_COMMUNITY)

## 2024-06-09 ENCOUNTER — Encounter: Payer: Self-pay | Admitting: Family Medicine

## 2024-06-17 NOTE — Progress Notes (Unsigned)
 "  Referring Physician:  Madelon Donald HERO, DO [address] [place],  [place] [address]  Primary Physician:  Elicia Hamlet, MD  History of Present Illness: 06/17/2024 Rodney Lane is here today with a chief complaint of ***  Chronic low back pain with bilateral sciatica burning in the right thigh  Pain shooting down to the feet with needle-like sensations    Duration: *** Location: *** Quality: *** Severity: ***  Precipitating: aggravated by *** Modifying factors: made better by *** Weakness: none Timing: *** Bowel/Bladder Dysfunction: none  Conservative measures:  Physical therapy: *** Has not participated in? Multimodal medical therapy including regular antiinflammatories: *** Tylenol ; gabapentin , prednisone   Injections: *** epidural steroid injections?  Past Surgery: ***  Rodney Lane has ***no symptoms of cervical myelopathy.  The symptoms are causing a significant impact on the patient's life.   Review of Systems:  A 10 point review of systems is negative, except for the pertinent positives and negatives detailed in the HPI.  Past Medical History: Past Medical History:  Diagnosis Date   Allergy    thru the year    Anxiety    Blurry vision, bilateral 08/26/2019   Coronary artery calcification    Diabetes mellitus (HCC)    borderline- no meds    Dilatation of aorta    GERD (gastroesophageal reflux disease)    past hx- not current    Hepatic steatosis    HTN (hypertension)    Hypertriglyceridemia    Inappropriate sinus tachycardia    Morbid obesity (HCC)    Sleep apnea    wears cpap nightly     Past Surgical History: Past Surgical History:  Procedure Laterality Date   COLONOSCOPY     2 past colons- 1 civilian, 1 military - normal x 2 per pt    WISDOM TOOTH EXTRACTION Bilateral    2000    Allergies: Allergies as of 06/18/2024   (No Known Allergies)    Medications: Outpatient Encounter Medications as of 06/18/2024  Medication Sig    albuterol  (VENTOLIN  HFA) 108 (90 Base) MCG/ACT inhaler Inhale 2 puffs into the lungs every 6 (six) hours as needed for wheezing or shortness of breath.   ASPIRIN  LOW DOSE 81 MG tablet TAKE 1 TABLET (81 MG TOTAL) BY MOUTH DAILY. SWALLOW WHOLE.   cetirizine  (ZYRTEC ) 10 MG tablet TAKE 1 TABLET BY MOUTH EVERY DAY AS NEEDED FOR ALLERGY   cloNIDine  (CATAPRES ) 0.1 MG tablet TAKE 1 TABLET BY MOUTH TWICE A DAY   fenofibrate  (TRICOR ) 145 MG tablet TAKE 1 TABLET BY MOUTH EVERY DAY   fluticasone  (FLONASE ) 50 MCG/ACT nasal spray SPRAY 2 SPRAYS INTO EACH NOSTRIL EVERY DAY   gabapentin  (NEURONTIN ) 300 MG capsule TAKE 1 CAPSULE BY MOUTH TWICE A DAY   losartan  (COZAAR ) 100 MG tablet TAKE 1 TABLET BY MOUTH EVERY DAY   metFORMIN  (GLUCOPHAGE -XR) 500 MG 24 hr tablet Take 1 tablet (500 mg total) by mouth 2 (two) times daily with a meal.   metoprolol  tartrate (LOPRESSOR ) 25 MG tablet TAKE 1 TABLET BY MOUTH TWICE A DAY   predniSONE  (DELTASONE ) 20 MG tablet Take 2 tablets (40 mg total) by mouth daily with breakfast.   rizatriptan  (MAXALT -MLT) 10 MG disintegrating tablet TAKE 1 TABLET BY MOUTH AS NEEDED FOR MIGRAINE. MAY REPEAT IN 2 HOURS IF NEEDED   rosuvastatin  (CRESTOR ) 40 MG tablet Take 1 tablet (40 mg total) by mouth daily.   sertraline  (ZOLOFT ) 25 MG tablet Take 25 mg by mouth daily.   SUMAtriptan  (  IMITREX ) 100 MG tablet Take 1 tablet (100 mg total) by mouth once as needed for up to 1 dose for migraine. May repeat in 2 hours if headache persists or recurs.   tadalafil  (CIALIS ) 5 MG tablet TAKE 1 TABLET BY MOUTH DAILY AS NEEDED FOR ERECTILE DYSFUNCTION   topiramate  (TOPAMAX ) 50 MG tablet TAKE 1 TABLET BY MOUTH TWICE A DAY   verapamil  (CALAN -SR) 240 MG CR tablet TAKE 1 TABLET BY MOUTH EVERYDAY AT BEDTIME   No facility-administered encounter medications on file as of 06/18/2024.    Social History: Social History[1]  Family Medical History: Family History  Problem Relation Age of Onset   Cancer Mother    Cancer  Father    Diabetes Father    Colon cancer Father        dx'd in his 33's , died in 76's    Heart disease Maternal Grandfather        diagnosed 63s   Heart disease Paternal Grandfather        diagnosed 68s   Colon polyps Neg Hx    Esophageal cancer Neg Hx    Rectal cancer Neg Hx    Stomach cancer Neg Hx     Physical Examination: @VITALWITHPAIN @  General: Patient is well developed, well nourished, calm, collected, and in no apparent distress. Attention to examination is appropriate.  Psychiatric: Patient is non-anxious.  Head:  Pupils equal, round, and reactive to light.  ENT:  Oral mucosa appears well hydrated.  Neck:   Supple.  ***Full range of motion.  Respiratory: Patient is breathing without any difficulty.  Extremities: No edema.  Vascular: Palpable dorsal pedal pulses.  Skin:   On exposed skin, there are no abnormal skin lesions.  NEUROLOGICAL:     Awake, alert, oriented to person, place, and time.  Speech is clear and fluent. Fund of knowledge is appropriate.   Cranial Nerves: Pupils equal round and reactive to light.  Facial tone is symmetric.  Facial sensation is symmetric.  ROM of spine: ***full.  Palpation of spine: ***non tender.    Strength: Side Biceps Triceps Deltoid Interossei Grip Wrist Ext. Wrist Flex.  R 5 5 5 5 5 5 5   L 5 5 5 5 5 5 5    Side Iliopsoas Quads Hamstring PF DF EHL  R 5 5 5 5 5 5   L 5 5 5 5 5 5    Reflexes are ***2+ and symmetric at the biceps, triceps, brachioradialis, patella and achilles.   Hoffman's is absent.  Clonus is not present.  Toes are down-going.  Bilateral upper and lower extremity sensation is intact to light touch.    Gait is normal.   No difficulty with tandem gait.   No evidence of dysmetria noted.  Medical Decision Making  Imaging: ***  I have personally reviewed the images and agree with the above interpretation.  Assessment and Plan: Rodney Lane is a pleasant 54 y.o. male with ***    Thank you for  involving me in the care of this patient.   I spent a total of *** minutes in both face-to-face and non-face-to-face activities for this visit on the date of this encounter.   Lyle Decamp, PA-C Dept. of Neurosurgery     [1]  Social History Tobacco Use   Smoking status: Never   Smokeless tobacco: Never  Vaping Use   Vaping status: Never Used  Substance Use Topics   Alcohol use: Never   Drug use: Never   "

## 2024-06-18 ENCOUNTER — Ambulatory Visit: Admitting: Physician Assistant

## 2024-06-24 ENCOUNTER — Ambulatory Visit: Admitting: Physician Assistant

## 2024-07-23 ENCOUNTER — Ambulatory Visit: Admitting: Physician Assistant

## 2024-10-07 ENCOUNTER — Ambulatory Visit: Admitting: Diagnostic Neuroimaging

## 2024-10-08 ENCOUNTER — Ambulatory Visit: Admitting: Family Medicine
